# Patient Record
Sex: Female | Born: 1960
Health system: Southern US, Community
[De-identification: ages and names within clinical notes are randomized; demographics above are authoritative.]

## PROBLEM LIST (undated history)

## (undated) DIAGNOSIS — I639 Cerebral infarction, unspecified: Secondary | ICD-10-CM

## (undated) DIAGNOSIS — S065X9A Traumatic subdural hemorrhage with loss of consciousness of unspecified duration, initial encounter: Secondary | ICD-10-CM

## (undated) DIAGNOSIS — I69322 Dysarthria following cerebral infarction: Secondary | ICD-10-CM

## (undated) DIAGNOSIS — Z85118 Personal history of other malignant neoplasm of bronchus and lung: Secondary | ICD-10-CM

## (undated) DIAGNOSIS — E785 Hyperlipidemia, unspecified: Secondary | ICD-10-CM

## (undated) DIAGNOSIS — Z85841 Personal history of malignant neoplasm of brain: Secondary | ICD-10-CM

## (undated) DIAGNOSIS — C801 Malignant (primary) neoplasm, unspecified: Secondary | ICD-10-CM

## (undated) DIAGNOSIS — Z87898 Personal history of other specified conditions: Secondary | ICD-10-CM

## (undated) DIAGNOSIS — I69398 Other sequelae of cerebral infarction: Secondary | ICD-10-CM

## (undated) DIAGNOSIS — G4730 Sleep apnea, unspecified: Secondary | ICD-10-CM

## (undated) DIAGNOSIS — Z8719 Personal history of other diseases of the digestive system: Secondary | ICD-10-CM

## (undated) DIAGNOSIS — R569 Unspecified convulsions: Secondary | ICD-10-CM

## (undated) DIAGNOSIS — I10 Essential (primary) hypertension: Secondary | ICD-10-CM

## (undated) DIAGNOSIS — I5189 Other ill-defined heart diseases: Secondary | ICD-10-CM

## (undated) DIAGNOSIS — Z8553 Personal history of malignant neoplasm of renal pelvis: Secondary | ICD-10-CM

## (undated) DIAGNOSIS — R269 Unspecified abnormalities of gait and mobility: Secondary | ICD-10-CM

## (undated) HISTORY — DX: Hyperlipidemia, unspecified: E78.5

## (undated) HISTORY — DX: Sleep apnea, unspecified: G47.30

## (undated) HISTORY — DX: Unspecified abnormalities of gait and mobility: R26.9

## (undated) HISTORY — PX: KIDNEY SURGERY: SHX687

## (undated) HISTORY — PX: LEG SURGERY: SHX1003

## (undated) HISTORY — DX: Personal history of malignant neoplasm of renal pelvis: Z85.53

## (undated) HISTORY — DX: Dysarthria following cerebral infarction: I69.322

## (undated) HISTORY — PX: BRAIN SURGERY: SHX531

## (undated) HISTORY — DX: Personal history of malignant neoplasm of brain: Z85.841

## (undated) HISTORY — DX: Cerebral infarction, unspecified: I63.9

## (undated) HISTORY — PX: OTHER SURGICAL HISTORY: SHX169

## (undated) HISTORY — DX: Other sequelae of cerebral infarction: I69.398

## (undated) HISTORY — DX: Malignant (primary) neoplasm, unspecified: C80.1

## (undated) HISTORY — DX: Personal history of other malignant neoplasm of bronchus and lung: Z85.118

## (undated) HISTORY — DX: Other ill-defined heart diseases: I51.89

## (undated) HISTORY — DX: Essential (primary) hypertension: I10

## (undated) HISTORY — DX: Personal history of other specified conditions: Z87.898

## (undated) HISTORY — DX: Traumatic subdural hemorrhage with loss of consciousness of unspecified duration, initial encounter: S06.5X9A

---
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
## 1996-10-21 DIAGNOSIS — Z8553 Personal history of malignant neoplasm of renal pelvis: Secondary | ICD-10-CM

## 1996-10-21 HISTORY — DX: Personal history of malignant neoplasm of renal pelvis: Z85.53

## 2000-10-21 DIAGNOSIS — Z87898 Personal history of other specified conditions: Secondary | ICD-10-CM

## 2000-10-21 DIAGNOSIS — Z8589 Personal history of malignant neoplasm of other organs and systems: Secondary | ICD-10-CM

## 2000-10-21 HISTORY — PX: LUNG LOBECTOMY: SHX167

## 2000-10-21 HISTORY — DX: Personal history of other specified conditions: Z87.898

## 2000-10-21 HISTORY — DX: Personal history of malignant neoplasm of other organs and systems: Z85.89

## 2001-10-21 DIAGNOSIS — Z85841 Personal history of malignant neoplasm of brain: Secondary | ICD-10-CM

## 2001-10-21 HISTORY — DX: Personal history of malignant neoplasm of brain: Z85.841

## 2011-10-07 LAB — HM PAP SMEAR: HM Pap smear: NORMAL

## 2011-12-03 LAB — HM MAMMOGRAPHY

## 2012-08-04 LAB — HM DEXA SCAN

## 2012-11-06 DIAGNOSIS — C7931 Secondary malignant neoplasm of brain: Secondary | ICD-10-CM | POA: Insufficient documentation

## 2012-11-06 DIAGNOSIS — C7800 Secondary malignant neoplasm of unspecified lung: Secondary | ICD-10-CM | POA: Insufficient documentation

## 2012-11-06 DIAGNOSIS — C659 Malignant neoplasm of unspecified renal pelvis: Secondary | ICD-10-CM | POA: Insufficient documentation

## 2014-01-25 LAB — HM MAMMOGRAPHY

## 2014-04-20 DIAGNOSIS — S065X9A Traumatic subdural hemorrhage with loss of consciousness of unspecified duration, initial encounter: Secondary | ICD-10-CM

## 2014-04-20 DIAGNOSIS — S065XAA Traumatic subdural hemorrhage with loss of consciousness status unknown, initial encounter: Secondary | ICD-10-CM

## 2014-04-20 HISTORY — DX: Traumatic subdural hemorrhage with loss of consciousness of unspecified duration, initial encounter: S06.5X9A

## 2014-04-20 HISTORY — DX: Traumatic subdural hemorrhage with loss of consciousness status unknown, initial encounter: S06.5XAA

## 2014-05-16 DIAGNOSIS — S065XAA Traumatic subdural hemorrhage with loss of consciousness status unknown, initial encounter: Secondary | ICD-10-CM | POA: Insufficient documentation

## 2014-10-21 DIAGNOSIS — R569 Unspecified convulsions: Secondary | ICD-10-CM

## 2014-10-21 HISTORY — DX: Unspecified convulsions: R56.9

## 2015-02-16 LAB — BASIC METABOLIC PANEL
Creatinine: 0.9 mg/dL (ref 0.5–1.1)
Glucose: 95 mg/dL
Potassium: 4.8 mmol/L (ref 3.4–5.3)
Sodium: 146 mmol/L (ref 137–147)

## 2015-02-16 LAB — CBC AND DIFFERENTIAL
Hemoglobin: 12.7 g/dL (ref 12.0–16.0)
Platelets: 247 10*3/uL (ref 150–399)
WBC: 4.6 10^3/mL

## 2015-02-16 LAB — LIPID PANEL
Cholesterol: 287 mg/dL — AB (ref 0–200)
HDL: 80 mg/dL — AB (ref 35–70)
LDL Cholesterol: 185 mg/dL
Triglycerides: 108 mg/dL (ref 40–160)

## 2015-02-16 LAB — HEPATIC FUNCTION PANEL
ALT: 24 U/L (ref 7–35)
AST: 21 U/L (ref 13–35)

## 2015-03-22 DIAGNOSIS — I639 Cerebral infarction, unspecified: Secondary | ICD-10-CM

## 2015-03-22 HISTORY — DX: Cerebral infarction, unspecified: I63.9

## 2015-04-21 ENCOUNTER — Encounter: Payer: Self-pay | Admitting: Family Medicine

## 2015-04-21 ENCOUNTER — Ambulatory Visit (INDEPENDENT_AMBULATORY_CARE_PROVIDER_SITE_OTHER): Payer: BLUE CROSS/BLUE SHIELD | Admitting: Family Medicine

## 2015-04-21 VITALS — BP 105/69 | HR 103 | Ht 66.0 in | Wt 156.0 lb

## 2015-04-21 DIAGNOSIS — E854 Organ-limited amyloidosis: Secondary | ICD-10-CM | POA: Insufficient documentation

## 2015-04-21 DIAGNOSIS — G4733 Obstructive sleep apnea (adult) (pediatric): Secondary | ICD-10-CM

## 2015-04-21 DIAGNOSIS — Z8679 Personal history of other diseases of the circulatory system: Secondary | ICD-10-CM | POA: Insufficient documentation

## 2015-04-21 DIAGNOSIS — G40909 Epilepsy, unspecified, not intractable, without status epilepticus: Secondary | ICD-10-CM | POA: Insufficient documentation

## 2015-04-21 DIAGNOSIS — S065X9A Traumatic subdural hemorrhage with loss of consciousness of unspecified duration, initial encounter: Secondary | ICD-10-CM

## 2015-04-21 DIAGNOSIS — S065XAA Traumatic subdural hemorrhage with loss of consciousness status unknown, initial encounter: Secondary | ICD-10-CM

## 2015-04-21 DIAGNOSIS — G40509 Epileptic seizures related to external causes, not intractable, without status epilepticus: Secondary | ICD-10-CM

## 2015-04-21 DIAGNOSIS — I639 Cerebral infarction, unspecified: Secondary | ICD-10-CM

## 2015-04-21 DIAGNOSIS — I6200 Nontraumatic subdural hemorrhage, unspecified: Secondary | ICD-10-CM

## 2015-04-21 DIAGNOSIS — I680 Cerebral amyloid angiopathy: Secondary | ICD-10-CM | POA: Diagnosis not present

## 2015-04-21 DIAGNOSIS — C688 Malignant neoplasm of overlapping sites of urinary organs: Secondary | ICD-10-CM

## 2015-04-21 DIAGNOSIS — E785 Hyperlipidemia, unspecified: Secondary | ICD-10-CM

## 2015-04-21 DIAGNOSIS — C689 Malignant neoplasm of urinary organ, unspecified: Secondary | ICD-10-CM

## 2015-04-21 DIAGNOSIS — Z8669 Personal history of other diseases of the nervous system and sense organs: Secondary | ICD-10-CM | POA: Insufficient documentation

## 2015-04-21 NOTE — Progress Notes (Signed)
CC: Jessica Ritter is a 54 y.o. female is here for hospital f/u   Subjective: HPI:  Very pleasant 54 year old here to [facility] follow-up for CVA infarction that occurred last month. On MRI she was found to have amyloid angiopathy which puts her at risk for both bleeding and infarctions. She was started on a low-dose of aspirin and recommended by her neurologist to be on no stronger anticoagulation due to this angiopathy. LDL cholesterol was considerably elevated and she was started on 80 mg of atorvastatin, she was not on any cholesterol medication prior to this. She and her husband both believe that she is back to her baseline prior to the CVA and that new motor and sensory disturbances resolved by the day of discharge. Carotid ultrasound was unremarkable and echocardiogram showed no PFO.  She has unfortunate history of a subdural hematoma after a fall in 2015. She's been left with epilepsy following this hematoma that is currently well controlled on Keppra. She is currently getting speech therapy, occupational therapy and physical therapy. Family believes that she is progressing however very slowly. She is independent in ADLs but requires her husband supervision around the clock. Ever since this hematoma she's been left with slow speech, slow cognition, and balance difficulty.  When admitted for her recent stroke she was found to have a AHI of 13. Her hospitalist arrange an outpatient sleep study which is still pending to be scheduled.  Hyperlipidemia: She is currently taking atorvastatin 80 mg on a daily basis without any right upper quadrant pain or myalgias.  Around 2004 she was found to have urothelial cancer that had metastasized to her lungs and her brain. She currently sees her oncologist on a annual basis. She's had no sign of recurrence for the past 14 years. Denies any urinary complaints other than occasional urinary incontinence when sneezing forcefully.  Review of  Systems - General ROS: negative for - chills, fever, night sweats, weight gain or weight loss Ophthalmic ROS: negative for - decreased vision Psychological ROS: negative for - anxiety or depression ENT ROS: negative for - hearing change, nasal congestion, tinnitus or allergies Hematological and Lymphatic ROS: negative for - bleeding problems, bruising or swollen lymph nodes Breast ROS: negative Respiratory ROS: no cough, shortness of breath, or wheezing Cardiovascular ROS: no chest pain or dyspnea on exertion Gastrointestinal ROS: no abdominal pain, change in bowel habits, or black or bloody stools Genito-Urinary ROS: negative for - genital discharge, genital ulcers, incontinence or abnormal bleeding from genitals Musculoskeletal ROS: negative for - joint pain or muscle pain Neurological ROS: negative for - headaches or memory loss Dermatological ROS: negative for lumps, mole changes, rash and skin lesion changes  Past Medical History  Diagnosis Date  . Hyperlipidemia     Past Surgical History  Procedure Laterality Date  . Leg surgery    . Kidney surgery    . Brain tumor surgery     Family History  Problem Relation Age of Onset  . Cancer      mother     History   Social History  . Marital Status: Married    Spouse Name: N/A  . Number of Children: N/A  . Years of Education: N/A   Occupational History  . Not on file.   Social History Main Topics  . Smoking status: Never Smoker   . Smokeless tobacco: Not on file  . Alcohol Use: 1.2 oz/week    2 Standard drinks or equivalent per week  . Drug  Use: No  . Sexual Activity: No   Other Topics Concern  . Not on file   Social History Narrative  . No narrative on file     Objective: BP 105/69 mmHg  Pulse 103  Ht 5\' 6"  (1.676 m)  Wt 156 lb (70.761 kg)  BMI 25.19 kg/m2  General: Alert and Oriented, No Acute Distress HEENT: Pupils equal, round, reactive to light. Conjunctivae clear.  Moist mucous membranes pharynx  unremarkable Lungs: Clear to auscultation bilaterally, no wheezing/ronchi/rales.  Comfortable work of breathing. Good air movement. Cardiac: Regular rate and rhythm. Normal S1/S2.  No murmurs, rubs, nor gallops.  No carotid bruit Abdomen: Soft nontender Extremities: No peripheral edema.  Strong peripheral pulses.  Mental Status: No depression, anxiety, nor agitation. Skin: Warm and dry.  Assessment & Plan: Jessica Ritter was seen today for hospital f/u.  Diagnoses and all orders for this visit:  Cerebral amyloid angiopathy  Subdural hematoma  Epileptic seizures due to external causes, not intractable, without status epilepticus  OSA (obstructive sleep apnea)  Hyperlipidemia  Cerebral infarction due to unspecified mechanism  Urothelial cancer   Cerebral amyloid angiopathy: Agree with 81 mg dose of aspirin especially in the setting of a recent ischemic stroke. This will be further managed by Dr. Lynnette Caffey her neurologist Subdural hematoma: Resolved and currently getting appropriate rehabilitation, agree that it's far enough out to start aspirin given recent ischemic stroke. Seizures: Controlled on Keppra OSA: Pending sleep study, offered to order this if she does not get word of scheduling through [place] within the next couple weeks. Hyperlipidemia: Agree with high-dose atorvastatin, I be happy to recheck cholesterol in 2-1/2 months. Original LDL was 174 Urothelial cancer: Currently managed by her oncologist. Discussed Keagle exercises to help with stress incontinence   Return in about 3 months (around 07/22/2015) for Cholesterol.

## 2015-05-09 ENCOUNTER — Encounter: Payer: Self-pay | Admitting: Family Medicine

## 2015-05-09 DIAGNOSIS — M8580 Other specified disorders of bone density and structure, unspecified site: Secondary | ICD-10-CM | POA: Insufficient documentation

## 2015-05-10 ENCOUNTER — Telehealth: Payer: Self-pay | Admitting: Family Medicine

## 2015-05-10 NOTE — Telephone Encounter (Signed)
Patient is not being followed by the doctor that originally prescribed Paxil 10 mg so she needs a refill sent over.   [facility] nurse at [facility].  thanks

## 2015-05-11 MED ORDER — PAROXETINE HCL 10 MG PO TABS: 10.0000 mg | ORAL_TABLET | Freq: Every day | ORAL | Status: DC

## 2015-05-11 NOTE — Telephone Encounter (Signed)
Dr Ileene Rubens is out the rest of this week. Sent 30 day to pharmacy.

## 2015-05-12 ENCOUNTER — Other Ambulatory Visit: Payer: Self-pay

## 2015-05-12 MED ORDER — PANTOPRAZOLE SODIUM 40 MG PO TBEC: 40.0000 mg | DELAYED_RELEASE_TABLET | Freq: Every day | ORAL | Status: DC

## 2015-05-16 ENCOUNTER — Encounter: Payer: Self-pay | Admitting: Family Medicine

## 2015-05-17 ENCOUNTER — Encounter: Payer: Self-pay | Admitting: *Deleted

## 2015-05-17 ENCOUNTER — Encounter: Payer: Self-pay | Admitting: Family Medicine

## 2015-05-24 ENCOUNTER — Encounter: Payer: Self-pay | Admitting: *Deleted

## 2015-06-02 ENCOUNTER — Telehealth: Payer: Self-pay | Admitting: *Deleted

## 2015-06-02 NOTE — Telephone Encounter (Signed)
[name], a PT from [facility], left vm wanting to know if she could have a verbal order to extend PT for 3 more weeks.  Please advise.

## 2015-06-02 NOTE — Telephone Encounter (Signed)
Amber, Yes of course, will you please extend PT for another three weeks.

## 2015-06-02 NOTE — Telephone Encounter (Signed)
VO given to [place] from [facility] to extend PT for 3 more weeks.

## 2015-06-08 ENCOUNTER — Other Ambulatory Visit: Payer: Self-pay | Admitting: Family Medicine

## 2015-06-20 ENCOUNTER — Encounter: Payer: Self-pay | Admitting: Family Medicine

## 2015-06-20 ENCOUNTER — Ambulatory Visit (INDEPENDENT_AMBULATORY_CARE_PROVIDER_SITE_OTHER): Payer: BLUE CROSS/BLUE SHIELD | Admitting: Family Medicine

## 2015-06-20 ENCOUNTER — Telehealth: Payer: Self-pay | Admitting: *Deleted

## 2015-06-20 VITALS — BP 128/88 | HR 108 | Wt 154.0 lb

## 2015-06-20 DIAGNOSIS — R4182 Altered mental status, unspecified: Secondary | ICD-10-CM | POA: Diagnosis not present

## 2015-06-20 NOTE — Progress Notes (Signed)
CC: Jessica Ritter is a 54 y.o. female is here for confusion and mobility regression   Subjective: HPI:  Husband, patient, physical therapist has noticed that the patient has been having periods of progression with respect to physical therapy. Twice now she has been able to walk with a normal gait for matter weeks only to all of a sudden regress to a shuffling gait. She's also been acting a little bit confused when around her husband. For instance she is not recognizing familiar locations. Did not recognize her own house in the last week. Patient has been acting more forgetful. Patient denies any pain or any memory issues. There's been no new motor or sensory disturbances. Patient was started on Paxil after her stroke sometime around July. She was not experiencing any depression or anxiety. The family wonders if she actually needs to be on this medication. There's been no fevers, chills, shortness of breath, cough, abdominal pain, falls, diarrhea or genitourinary complaints. Symptoms are present on a daily basis at least a mild degree.   Review Of Systems Outlined In HPI  Past Medical History  Diagnosis Date  . Hyperlipidemia     Past Surgical History  Procedure Laterality Date  . Leg surgery    . Kidney surgery    . Brain tumor surgery     Family History  Problem Relation Age of Onset  . Cancer      mother     Social History   Social History  . Marital Status: Married    Spouse Name: N/A  . Number of Children: N/A  . Years of Education: N/A   Occupational History  . Not on file.   Social History Main Topics  . Smoking status: Never Smoker   . Smokeless tobacco: Not on file  . Alcohol Use: 1.2 oz/week    2 Standard drinks or equivalent per week  . Drug Use: No  . Sexual Activity: No   Other Topics Concern  . Not on file   Social History Narrative     Objective: BP 128/88 mmHg  Pulse 108  Wt 154 lb (69.854 kg)  General: Alert and Oriented, No Acute  Distress HEENT: Pupils equal, round, reactive to light. Conjunctivae clear.  Moist mucous membranes Neuro: CN II-XII grossly intact, full strength/rom of all four extremities, C5/L4/S1 DTRs 2/4 bilaterally, mild shuffling gait, rapid alternating movements normal, heel-shin test normal, Rhomberg normal. Lungs: Clear to auscultation bilaterally, no wheezing/ronchi/rales.  Comfortable work of breathing. Good air movement. Cardiac: Regular rate and rhythm. Normal S1/S2.  No murmurs, rubs, nor gallops.   Extremities: No peripheral edema.  Strong peripheral pulses.  Mental Status: No depression, anxiety, nor agitation. Skin: Warm and dry.  Assessment & Plan: Jessica Ritter was seen today for confusion and mobility regression.  Diagnoses and all orders for this visit:  Altered mental status, unspecified altered mental status type -     Urinalysis, Routine w reflex microscopic -     Urine culture -     COMPLETE METABOLIC PANEL WITH GFR -     TSH   Altered mental status: Rule out UTI, metabolic abnormality or thyroid dysfunction. Stopping Paxil. Will call tomorrow with further recommendations.  Return if symptoms worsen or fail to improve.

## 2015-06-20 NOTE — Telephone Encounter (Signed)
Pt Jessica Ritter from [facility]. She states she had been doing Pt with her and it was going well however over the past few days she has regressed. She states she is having intermittent confusion. Also regression in functional mobility. Neurologist preferred her to been seen by PCP first. PT does not  [name] would like her to be evaluated. Pt does have an appt on Oct 3 but she feels she needs to be seen sooner. Pt did not want to push her if there was an underlying issue. Husband just walked in the office and was informed by Luellen Pucker that pt needs an appt and to see if a sooner appt could be scheduled. Please advise if you have any other advice in the meant time of if you feel something else she be done more immediate

## 2015-06-21 LAB — URINALYSIS, MICROSCOPIC ONLY
Bacteria, UA: NONE SEEN [HPF]
Casts: NONE SEEN [LPF]
Crystals: NONE SEEN [HPF]
Squamous Epithelial / LPF: NONE SEEN [HPF] (ref <=5)
WBC, UA: NONE SEEN WBC/HPF (ref <=5)
Yeast: NONE SEEN [HPF]

## 2015-06-21 LAB — URINALYSIS, ROUTINE W REFLEX MICROSCOPIC
Bilirubin Urine: NEGATIVE
Glucose, UA: NEGATIVE
Ketones, ur: NEGATIVE
Leukocytes, UA: NEGATIVE
Nitrite: NEGATIVE
Protein, ur: NEGATIVE
Specific Gravity, Urine: 1.021 (ref 1.001–1.035)
pH: 5 (ref 5.0–8.0)

## 2015-06-21 LAB — TSH

## 2015-06-22 LAB — URINE CULTURE
Colony Count: NO GROWTH
Organism ID, Bacteria: NO GROWTH

## 2015-07-05 ENCOUNTER — Telehealth: Payer: Self-pay | Admitting: *Deleted

## 2015-07-05 MED ORDER — LEVETIRACETAM 750 MG PO TABS: 750.0000 mg | ORAL_TABLET | Freq: Two times a day (BID) | ORAL | Status: DC

## 2015-07-05 NOTE — Telephone Encounter (Signed)
[facility] sent to the [facility] here in [place].

## 2015-07-05 NOTE — Telephone Encounter (Signed)
Received a refill request from [facility] for Keppra. Have to route since this has not been rx'ed by this office yet

## 2015-07-07 ENCOUNTER — Telehealth: Payer: Self-pay | Admitting: *Deleted

## 2015-07-07 NOTE — Telephone Encounter (Signed)
Jessica Ritter from [facility] left a message stating she feels pt needs more specific neurological rehab than what she can provide in the home. Pt has a very high copay with home health and the physical therapist   feels she is not benefiting them as much any more. Jessica Ritter feels the pt would benefit more from an outpatient neurologial

## 2015-07-11 NOTE — Telephone Encounter (Signed)
[name], I'm not aware of any local neuro-rehab locations, can you please call our PT office next door and see if they know of any locations [place]?

## 2015-07-12 NOTE — Telephone Encounter (Signed)
Jessica Ritter in PT is not aware... She says Comp Rehab in [place] may be option.  Called [name] with [facility] and left a message on her vm. [phone]

## 2015-07-17 NOTE — Telephone Encounter (Signed)
Called and left a message for Mr. Jessica Ritter to let us know if any neurological outpatient rehab has been set up for pt. I have not heard back from [facility] and she had mentioned in previous conversation that she did know of some rehab places in the area

## 2015-07-24 ENCOUNTER — Encounter: Payer: Self-pay | Admitting: Family Medicine

## 2015-07-24 ENCOUNTER — Other Ambulatory Visit: Payer: Self-pay | Admitting: Family Medicine

## 2015-07-24 ENCOUNTER — Ambulatory Visit (INDEPENDENT_AMBULATORY_CARE_PROVIDER_SITE_OTHER): Payer: BLUE CROSS/BLUE SHIELD

## 2015-07-24 ENCOUNTER — Ambulatory Visit (INDEPENDENT_AMBULATORY_CARE_PROVIDER_SITE_OTHER): Payer: BLUE CROSS/BLUE SHIELD | Admitting: Family Medicine

## 2015-07-24 VITALS — BP 119/62 | HR 76 | Wt 159.0 lb

## 2015-07-24 DIAGNOSIS — Z8673 Personal history of transient ischemic attack (TIA), and cerebral infarction without residual deficits: Secondary | ICD-10-CM

## 2015-07-24 DIAGNOSIS — I639 Cerebral infarction, unspecified: Secondary | ICD-10-CM | POA: Diagnosis not present

## 2015-07-24 DIAGNOSIS — M25512 Pain in left shoulder: Secondary | ICD-10-CM

## 2015-07-24 DIAGNOSIS — E785 Hyperlipidemia, unspecified: Secondary | ICD-10-CM

## 2015-07-24 NOTE — Progress Notes (Signed)
CC: Jessica Ritter is a 54 y.o. female is here for Follow-up   Subjective: HPI:  Left shoulder pain for the last 2 or 3 weeks on a daily basis located in the anterior aspect of the shoulder and nonradiating. Symptoms are worse with any elevation of the arm beyond 90 of abduction or flexion. Symptoms started soon after doing strenuous exercises with her physical therapist. She would like to continue pursuing physical therapist but no longer with advanced home care. Nothing else seems to make symptoms better or worse other than that described above.  She also believes that her gait is getting a little bit better but they've reached a plateau. Developing a second opinion on treatment with a different physical therapist.  Since stopping Paxil she's noticed that she is much more clear headed, does not feel sluggish and overall just feels better without the medication. She denies any anxiety or depression.  It's been about 3 months now since she's been on atorvastatin. She denies any known side effects. She's not had her cholesterol checked since starting this medication. Denies right upper quadrant pain or myalgias   Review Of Systems Outlined In HPI  Past Medical History  Diagnosis Date  . Hyperlipidemia     Past Surgical History  Procedure Laterality Date  . Leg surgery    . Kidney surgery    . Brain tumor surgery     Family History  Problem Relation Age of Onset  . Cancer      mother     Social History   Social History  . Marital Status: Married    Spouse Name: N/A  . Number of Children: N/A  . Years of Education: N/A   Occupational History  . Not on file.   Social History Main Topics  . Smoking status: Never Smoker   . Smokeless tobacco: Not on file  . Alcohol Use: 1.2 oz/week    2 Standard drinks or equivalent per week  . Drug Use: No  . Sexual Activity: No   Other Topics Concern  . Not on file   Social History Narrative     Objective: BP 119/62 mmHg  Pulse  76  Wt 159 lb (72.122 kg)  General: Alert and Oriented, No Acute Distress HEENT: Pupils equal, round, reactive to light. Conjunctivae clear.  Moist mucous membranes Lungs: Clear to auscultation bilaterally, no wheezing/ronchi/rales.  Comfortable work of breathing. Good air movement. Cardiac: Regular rate and rhythm. Normal S1/S2.  No murmurs, rubs, nor gallops.   Left shoulder exam reveals full range of motion and strength in all planes of motion and with individual rotator cuff testing. No overlying redness warmth or swelling.  Neer's test negative.  Hawkins test positive. Empty can positive. Crossarm test negative. O'Brien's test negative. Apprehension test negative. Speed's test negative. Extremities: No peripheral edema.  Strong peripheral pulses.  Mental Status: No depression, anxiety, nor agitation. Skin: Warm and dry.  Assessment & Plan: Jessica Ritter was seen today for follow-up.  Diagnoses and all orders for this visit:  Cerebral infarction due to unspecified mechanism -     Lipid panel -     COMPLETE METABOLIC PANEL WITH GFR -     Ambulatory referral to Home Health  Hyperlipidemia -     Lipid panel -     COMPLETE METABOLIC PANEL WITH GFR  Left shoulder pain -     DG Shoulder Left; Future -     Ambulatory referral to Home Health   Hyperlipidemia: Due for repeat  lipid panel and liver function. Continue atorvastatin pending results. Left shoulder pain: Obtain x-ray to ensure that it safe for her to continue doing physical therapy. I suspect that this is coming from a rotator cuff tear or tendinitis, hold off on exercise until x-rays are back, will arrange physical therapy with another home health agency if x-ray is reassuring.  25 minutes spent face-to-face during visit today of which at least 50% was counseling or coordinating care regarding: 1. Cerebral infarction due to unspecified mechanism   2. Hyperlipidemia   3. Left shoulder pain      Return in about 3 months (around  10/24/2015).

## 2015-08-03 ENCOUNTER — Telehealth: Payer: Self-pay

## 2015-08-03 ENCOUNTER — Other Ambulatory Visit: Payer: Self-pay

## 2015-08-03 DIAGNOSIS — M25512 Pain in left shoulder: Secondary | ICD-10-CM

## 2015-08-03 DIAGNOSIS — I639 Cerebral infarction, unspecified: Secondary | ICD-10-CM

## 2015-08-03 NOTE — Telephone Encounter (Signed)
Jessica Ritter's husband called office stating that Jessica Ritter is still having pain in the arm.  He also called wanting to know her lab results.

## 2015-08-03 NOTE — Telephone Encounter (Signed)
error 

## 2015-08-06 NOTE — Telephone Encounter (Signed)
[name] or [name], Will you please contact [facility] to see why lab results are not available as of 08/06/2015 and update the me and the patient's husband.  Will you also make sure that the patient/family is aware that [facility] home health will be helping with physical therapy on Monday for her continued arm pain.  For future reference please check the Epic "Labs" tab under "Chart Review" and the "Referrals" tab under "Chart Review" to help with similar patient questions.

## 2015-08-07 NOTE — Telephone Encounter (Signed)
[name], Will you please let Ms. Milles know that her labs are not showing up in our EMR however I got a paper copy of her results which are very reassuring.  Her thyroid function, liver function, kidney function, and blood sugar were normal.  Most importantly her cholesterol looks amazingly good.   I'd recommend continuing on the atorvastatin cholesterol lowering medication.

## 2015-08-07 NOTE — Telephone Encounter (Signed)
Left voicemail for patient to call back for lab results.

## 2015-08-10 NOTE — Telephone Encounter (Signed)
Jessica Ritter, Referral has been placed.

## 2015-08-10 NOTE — Addendum Note (Signed)
Addended by: Marcial Pacas on: 08/10/2015 02:09 PM   Modules accepted: Orders

## 2015-08-10 NOTE — Telephone Encounter (Signed)
Pt's husband, Jessica Ritter, called and was advised of results. Husband does state the home PT advised him they would not be able to do the same type of exercises as she would be able to do in office. Would like a referral placed for PT in our [place] [facility] location. Pt husband states he would be able to take her to/from therapy.

## 2015-08-21 ENCOUNTER — Ambulatory Visit: Payer: BLUE CROSS/BLUE SHIELD | Admitting: Rehabilitative and Restorative Service Providers"

## 2015-08-23 ENCOUNTER — Encounter: Payer: Self-pay | Admitting: Rehabilitative and Restorative Service Providers"

## 2015-08-23 ENCOUNTER — Ambulatory Visit (INDEPENDENT_AMBULATORY_CARE_PROVIDER_SITE_OTHER): Payer: BLUE CROSS/BLUE SHIELD | Admitting: Rehabilitative and Restorative Service Providers"

## 2015-08-23 DIAGNOSIS — R531 Weakness: Secondary | ICD-10-CM

## 2015-08-23 DIAGNOSIS — R6889 Other general symptoms and signs: Secondary | ICD-10-CM

## 2015-08-23 DIAGNOSIS — M2560 Stiffness of unspecified joint, not elsewhere classified: Secondary | ICD-10-CM

## 2015-08-23 DIAGNOSIS — M6289 Other specified disorders of muscle: Secondary | ICD-10-CM

## 2015-08-23 DIAGNOSIS — R293 Abnormal posture: Secondary | ICD-10-CM

## 2015-08-23 DIAGNOSIS — M25512 Pain in left shoulder: Secondary | ICD-10-CM | POA: Diagnosis not present

## 2015-08-23 DIAGNOSIS — Z7409 Other reduced mobility: Secondary | ICD-10-CM

## 2015-08-23 DIAGNOSIS — M623 Immobility syndrome (paraplegic): Secondary | ICD-10-CM | POA: Diagnosis not present

## 2015-08-23 NOTE — Therapy (Signed)
[place], [place], [address] Phone: [phone]   Fax:  [phone]  Physical Therapy Evaluation  Patient Details  Name: Jessica Ritter MRN: [id number] Date of Birth: 01-Sep-1961 Referring Provider: Dr. Marcial Pacas  Encounter Date: 08/23/2015      PT End of Session - 08/23/15 1238    Visit Number 1   Number of Visits 6   Date for PT Re-Evaluation 10/04/15   PT Start Time 0849   PT Stop Time 1004   PT Time Calculation (min) 75 min   Activity Tolerance Patient limited by pain;Patient tolerated treatment well      Past Medical History  Diagnosis Date  . Hyperlipidemia     Past Surgical History  Procedure Laterality Date  . Leg surgery    . Kidney surgery    . Brain tumor surgery      There were no vitals filed for this visit.  Visit Diagnosis:  Shoulder pain, left - Plan: PT plan of care cert/re-cert  Weakness of left side of body - Plan: PT plan of care cert/re-cert  Stiffness due to immobility - Plan: PT plan of care cert/re-cert  Abnormal posture - Plan: PT plan of care cert/re-cert  Decreased strength, endurance, and mobility - Plan: PT plan of care cert/re-cert      Subjective Assessment - 08/23/15 0848    Subjective Jazelle reports that she has pain and limitations in moving Lt arm. Pt had CVA 06/16 and fell ~ 3-4 weeks after the CVA ~ 05/01/15, landing on Lt shoulder striking the coffee table. She continued with PT with increased pain in the shoulder as she continued to work with shoulder.    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 no limit   How long can you stand comfortably? no problem with shoulder   How long can you walk comfortably?  no problem with shoulder   Diagnostic tests xray - suggest possible rotator cuff injury   Patient Stated Goals get rid of shoulder pain    Currently in Pain? Yes   Pain Score 5    Pain Location Shoulder   Pain Orientation Left   Pain Descriptors / Indicators Nagging  sharp at times   Pain Type Acute pain   Pain Onset More than a month ago   Pain Frequency Intermittent   Aggravating Factors  lifting arm; using Lt arm; lying down to sleep   Pain Relieving Factors ice   Effect of Pain on Daily Activities limits functional use of Lt UE            [facility] PT Assessment - 08/23/15 0001    Assessment   Medical Diagnosis Cerebral infarction   Referring Provider Dr. Marcial Pacas   Onset Date/Surgical Date 05/01/15   Hand Dominance Right   Next MD Visit not scheduled   Prior Therapy hospital and home health   Precautions   Precautions None   Balance Screen   Has the patient fallen in the past 6 months Yes   How many times? 1   Has the patient had a decrease in activity level because of a fear of falling?  Yes   Is the patient reluctant to leave their home because  of a fear of falling?  No   Home Environment   Additional Comments single level home no steps into the home    Prior Function   Level of Independence Needs assistance with ADLs   Vocation Other (comment)   Leisure some light household; exercises some days; shopping; walking with husband at home alone for a few hours at a time while husband works   Observation/Other Assessments   Focus on Therapeutic Outcomes (FOTO)  69% limitation   Sensation   Additional Comments denies numbness or tingling Lt side   AROM   Right Shoulder Extension 62 Degrees   Right Shoulder Flexion 138 Degrees   Right Shoulder ABduction 148 Degrees   Right Shoulder Internal Rotation 38 Degrees   Right Shoulder External Rotation 92 Degrees   Left Shoulder Extension 41 Degrees  mild pain   Left Shoulder Flexion 37 Degrees  mild pain   Left  Shoulder ABduction 49 Degrees  pain   Left Shoulder Internal Rotation 17 Degrees  shd supported at ~ 70 deg - pain    Left Shoulder External Rotation 16 Degrees  with shd supported at ~70 deg abd some pain   Cervical Flexion 44   Cervical Extension 32   Cervical - Right Side Bend 31   Cervical - Left Side Bend 28   Cervical - Right Rotation 71   Cervical - Left Rotation 63                   [facility] Adult PT Treatment/Exercise - 08/23/15 0001    Therapeutic Activites    Therapeutic Activities --  education re positioning for Lt UE during sitting   Neuro Re-ed    Neuro Re-ed Details  working on posture and alignment; pulling shoulders down and back recruiting posterior shoulder girdle musculature   Shoulder Exercises: Standing   Retraction 10 reps;Both;Strengthening  10 sec hold with swim noodle   Other Standing Exercises chin tuck/chest lift postural work   Other Standing Exercises axial extension 10 sec hold 10 reps    Shoulder Exercises: Stretch   Table Stretch - Flexion 10 seconds  10 reps Lt   Cryotherapy   Number Minutes Cryotherapy 15 Minutes   Cryotherapy Location Shoulder   Type of Cryotherapy Ice pack   Electrical Stimulation   Electrical Stimulation Location Lt shoulder   Electrical Stimulation Action IFC   Electrical Stimulation Parameters to tolerance   Electrical Stimulation Goals Pain   Manual Therapy   Joint Mobilization gentle GH motion in sitting                 PT Education - 08/23/15 0835    Education provided Yes   Education Details HEP; posture and alignment   Person(s) Educated Patient;Spouse  husband, Jessica Ritter, took pictures of exercises   Methods Explanation;Demonstration;Tactile cues;Verbal cues;Handout   Comprehension Verbalized understanding;Returned demonstration;Verbal cues required;Tactile cues required             PT Long Term Goals - 08/23/15 1246    PT LONG TERM GOAL #1   Title Decrease pain to 0/10 to /10 for 80%  of day 10/04/15   Time 6   Period Weeks   Status New   PT LONG TERM GOAL #2   Title Increase AROM Lt shoudler to 90-100 deg elevation in flexion and scaption 10/04/15   Time 6   Period Weeks   Status New   PT LONG TERM GOAL #3   Title Patient will be able  to lift arm to top of her head with minimal pain/difficulty 10/04/15   Time 6   Period Weeks   Status New   PT LONG TERM GOAL #4   Title Improve FOTO to </= 38% limitation 10/04/15   Time 6   Period Weeks   Status New               Plan - 08/23/15 1239    Clinical Impression Statement Jessica Ritter present with Lt hemiparesis folowing CVA 06/16. She has limited ROM and strength Lt UE/LE with decresead functional activity level and pain with movement and at rest in Lt UE. She has poor posture and alignment; decresaed AROM; decresed strength; limited functional activity level; pain in Lt shoudler. Patient will benefit form PT to address problems identified and improve functional activity and indepedence. Focus will be on improving Lt UE function. Patient's symptoms are consistent with adhesive capsulitis Lt shoudler; Lt hemiparesis; shoulder sprain/strain. Patient would benefit form PT 2x/wk but her insurance coverage is limited and we will work with pt and her needs/goals ti maximize benefits with visit frequency as possible.    Pt will benefit from skilled therapeutic intervention in order to improve on the following deficits Pain;Postural dysfunction;Improper body mechanics;Decreased range of motion;Decreased mobility;Decreased strength;Decreased activity tolerance;Difficulty walking   Rehab Potential Good   PT Frequency 1x / week   PT Duration 6 weeks   PT Treatment/Interventions Patient/family education;ADLs/Self Care Home Management;Therapeutic exercise;Therapeutic activities;Passive range of motion;Manual techniques;Dry needling;Cryotherapy;Electrical Stimulation;Moist Heat;Ultrasound   PT Next Visit Plan myofacial ball release;  manual work; Lexicographer; exercise for home; pulley in clinic; progress with ROM/strengthening as tolerated    PT Home Exercise Plan postural correction; HEP for patient and husband Jessica Ritter and Agree with Plan of Care Patient         Problem List Patient Active Problem List   Diagnosis Date Noted  . Left shoulder pain 07/24/2015  . Osteopenia 05/09/2015  . Cerebral amyloid angiopathy 04/21/2015  . Subdural hematoma ([place]) 04/21/2015  . Epilepsy ([place]) 04/21/2015  . OSA (obstructive sleep apnea) 04/21/2015  . Hyperlipidemia 04/21/2015  . CVA (cerebral infarction) 2016 04/21/2015  . Urothelial cancer ([place]) 04/21/2015    Jessica Ritter Nilda Simmer PT, MPH 08/23/2015, 12:53 PM  [facility] [place], [place], [address] Phone: [phone]   Fax:  [phone]  Name: Jessica Ritter MRN: [id number] Date of Birth: 24-Nov-1960

## 2015-08-23 NOTE — Patient Instructions (Signed)
Axial Extension (Chin Tuck)    Pull chin in and lengthen back of neck. Hold __10__ seconds while counting out loud. Repeat __10__ times. Do __several__ sessions per day.  Scapular Retraction (Standing)    With arms at sides, pinch shoulder blades down and back. Hold 10 sec  Repeat __10__ times per set. Do __severa;__ sessions per day. Can use swim noodle along the spine  ROM: Flexion    Keeping left arm on table, slide body away until stretch is felt. Hold _10___ seconds. 10 reps  3-4 times/day

## 2015-08-31 ENCOUNTER — Ambulatory Visit (INDEPENDENT_AMBULATORY_CARE_PROVIDER_SITE_OTHER): Payer: BLUE CROSS/BLUE SHIELD | Admitting: Physical Therapy

## 2015-08-31 DIAGNOSIS — R6889 Other general symptoms and signs: Secondary | ICD-10-CM

## 2015-08-31 DIAGNOSIS — M623 Immobility syndrome (paraplegic): Secondary | ICD-10-CM

## 2015-08-31 DIAGNOSIS — Z7409 Other reduced mobility: Secondary | ICD-10-CM

## 2015-08-31 DIAGNOSIS — R531 Weakness: Secondary | ICD-10-CM

## 2015-08-31 DIAGNOSIS — R293 Abnormal posture: Secondary | ICD-10-CM | POA: Diagnosis not present

## 2015-08-31 DIAGNOSIS — M2560 Stiffness of unspecified joint, not elsewhere classified: Secondary | ICD-10-CM

## 2015-08-31 DIAGNOSIS — M25512 Pain in left shoulder: Secondary | ICD-10-CM

## 2015-08-31 DIAGNOSIS — M6289 Other specified disorders of muscle: Secondary | ICD-10-CM | POA: Diagnosis not present

## 2015-08-31 NOTE — Therapy (Signed)
[place], [place], [address] Phone: [phone]   Fax:  [phone]  Physical Therapy Treatment  Patient Details  Name: Jessica Ritter MRN: [id number] Date of Birth: 1960-10-23 Referring Provider: Dr. Marcial Pacas  Encounter Date: 08/31/2015      PT End of Session - 08/31/15 1706    Visit Number 2   Number of Visits 6   Date for PT Re-Evaluation 10/04/15   PT Start Time [id number]   PT Stop Time 1802   PT Time Calculation (min) 55 min   Activity Tolerance Patient limited by pain      Past Medical History  Diagnosis Date  . Hyperlipidemia     Past Surgical History  Procedure Laterality Date  . Leg surgery    . Kidney surgery    . Brain tumor surgery      There were no vitals filed for this visit.  Visit Diagnosis:  Shoulder pain, left  Weakness of left side of body  Stiffness due to immobility  Abnormal posture  Decreased strength, endurance, and mobility      Subjective Assessment - 08/31/15 1804    Subjective Lindee and her husband report they haven't had much chance to perform HEP as he has been out of town.  They requested to review.    Currently in Pain? No/denies  did have pain with weight bearing through her UE.                         [place] Adult PT Treatment/Exercise - 08/31/15 0001    Self-Care   Self-Care --  issued instruction on home TENS use and reviewed   Therapeutic Activites    Therapeutic Activities --  attempted to get pt prone - unable d/t shoulder pain    Shoulder Exercises: Supine   Other Supine Exercises 10 reps bridges, and TA contractions with pelvic tilt   Shoulder Exercises: Seated   Flexion 10 reps  sliding arm FWD on table   Shoulder Exercises: Standing   Retraction Strengthening;5 reps  10 sec holds with noodle behind back   Other Standing Exercises chin tuck/chest lift postural work   Shoulder Exercises: Pulleys   Flexion 1  minute  in pain free ROM   Shoulder Exercises: ROM/Strengthening   Other ROM/Strengthening Exercises prone leaning on ball, scap retraction and lawn mover pull   Shoulder Exercises: Isometric Strengthening   External Rotation --  10x5sec   Internal Rotation --  10x5 sec                PT Education - 08/31/15 1727    Education provided Yes   Education Details HEP, TENS    Person(s) Educated Patient;Spouse   Methods Demonstration;Handout   Comprehension Returned demonstration;Verbalized understanding             PT Long Term Goals - 08/31/15 1806    PT LONG TERM GOAL #1   Title Decrease pain to 0/10 to /10 for 80% of day 10/04/15   Status On-going   PT LONG TERM GOAL #2   Title Increase AROM Lt shoudler to 90-100 deg elevation in flexion and scaption 10/04/15   Status On-going   PT LONG TERM GOAL #3   Title Patient will be able to lift arm to top of her head with minimal pain/difficulty 10/04/15   Status On-going   PT LONG TERM GOAL #4   Title Improve FOTO to </= 38%  limitation 10/04/15   Status On-going               Plan - 08/31/15 1806    Clinical Impression Statement This is patients second visit, limited opportunity to perform HEP.  She tolerated isometric ER/IR shoulder with some relief of shoulder pain.  She continues to have forward flexed posture, assessed her hips and she has good extension to allow upright posture. Her core is very weak so she doesn't have proximal stability to allow good shoulder control with movement.    Pt will benefit from skilled therapeutic intervention in order to improve on the following deficits Pain;Postural dysfunction;Improper body mechanics;Decreased range of motion;Decreased mobility;Decreased strength;Decreased activity tolerance;Difficulty walking   Rehab Potential Good   PT Frequency 1x / week   PT Duration 6 weeks   PT Treatment/Interventions Patient/family education;ADLs/Self Care Home Management;Therapeutic  exercise;Therapeutic activities;Passive range of motion;Manual techniques;Dry needling;Cryotherapy;Electrical Stimulation;Moist Heat;Ultrasound   PT Next Visit Plan myofacial ball release; manual work; Lexicographer; exercise for home; pulley in clinic; progress with ROM/strengthening as tolerated    Consulted and Agree with Plan of Care Patient        Problem List Patient Active Problem List   Diagnosis Date Noted  . Left shoulder pain 07/24/2015  . Osteopenia 05/09/2015  . Cerebral amyloid angiopathy 04/21/2015  . Subdural hematoma ([place]) 04/21/2015  . Epilepsy ([place]) 04/21/2015  . OSA (obstructive sleep apnea) 04/21/2015  . Hyperlipidemia 04/21/2015  . CVA (cerebral infarction) 2016 04/21/2015  . Urothelial cancer ([place]) 04/21/2015    Jeral Pinch PT 08/31/2015, [place], [place], [address] Phone: [phone]   Fax:  [phone]  Name: Jessica Ritter MRN: [id number] Date of Birth: 04-Nov-1960

## 2015-08-31 NOTE — Patient Instructions (Addendum)
Internal Rotation (Isometric)    [place] [phone]    Place palm of right fist against door frame, with elbow bent. Press fist against door frame. Hold __5__ seconds. Repeat _10___ times. Do _1-2___ sessions per day.   Strengthening: Isometric External Rotation    Stand nice and tall. Using wall to provide resistance, and keeping right arm at side, press back of hand into ball using light pressure. Hold __5__ seconds. Repeat __10__ times per set. Do __1__ sets per session. Do _1-2___ sessions per day.  Copyright  VHI. All rights reserved.   Pelvic Tilt: Posterior - Legs Bent (Supine)    Tighten stomach and flatten back by rolling pelvis down. Hold __5__ seconds. Relax. Repeat _10___ times per set. Do __1-2__ sets per session. Do _1-2___ sessions per day.   Bridging    Slowly raise buttocks from floor, keeping stomach tight. Repeat __10__ times per set. Do __1-2__ sets per session. Do _1-2___ sessions per day.  http://orth.exer.us/1096   Copyright  VHI. All rights reserved.

## 2015-09-07 ENCOUNTER — Encounter: Payer: Self-pay | Admitting: Emergency Medicine

## 2015-09-07 ENCOUNTER — Ambulatory Visit (INDEPENDENT_AMBULATORY_CARE_PROVIDER_SITE_OTHER): Payer: BLUE CROSS/BLUE SHIELD | Admitting: Rehabilitative and Restorative Service Providers"

## 2015-09-07 ENCOUNTER — Encounter: Payer: Self-pay | Admitting: Rehabilitative and Restorative Service Providers"

## 2015-09-07 DIAGNOSIS — R293 Abnormal posture: Secondary | ICD-10-CM | POA: Diagnosis not present

## 2015-09-07 DIAGNOSIS — M623 Immobility syndrome (paraplegic): Secondary | ICD-10-CM

## 2015-09-07 DIAGNOSIS — M6289 Other specified disorders of muscle: Secondary | ICD-10-CM

## 2015-09-07 DIAGNOSIS — M25512 Pain in left shoulder: Secondary | ICD-10-CM

## 2015-09-07 DIAGNOSIS — Z7409 Other reduced mobility: Secondary | ICD-10-CM

## 2015-09-07 DIAGNOSIS — M2560 Stiffness of unspecified joint, not elsewhere classified: Secondary | ICD-10-CM

## 2015-09-07 DIAGNOSIS — R6889 Other general symptoms and signs: Secondary | ICD-10-CM

## 2015-09-07 DIAGNOSIS — R531 Weakness: Secondary | ICD-10-CM

## 2015-09-07 LAB — CALCIUM: Calcium: 9.8 mg/dL

## 2015-09-07 LAB — TSH: TSH: 2.39 u[IU]/mL (ref 0.41–5.90)

## 2015-09-07 LAB — HEPATIC FUNCTION PANEL
ALT: 19 U/L (ref 7–35)
AST: 17 U/L (ref 13–35)

## 2015-09-07 LAB — LIPID PANEL
Cholesterol: 123 mg/dL (ref 0–200)
HDL: 46 mg/dL (ref 35–70)
LDL Cholesterol: 59 mg/dL
Triglycerides: 88 mg/dL (ref 40–160)

## 2015-09-07 LAB — BASIC METABOLIC PANEL
Creatinine: 0.8 mg/dL (ref 0.5–1.1)
Glucose: 92 mg/dL
Potassium: 4.2 mmol/L (ref 3.4–5.3)
Sodium: 14 mmol/L — AB (ref 137–147)

## 2015-09-07 NOTE — Therapy (Signed)
[place], [place], [address] Phone: [phone]   Fax:  [phone]  Physical Therapy Treatment  Patient Details  Name: Jessica Ritter MRN: [id number] Date of Birth: 1960-11-24 Referring Provider: Dr. Marcial Pacas  Encounter Date: 09/07/2015      PT End of Session - 09/07/15 1708    Visit Number 3   Number of Visits 6   Date for PT Re-Evaluation 10/04/15   PT Start Time 1708   PT Stop Time [id number]   PT Time Calculation (min) 47 min   Activity Tolerance Patient tolerated treatment well      Past Medical History  Diagnosis Date  . Hyperlipidemia     Past Surgical History  Procedure Laterality Date  . Leg surgery    . Kidney surgery    . Brain tumor surgery      There were no vitals filed for this visit.  Visit Diagnosis:  Shoulder pain, left  Weakness of left side of body  Stiffness due to immobility  Abnormal posture  Decreased strength, endurance, and mobility      Subjective Assessment - 09/07/15 1708    Subjective Difficult to find time to exercise.             [facility] PT Assessment - 09/07/15 0001    Assessment   Medical Diagnosis Cerebral infarction   Onset Date/Surgical Date 05/01/15   Hand Dominance Right   Next MD Visit not scheduled   Prior Therapy hospital and home health   AROM   Overall AROM  --  AROM in standing    Left Shoulder Flexion 110 Degrees  minimal to no pain    PROM   Left Shoulder Flexion 138 Degrees  minimal pain supine                     [facility] Adult PT Treatment/Exercise - 09/07/15 0001    Therapeutic Activites    Therapeutic Activities --  instructed in application, use and care of TENS unit   Neuro Re-ed    Neuro Re-ed Details  working on posture and alignment; pulling shoulders down and back recruiting posterior shoulder girdle musculature   Shoulder Exercises: Seated   Flexion 10 reps  sliding arm FWD on table   Shoulder Exercises: Standing   Retraction Strengthening;5 reps  10 sec holds with noodle behind back   Other Standing Exercises chin tuck/chest lift postural work   Shoulder Exercises: Stretch   Table Stretch - Flexion 10 seconds   Manual Therapy   Manual therapy comments pt supine working on [employer] Mobilization gentle [place] motion    Soft tissue mobilization pecs/upper trap/leveator/deltoid    Myofascial Release pecs/ant chest   Scapular Mobilization Lt    Passive ROM Lt shd flex/abd/ER in scaption   Manual Traction manual traction/distraction through long arm with oscilations                 PT Education - 09/07/15 1802    Education provided Yes   Education Details TENS unit; HEP; instructed patient's husband in assisting with HEP including gentle PROM/stretching Lt shoulder    Person(s) Educated Patient;Spouse   Methods Explanation;Demonstration;Tactile cues;Verbal cues  pt recording ex on phone    Comprehension Verbalized understanding;Returned demonstration;Verbal cues required;Tactile cues required             PT Long Term Goals - 08/31/15 1806    PT LONG TERM GOAL #1  Title Decrease pain to 0/10 to /10 for 80% of day 10/04/15   Status On-going   PT LONG TERM GOAL #2   Title Increase AROM Lt shoudler to 90-100 deg elevation in flexion and scaption 10/04/15   Status On-going   PT LONG TERM GOAL #3   Title Patient will be able to lift arm to top of her head with minimal pain/difficulty 10/04/15   Status On-going   PT LONG TERM GOAL #4   Title Improve FOTO to </= 38% limitation 10/04/15   Status On-going               Plan - 09/07/15 1803    Clinical Impression Statement Patient and husband reviewed HEP and were instructed in application, use and care of TENS unit. Pt responded well to manual therapy and PROM/stretching with good gains in A/PROM. Progressing gradually toward stated goals of therapy.    Pt will benefit from skilled therapeutic  intervention in order to improve on the following deficits Pain;Postural dysfunction;Improper body mechanics;Decreased range of motion;Decreased mobility;Decreased strength;Decreased activity tolerance;Difficulty walking   Rehab Potential Good   PT Frequency 1x / week   PT Duration 6 weeks   PT Treatment/Interventions Patient/family education;ADLs/Self Care Home Management;Therapeutic exercise;Therapeutic activities;Passive range of motion;Manual techniques;Dry needling;Cryotherapy;Electrical Stimulation;Moist Heat;Ultrasound   PT Next Visit Plan myofacial ball release; manual work; Lexicographer; exercise for home; pulley in clinic; progress with ROM/strengthening as tolerated    PT Home Exercise Plan postural correction; HEP for patient and husband [name] and Agree with Plan of Care Patient        Problem List Patient Active Problem List   Diagnosis Date Noted  . Left shoulder pain 07/24/2015  . Osteopenia 05/09/2015  . Cerebral amyloid angiopathy 04/21/2015  . Subdural hematoma ([place]) 04/21/2015  . Epilepsy ([place]) 04/21/2015  . OSA (obstructive sleep apnea) 04/21/2015  . Hyperlipidemia 04/21/2015  . CVA (cerebral infarction) 2016 04/21/2015  . Urothelial cancer ([place]) 04/21/2015    Celyn Nilda Simmer PT, MPH 09/07/2015, 6:09 PM  [facility] [place], [place], [address] Phone: [phone]   Fax:  [phone]  Name: Juliamarie Ritter MRN: [id number] Date of Birth: 1961-02-16

## 2015-09-17 ENCOUNTER — Other Ambulatory Visit: Payer: Self-pay | Admitting: Family Medicine

## 2015-09-28 ENCOUNTER — Encounter: Payer: BLUE CROSS/BLUE SHIELD | Admitting: Rehabilitative and Restorative Service Providers"

## 2015-10-05 ENCOUNTER — Ambulatory Visit (INDEPENDENT_AMBULATORY_CARE_PROVIDER_SITE_OTHER): Payer: BLUE CROSS/BLUE SHIELD | Admitting: Rehabilitative and Restorative Service Providers"

## 2015-10-05 ENCOUNTER — Encounter: Payer: Self-pay | Admitting: Rehabilitative and Restorative Service Providers"

## 2015-10-05 DIAGNOSIS — M6289 Other specified disorders of muscle: Secondary | ICD-10-CM | POA: Diagnosis not present

## 2015-10-05 DIAGNOSIS — R531 Weakness: Secondary | ICD-10-CM

## 2015-10-05 DIAGNOSIS — M25512 Pain in left shoulder: Secondary | ICD-10-CM | POA: Diagnosis not present

## 2015-10-05 DIAGNOSIS — R293 Abnormal posture: Secondary | ICD-10-CM

## 2015-10-05 DIAGNOSIS — Z7409 Other reduced mobility: Secondary | ICD-10-CM

## 2015-10-05 DIAGNOSIS — M623 Immobility syndrome (paraplegic): Secondary | ICD-10-CM | POA: Diagnosis not present

## 2015-10-05 DIAGNOSIS — R6889 Other general symptoms and signs: Secondary | ICD-10-CM

## 2015-10-05 DIAGNOSIS — M2560 Stiffness of unspecified joint, not elsewhere classified: Secondary | ICD-10-CM

## 2015-10-05 NOTE — Therapy (Signed)
[place], [place], [address] Phone: [phone]   Fax:  [phone]  Physical Therapy Treatment  Patient Details  Name: Jessica Ritter MRN: [id number] Date of Birth: 02-05-1961 Referring Provider: Dr. Marcial Pacas  Encounter Date: 10/05/2015      PT End of Session - 10/05/15 1753    Visit Number 4   Number of Visits 6   Date for PT Re-Evaluation 10/04/15   PT Start Time 1709   PT Stop Time 1803   PT Time Calculation (min) 54 min   Activity Tolerance Patient tolerated treatment well;Patient limited by pain      Past Medical History  Diagnosis Date  . Hyperlipidemia     Past Surgical History  Procedure Laterality Date  . Leg surgery    . Kidney surgery    . Brain tumor surgery      There were no vitals filed for this visit.  Visit Diagnosis:  Shoulder pain, left  Weakness of left side of body  Stiffness due to immobility  Abnormal posture  Decreased strength, endurance, and mobility      Subjective Assessment - 10/05/15 1708    Subjective Out of town for the past few days. Has done wome exercises    Currently in Pain? Yes   Pain Score 7    Pain Location Shoulder   Pain Orientation Left   Pain Descriptors / Indicators Sore;Aching   Pain Type Acute pain   Pain Onset More than a month ago   Pain Frequency Intermittent            [facility] PT Assessment - 10/05/15 0001    Assessment   Medical Diagnosis Cerebral infarction   Onset Date/Surgical Date 05/01/15   Hand Dominance Right   Next MD Visit not scheduled   Prior Therapy hospital and home health   AROM   Left Shoulder Flexion 120 Degrees   Left Shoulder ABduction 106 Degrees   Left Shoulder Internal Rotation 38 Degrees   Left Shoulder External Rotation 65 Degrees   PROM   Left Shoulder Flexion 147 Degrees                     [facility] Adult PT Treatment/Exercise - 10/05/15 0001    Neuro Re-ed    Neuro Re-ed Details  working on posture and alignment; pulling shoulders down and back recruiting posterior shoulder girdle musculature   Shoulder Exercises: Seated   Flexion 10 reps  sliding arm FWD on table   Shoulder Exercises: Standing   Retraction Strengthening;5 reps  10 sec holds with noodle behind back   Other Standing Exercises chin tuck/chest lift postural work   Shoulder Exercises: Pulleys   Flexion --  10 sec x 10 reps    Moist Heat Therapy   Number Minutes Moist Heat 15 Minutes   Moist Heat Location Shoulder   Electrical Stimulation   Electrical Stimulation Location Lt shoulder   Electrical Stimulation Action IFC   Electrical Stimulation Parameters to tolerance   Electrical Stimulation Goals Pain   Manual Therapy   Manual therapy comments pt supine working on [employer]   Joint Mobilization gentle GH motion    Soft tissue mobilization pecs/upper trap/leveator/deltoid    Myofascial Release pecs/ant chest   Scapular Mobilization Lt    Passive ROM Lt shd flex/abd/ER in scaption   Manual Traction manual traction/distraction through long arm with oscilations  PT Education - 10/05/15 1730    Education provided Yes   Education Details HEP    Person(s) Educated Patient   Methods Explanation;Demonstration;Tactile cues;Verbal cues;Handout   Comprehension Verbalized understanding;Returned demonstration;Verbal cues required;Tactile cues required             PT Long Term Goals - 10/05/15 1755    PT LONG TERM GOAL #1   Title Decrease pain to 0/10 to 4/10 for 80% of day 10/04/15   Time 6   Period Weeks   Status On-going   PT LONG TERM GOAL #2   Title Increase AROM Lt shoudler to 90-100 deg elevation in flexion and scaption 10/04/15   Time 6   Period Weeks   Status Achieved   PT LONG TERM GOAL #3   Title Patient will be able to lift arm to top of her head with minimal pain/difficulty 10/04/15   Time 6   Period Weeks   Status On-going   PT  LONG TERM GOAL #4   Title Improve FOTO to </= 38% limitation 10/04/15   Time 6   Period Weeks   Status On-going               Plan - 10/05/15 1754    Clinical Impression Statement Patient and husband reports some continued improvement. She has less pain and imoroved ROM and active movement. Progressing well toward stated goals of therapy.    Pt will benefit from skilled therapeutic intervention in order to improve on the following deficits Pain;Postural dysfunction;Improper body mechanics;Decreased range of motion;Decreased mobility;Decreased strength;Decreased activity tolerance;Difficulty walking   PT Frequency 1x / week   PT Duration 6 weeks   PT Treatment/Interventions Patient/family education;ADLs/Self Care Home Management;Therapeutic exercise;Therapeutic activities;Passive range of motion;Manual techniques;Dry needling;Cryotherapy;Electrical Stimulation;Moist Heat;Ultrasound   PT Next Visit Plan myofacial ball release; manual work; Lexicographer; exercise for home; pulley in clinic; progress with ROM/strengthening as tolerated    PT Home Exercise Plan postural correction; HEP for patient and husband Waunita Schooner; suggested pulley for home    Consulted and Agree with Plan of Care Patient        Problem List Patient Active Problem List   Diagnosis Date Noted  . Left shoulder pain 07/24/2015  . Osteopenia 05/09/2015  . Cerebral amyloid angiopathy 04/21/2015  . Subdural hematoma ([place]) 04/21/2015  . Epilepsy ([place]) 04/21/2015  . OSA (obstructive sleep apnea) 04/21/2015  . Hyperlipidemia 04/21/2015  . CVA (cerebral infarction) 2016 04/21/2015  . Urothelial cancer ([place]) 04/21/2015    Celyn Nilda Simmer PT, MPH  10/05/2015, 5:57 PM  [facility] [place], [place], [address] Phone: [phone]   Fax:  [phone]  Name: Jessica Ritter MRN: [id number] Date of Birth: [birth year]

## 2015-10-05 NOTE — Patient Instructions (Signed)
Abduction (Eccentric) - Active-Assist (Pulley)    With hands lightly holding pulley, raise affected arm out to side with other arm. Avoid hiking shoulder. Then slowly lower affected arm for 3-5 seconds. Hold 10 sec 10__ reps per set 2-3  sets per day   Pulley - raise arm up by your ear hold 10 sec 10 reps

## 2015-10-18 ENCOUNTER — Other Ambulatory Visit: Payer: Self-pay | Admitting: Family Medicine

## 2015-10-19 ENCOUNTER — Encounter: Payer: BLUE CROSS/BLUE SHIELD | Admitting: Physical Therapy

## 2015-10-20 ENCOUNTER — Ambulatory Visit (INDEPENDENT_AMBULATORY_CARE_PROVIDER_SITE_OTHER): Payer: BLUE CROSS/BLUE SHIELD | Admitting: Physical Therapy

## 2015-10-20 DIAGNOSIS — M25512 Pain in left shoulder: Secondary | ICD-10-CM

## 2015-10-20 DIAGNOSIS — M623 Immobility syndrome (paraplegic): Secondary | ICD-10-CM

## 2015-10-20 DIAGNOSIS — R6889 Other general symptoms and signs: Secondary | ICD-10-CM

## 2015-10-20 DIAGNOSIS — R531 Weakness: Secondary | ICD-10-CM

## 2015-10-20 DIAGNOSIS — M6289 Other specified disorders of muscle: Secondary | ICD-10-CM | POA: Diagnosis not present

## 2015-10-20 DIAGNOSIS — R293 Abnormal posture: Secondary | ICD-10-CM

## 2015-10-20 DIAGNOSIS — M2560 Stiffness of unspecified joint, not elsewhere classified: Secondary | ICD-10-CM

## 2015-10-20 DIAGNOSIS — Z7409 Other reduced mobility: Secondary | ICD-10-CM

## 2015-10-20 NOTE — Patient Instructions (Signed)
SHOULDER: External Rotation - Supine (Cane)    Hold cane with both hands. Rotate arm away from body. Keep elbow on floor and next to body. _10__ reps per set, _1-2__ sets per day, _5__ days per week Add towel to keep elbow at side.  Copyright  VHI. All rights reserved.   Cane Exercise: Extension / Internal Rotation    Stand holding cane behind back with both hands palm-up. Slide cane up spine toward head. Hold ___5_ seconds. Repeat __10__ times. Do _1___ sessions per day.  http://[email or web address].exer.us/86   Copyright  VHI. All rights reserved.  Cane Exercise: Abduction / Adduction    Hold cane palms down. Keeping back flat, move cane side to side over chest. Hold __5__ seconds each side. Repeat __10__ times. Do _1-2___ sessions per day.  http://[email or web address].exer.us/90   Copyright  VHI. All rights reserved.  Cane Exercise: Flexion    Lie on back, holding cane above chest. Keeping arms as straight as possible, lower cane toward floor beyond head. Hold __5__ seconds. Repeat __10__ times. Do __1-2__ sessions per day.  http://[email or web address].exer.[email or web address]    [facility] [place], [place] [address]  [phone] (office) [phone] (fax)

## 2015-10-20 NOTE — Therapy (Addendum)
[place], [place], [address] Phone: [phone]   Fax:  [phone]  Physical Therapy Treatment  Patient Details  Name: Jessica Ritter MRN: [id number] Date of Birth: 15-Aug-1961 Referring Provider: Dr. Ileene Rubens  Encounter Date: 10/20/2015      PT End of Session - 10/20/15 1337    Visit Number 5   Number of Visits 6   PT Start Time 1332   PT Stop Time 1440   PT Time Calculation (min) 68 min   Activity Tolerance Patient tolerated treatment well      Past Medical History  Diagnosis Date  . Hyperlipidemia     Past Surgical History  Procedure Laterality Date  . Leg surgery    . Kidney surgery    . Brain tumor surgery      There were no vitals filed for this visit.  Visit Diagnosis:  No diagnosis found.      Subjective Assessment - 10/20/15 1337    Subjective Pt reports her Lt shoulder is flared up today due to sleeping on it wrong.  Slight improvement since last visit.    Currently in Pain? Yes   Pain Score 5    Pain Location Shoulder  globally, and in tricep   Pain Orientation Left   Pain Descriptors / Indicators Aching;Sore   Aggravating Factors  lifting arm, using Lt arm    Pain Relieving Factors ice             [facility] PT Assessment - 10/20/15 0001    Assessment   Medical Diagnosis Cerebral infarction   Referring Provider Dr. Ileene Rubens   Onset Date/Surgical Date 05/01/15   Hand Dominance Right   Next MD Visit not scheduled   Prior Therapy hospital and home health   Observation/Other Assessments   Focus on Therapeutic Outcomes (FOTO)  45% limited   AROM   Left Shoulder Flexion 124 Degrees   Left Shoulder ABduction 109 Degrees   Left Shoulder Internal Rotation 65 Degrees  supine, abd to 80 deg   Left Shoulder External Rotation 45 Degrees  supine, abd to 90 deg   PROM   Left Shoulder Flexion 142 Degrees           [facility] Adult PT Treatment/Exercise - 10/20/15 0001     Shoulder Exercises: Supine   External Rotation AAROM;Left;15 reps  cane, 5 sec hold   Flexion AAROM;Left;10 reps  cane, 5 sec hold   Other Supine Exercises cane AAROM horiz abd/add x 8 reps    Shoulder Exercises: Seated   Flexion AROM;Left;10 reps  hand reaching to head x 5, to high five x 5 reps   Other Seated Exercises scap retraction around noodle, 10 sec hold x 10 reps   Shoulder Exercises: Standing   Internal Rotation AAROM;10 reps  cane behind back, 5 sec hold. Then hand in back pocket with assistance of RUE.    Shoulder Exercises: Pulleys   Flexion 3 minutes - cues to stay on task for active stretch   ABduction 3 minutes   Modalities   Modalities Cryotherapy;Electrical Stimulation   Cryotherapy   Number Minutes Cryotherapy 15 Minutes   Cryotherapy Location Shoulder  Lt   Type of Cryotherapy Ice pack   Electrical Stimulation   Electrical Stimulation Location Lt shoulder   Electrical Stimulation Action IFC   Electrical Stimulation Parameters to tolerance x 15 min    Electrical Stimulation Goals Pain      Pt required increased time in  between each exercise and cues for improved form.          PT Education - 10/20/15 1443    Education provided Yes   Education Details HEP   Person(s) Educated Patient   Methods Explanation;Handout   Comprehension Returned demonstration;Verbalized understanding             PT Long Term Goals - 10/05/15 1755    PT LONG TERM GOAL #1   Title Decrease pain to 0/10 to 4/10 for 80% of day 10/04/15   Time 6   Period Weeks   Status On-going   PT LONG TERM GOAL #2   Title Increase AROM Lt shoudler to 90-100 deg elevation in flexion and scaption 10/04/15   Time 6   Period Weeks   Status Achieved   PT LONG TERM GOAL #3   Title Patient will be able to lift arm to top of her head with minimal pain/difficulty 10/04/15   Time 6   Period Weeks   Status Achieved    PT LONG TERM GOAL #4   Title Improve FOTO to </= 38% limitation  10/04/15   Time 6   Period Weeks   Status On-going - FOTO score of 45% limitation                Plan - 10/20/15 1431    Clinical Impression Statement Pt demonstrated improved Lt shoulder ROM this visit.  Pt has met LTG #3.  Pt and husband interested in continuation of therapy to meet established goals.  Pt making gradual progress towards remaining goals.    Pt will benefit from skilled therapeutic intervention in order to improve on the following deficits Pain;Postural dysfunction;Improper body mechanics;Decreased range of motion;Decreased mobility;Decreased strength;Decreased activity tolerance;Difficulty walking   Rehab Potential Good   PT Treatment/Interventions Patient/family education;ADLs/Self Care Home Management;Therapeutic exercise;Therapeutic activities;Passive range of motion;Manual techniques;Dry needling;Cryotherapy;Electrical Stimulation;Moist Heat;Ultrasound   PT Home Exercise Plan Continued progressive stretching/ ROM for Lt shoulder.     Consulted and Agree with Plan of Care Patient        Problem List Patient Active Problem List   Diagnosis Date Noted  . Left shoulder pain 07/24/2015  . Osteopenia 05/09/2015  . Cerebral amyloid angiopathy 04/21/2015  . Subdural hematoma ([place]) 04/21/2015  . Epilepsy ([place]) 04/21/2015  . OSA (obstructive sleep apnea) 04/21/2015  . Hyperlipidemia 04/21/2015  . CVA (cerebral infarction) 2016 04/21/2015  . Urothelial cancer ([place]) 04/21/2015    Kerin Perna, PTA 10/20/2015 4:53 PM  [facility] [place], [place], [address] Phone: [phone]   Fax:  [phone]  Name: Jessica Ritter MRN: [id number] Date of Birth: 1960/11/07   PHYSICAL THERAPY DISCHARGE SUMMARY  Visits from Start of Care: 5  Current functional level related to goals / functional outcomes: Improved shoulder pain, increased AROM/PROM; improved functional activity using UE    Remaining deficits: Continued limitations in ROM and strength related to neurological problems    Education / Equipment: HEP Plan: Patient agrees to discharge.  Patient goals were partially met. Patient is being discharged due to financial reasons.  ?????    Celyn P. Helene Kelp PT, MPH 11/03/2015 8:40 AM

## 2015-10-25 NOTE — Addendum Note (Signed)
Addended by: Everardo All on: 10/25/2015 01:14 PM   Modules accepted: Orders

## 2015-11-11 ENCOUNTER — Other Ambulatory Visit: Payer: Self-pay | Admitting: Family Medicine

## 2015-12-20 ENCOUNTER — Other Ambulatory Visit: Payer: Self-pay | Admitting: Family Medicine

## 2016-01-27 ENCOUNTER — Other Ambulatory Visit: Payer: Self-pay | Admitting: Family Medicine

## 2016-03-25 ENCOUNTER — Other Ambulatory Visit: Payer: Self-pay | Admitting: Family Medicine

## 2016-04-29 ENCOUNTER — Other Ambulatory Visit: Payer: Self-pay | Admitting: Family Medicine

## 2016-04-30 ENCOUNTER — Other Ambulatory Visit: Payer: Self-pay

## 2016-04-30 MED ORDER — ATORVASTATIN CALCIUM 80 MG PO TABS: 80.0000 mg | ORAL_TABLET | Freq: Every day | ORAL | Status: DC

## 2016-05-07 ENCOUNTER — Ambulatory Visit: Payer: BLUE CROSS/BLUE SHIELD | Admitting: Family Medicine

## 2016-05-14 ENCOUNTER — Encounter: Payer: Self-pay | Admitting: Family Medicine

## 2016-05-14 ENCOUNTER — Ambulatory Visit (INDEPENDENT_AMBULATORY_CARE_PROVIDER_SITE_OTHER): Payer: BLUE CROSS/BLUE SHIELD | Admitting: Family Medicine

## 2016-05-14 VITALS — BP 126/84 | HR 81 | Wt 170.0 lb

## 2016-05-14 DIAGNOSIS — K219 Gastro-esophageal reflux disease without esophagitis: Secondary | ICD-10-CM | POA: Insufficient documentation

## 2016-05-14 DIAGNOSIS — G40509 Epileptic seizures related to external causes, not intractable, without status epilepticus: Secondary | ICD-10-CM

## 2016-05-14 DIAGNOSIS — I6200 Nontraumatic subdural hemorrhage, unspecified: Secondary | ICD-10-CM | POA: Diagnosis not present

## 2016-05-14 DIAGNOSIS — E785 Hyperlipidemia, unspecified: Secondary | ICD-10-CM

## 2016-05-14 DIAGNOSIS — R29898 Other symptoms and signs involving the musculoskeletal system: Secondary | ICD-10-CM

## 2016-05-14 DIAGNOSIS — S065X9A Traumatic subdural hemorrhage with loss of consciousness of unspecified duration, initial encounter: Secondary | ICD-10-CM

## 2016-05-14 DIAGNOSIS — Z1239 Encounter for other screening for malignant neoplasm of breast: Secondary | ICD-10-CM

## 2016-05-14 DIAGNOSIS — S065XAA Traumatic subdural hemorrhage with loss of consciousness status unknown, initial encounter: Secondary | ICD-10-CM

## 2016-05-14 MED ORDER — ATORVASTATIN CALCIUM 80 MG PO TABS: 80.0000 mg | ORAL_TABLET | Freq: Every day | ORAL | 1 refills | Status: DC

## 2016-05-14 MED ORDER — LEVETIRACETAM 750 MG PO TABS: 750.0000 mg | ORAL_TABLET | Freq: Two times a day (BID) | ORAL | 1 refills | Status: DC

## 2016-05-14 MED ORDER — PANTOPRAZOLE SODIUM 40 MG PO TBEC: DELAYED_RELEASE_TABLET | ORAL | 1 refills | Status: DC

## 2016-05-14 NOTE — Progress Notes (Signed)
CC: Jessica Ritter is a 55 y.o. female is here for Medication Refill   Subjective: HPI:  Follow-up subdural hematoma: She denies any new motor or sensory disturbances. She denies any new headaches. She continues to have left leg weakness and reached a plateau with physical therapy and insurance stopped paying for formal physical therapy. She wants no further something she can do at home to try to help strengthen up her left leg. It's mostly a foot drop denies weakness elsewhere.  Follow epilepsy: Since being on Keppra she denies any new involuntary movements or seizure activity.  Follow hyperlipidemia: Her last cholesterol check was back in 2011. She is taking Lipitor on a daily basis without right upper quadrant pain or myalgia. She needs a refill on this medication today.  Follow-up GERD: She is requesting a refill on Protonix. Ever since starting this medication she's not had any epigastric discomfort or reflux symptoms. She has no gastrointestinal concerns today   Review Of Systems Outlined In HPI  Past Medical History:  Diagnosis Date  . Hyperlipidemia     Past Surgical History:  Procedure Laterality Date  . brain tumor surgery    . KIDNEY SURGERY    . LEG SURGERY     Family History  Problem Relation Age of Onset  . Cancer      mother     Social History   Social History  . Marital status: Married    Spouse name: N/A  . Number of children: N/A  . Years of education: N/A   Occupational History  . Not on file.   Social History Main Topics  . Smoking status: Never Smoker  . Smokeless tobacco: Not on file  . Alcohol use 1.2 oz/week    2 Standard drinks or equivalent per week  . Drug use: No  . Sexual activity: No   Other Topics Concern  . Not on file   Social History Narrative  . No narrative on file     Objective: BP 126/84   Pulse 81   Wt 170 lb (77.1 kg)   BMI 27.44 kg/m   General: Alert and Oriented, No Acute Distress HEENT: Pupils equal, round,  reactive to light. Conjunctivae clear.  Moist mucous membranes Lungs: Clear to auscultation bilaterally, no wheezing/ronchi/rales.  Comfortable work of breathing. Good air movement. Cardiac: Regular rate and rhythm. Normal S1/S2.  No murmurs, rubs, nor gallops.   Abdomen: Normal bowel sounds, soft and non tender without palpable masses. Extremities: No peripheral edema.  Strong peripheral pulses. Mild left-sided foot drop. No signs of muscle atrophy from the quadriceps distally.  Mental Status: No depression, anxiety, nor agitation. Skin: Warm and dry.  Assessment & Plan: Kaylon was seen today for medication refill.  Diagnoses and all orders for this visit:  Subdural hematoma ([place]) -     COMPLETE METABOLIC PANEL WITH GFR  Nonintractable epilepsy due to external causes, without status epilepticus (HCC) -     COMPLETE METABOLIC PANEL WITH GFR  Hyperlipidemia -     Lipid panel -     COMPLETE METABOLIC PANEL WITH GFR  Gastroesophageal reflux disease, esophagitis presence not specified -     COMPLETE METABOLIC PANEL WITH GFR  Left leg weakness  Screening for malignant neoplasm of breast -     MM DIGITAL SCREENING BILATERAL; Future  Other orders -     atorvastatin (LIPITOR) 80 MG tablet; Take 1 tablet (80 mg total) by mouth daily. -     levETIRAcetam (KEPPRA) 750  MG tablet; Take 1 tablet (750 mg total) by mouth 2 (two) times daily. -     pantoprazole (PROTONIX) 40 MG tablet; TAKE ONE TABLET BY MOUTH ONCE DAILY   Subdural hematoma: She will continue to avoid anticoagulants. I gave her home rehabilitation exercise plan to engage in for the next 3 weeks to try and see if she can help reduce her foot drop. Epilepsy: Controlled with Keppra Hyperlipidemia: Due for lipid panel, continue atorvastatin pending results, checking liver function GERD: Controlled with Protonix She's due for mammogram or has been placed.  Discussed with this patient that I will be resigning from my position here  with [facility] in September in order to stay with my family who will be moving to [facility]. I let him know about the providers that are still accepting patients and I feel that this individual will be under great care if he/she stays here with [facility].  She's been most of her time in [place] and will consider following up with me down there in 6 months however she noticed that she is walking to follow-up here in [place] in 6 months with one 3 providers I showed her who are still accepting patients.   No Follow-up on file.

## 2016-05-15 LAB — COMPLETE METABOLIC PANEL WITH GFR
ALT: 16 U/L (ref 6–29)
AST: 17 U/L (ref 10–35)
Albumin: 4.3 g/dL (ref 3.6–5.1)
Alkaline Phosphatase: 68 U/L (ref 33–130)
BUN: 18 mg/dL (ref 7–25)
CO2: 26 mmol/L (ref 20–31)
Calcium: 9.5 mg/dL (ref 8.6–10.4)
Chloride: 106 mmol/L (ref 98–110)
Creat: 0.94 mg/dL (ref 0.50–1.05)
GFR, Est African American: 80 mL/min (ref >=60)
GFR, Est Non African American: 69 mL/min (ref >=60)
Glucose, Bld: 91 mg/dL (ref 65–99)
Potassium: 4.4 mmol/L (ref 3.5–5.3)
Sodium: 143 mmol/L (ref 135–146)
Total Bilirubin: 1.3 mg/dL — ABNORMAL HIGH (ref 0.2–1.2)
Total Protein: 6.7 g/dL (ref 6.1–8.1)

## 2016-05-15 LAB — LIPID PANEL
Cholesterol: 155 mg/dL (ref 125–200)
HDL: 60 mg/dL (ref >=46)
LDL Cholesterol: 76 mg/dL (ref <130)
Total CHOL/HDL Ratio: 2.6 Ratio (ref <=5.0)
Triglycerides: 95 mg/dL (ref <150)
VLDL: 19 mg/dL (ref <30)

## 2016-05-27 DIAGNOSIS — C7931 Secondary malignant neoplasm of brain: Secondary | ICD-10-CM | POA: Diagnosis not present

## 2016-05-27 DIAGNOSIS — C7949 Secondary malignant neoplasm of other parts of nervous system: Secondary | ICD-10-CM | POA: Diagnosis not present

## 2016-05-27 DIAGNOSIS — C659 Malignant neoplasm of unspecified renal pelvis: Secondary | ICD-10-CM | POA: Diagnosis not present

## 2016-11-25 ENCOUNTER — Ambulatory Visit (INDEPENDENT_AMBULATORY_CARE_PROVIDER_SITE_OTHER): Payer: BLUE CROSS/BLUE SHIELD | Admitting: Physician Assistant

## 2016-11-25 VITALS — BP 124/86 | HR 111 | Wt 170.0 lb

## 2016-11-25 DIAGNOSIS — B009 Herpesviral infection, unspecified: Secondary | ICD-10-CM

## 2016-11-25 DIAGNOSIS — Z0000 Encounter for general adult medical examination without abnormal findings: Secondary | ICD-10-CM

## 2016-11-25 DIAGNOSIS — Z8551 Personal history of malignant neoplasm of bladder: Secondary | ICD-10-CM | POA: Insufficient documentation

## 2016-11-25 DIAGNOSIS — E854 Organ-limited amyloidosis: Secondary | ICD-10-CM

## 2016-11-25 DIAGNOSIS — I680 Cerebral amyloid angiopathy: Secondary | ICD-10-CM

## 2016-11-25 DIAGNOSIS — B0089 Other herpesviral infection: Secondary | ICD-10-CM

## 2016-11-25 DIAGNOSIS — G40509 Epileptic seizures related to external causes, not intractable, without status epilepticus: Secondary | ICD-10-CM

## 2016-11-25 DIAGNOSIS — Z8679 Personal history of other diseases of the circulatory system: Secondary | ICD-10-CM

## 2016-11-25 MED ORDER — LEVETIRACETAM 750 MG PO TABS: 750.0000 mg | ORAL_TABLET | Freq: Two times a day (BID) | ORAL | 1 refills | Status: DC

## 2016-11-25 MED ORDER — VALACYCLOVIR HCL 500 MG PO TABS: 500.0000 mg | ORAL_TABLET | Freq: Two times a day (BID) | ORAL | 5 refills | Status: DC

## 2016-11-25 NOTE — Patient Instructions (Addendum)
Cologuard is ordered. They will be contacting you shortly.  Mammogram is ordered. You can make an appt. downstairs to imaging today or they will call you to schedule it here at the [facility]  Your hemoglobin today was 12.8 (no anemia)  Online Exercise Programs - FlintFit DVD [email or web address] [email or web address]) - Mat Pilates - Yoga with Adriene (YouTube - free) - Daily Burn (online subscription - more intense workouts)  Make an appointment for your cervical cancer screening (pap smear) within the next 3-6 months   Preventive Care 40-64 Years, Female Preventive care refers to lifestyle choices and visits with your health care provider that can promote health and wellness. What does preventive care include?  A yearly physical exam. This is also called an annual well check.  Dental exams once or twice a year.  Routine eye exams. Ask your health care provider how often you should have your eyes checked.  Personal lifestyle choices, including:  Daily care of your teeth and gums.  Regular physical activity.  Eating a healthy diet.  Avoiding tobacco and drug use.  Limiting alcohol use.  Practicing safe sex.  Taking low-dose aspirin daily starting at age 39.  Taking vitamin and mineral supplements as recommended by your health care provider. What happens during an annual well check? The services and screenings done by your health care provider during your annual well check will depend on your age, overall health, lifestyle risk factors, and family history of disease. Counseling  Your health care provider may ask you questions about your:  Alcohol use.  Tobacco use.  Drug use.  Emotional well-being.  Home and relationship well-being.  Sexual activity.  Eating habits.  Work and work Statistician.  Method of birth control.  Menstrual cycle.  Pregnancy history. Screening  You may have the following tests or measurements:  Height, weight, and BMI.  Blood pressure.  Lipid and  cholesterol levels. These may be checked every 5 years, or more frequently if you are over 76 years old.  Skin check.  Lung cancer screening. You may have this screening every year starting at age 45 if you have a 30-pack-year history of smoking and currently smoke or have quit within the past 15 years.  Fecal occult blood test (FOBT) of the stool. You may have this test every year starting at age 39.  Flexible sigmoidoscopy or colonoscopy. You may have a sigmoidoscopy every 5 years or a colonoscopy every 10 years starting at age 42.  Hepatitis C blood test.  Hepatitis B blood test.  Sexually transmitted disease (STD) testing.  Diabetes screening. This is done by checking your blood sugar (glucose) after you have not eaten for a while (fasting). You may have this done every 1-3 years.  Mammogram. This may be done every 1-2 years. Talk to your health care provider about when you should start having regular mammograms. This may depend on whether you have a family history of breast cancer.  BRCA-related cancer screening. This may be done if you have a family history of breast, ovarian, tubal, or peritoneal cancers.  Pelvic exam and Pap test. This may be done every 3 years starting at age 1. Starting at age 14, this may be done every 5 years if you have a Pap test in combination with an HPV test.  Bone density scan. This is done to screen for osteoporosis. You may have this scan if you are at high risk for osteoporosis. Discuss your test results, treatment options, and if necessary, the need for more  tests with your health care provider. Vaccines  Your health care provider may recommend certain vaccines, such as:  Influenza vaccine. This is recommended every year.  Tetanus, diphtheria, and acellular pertussis (Tdap, Td) vaccine. You may need a Td booster every 10 years.  Varicella vaccine. You may need this if you have not been vaccinated.  Zoster vaccine. You may need this after age  66.  Measles, mumps, and rubella (MMR) vaccine. You may need at least one dose of MMR if you were born in 1957 or later. You may also need a second dose.  Pneumococcal 13-valent conjugate (PCV13) vaccine. You may need this if you have certain conditions and were not previously vaccinated.  Pneumococcal polysaccharide (PPSV23) vaccine. You may need one or two doses if you smoke cigarettes or if you have certain conditions.  Meningococcal vaccine. You may need this if you have certain conditions.  Hepatitis A vaccine. You may need this if you have certain conditions or if you travel or work in places where you may be exposed to hepatitis A.  Hepatitis B vaccine. You may need this if you have certain conditions or if you travel or work in places where you may be exposed to hepatitis B.  Haemophilus influenzae type b (Hib) vaccine. You may need this if you have certain conditions. Talk to your health care provider about which screenings and vaccines you need and how often you need them. This information is not intended to replace advice given to you by your health care provider. Make sure you discuss any questions you have with your health care provider. Document Released: 11/03/2015 Document Revised: 06/26/2016 Document Reviewed: 08/08/2015 Elsevier Interactive Patient Education  2017 [employer].

## 2016-11-25 NOTE — Progress Notes (Signed)
HPI:                                                                Jessica Ritter is a 56 y.o. female who presents to [place]: [place] today to establish care  Patient is accompanied by her husband who assists in providing history  Patient has a history of metastatic renal pelvis cancer with multiple recurrences (1998,2002,2003), subdural hematoma from fall (06/2014), and CVA 2/2 amyloid angiopathy (03/2015).  Hx of Cancer: she is followed by [facility] in [place] yearly. She has not had active disease for many years. Most recent outside labs:  Component Value Date  WBC 6.18 05/27/2016  HGB 13.3 05/27/2016  HCT 38.8 05/27/2016  MCV 92.0 05/27/2016  PLT 199.00 05/27/2016   NA 143 05/27/2016  K 3.9 05/27/2016  CL 108 05/27/2016  CO2 29 05/27/2016  BUN 21 05/27/2016  CREATININE 0.9 05/27/2016  GFR >60 06/09/2014  GLUC 97 05/27/2016  CALCIUM 9.9 05/27/2016  TP 7.4 05/27/2016  TBIL 1.1 05/27/2016  AST 21.0 05/27/2016  ALT 23 05/27/2016  ALK 79 05/27/2016   Hx of CVA/SDH: she was followed by Dr. Lynnette Caffey, [facility] and has been stable since 09/2015. Requesting refill of Keppra. She has some left-sided hemiparesis. She denies paresthesias, new focal weakness or recent falls. She denies new headache. Insurance will no longer cover PT and patient is interested in at-home exercise routines.  Health Maintenance Health Maintenance  Topic Date Due  . TETANUS/TDAP  09/30/1981  . Fecal DNA (Cologuard)  09/30/2012  . MAMMOGRAM  01/26/2016  . PAP SMEAR  10/06/2016  . Hepatitis C Screening  04/25/2017 (Originally 07/19/1961)  . HIV Screening  04/25/2017 (Originally 09/30/1977)  . INFLUENZA VACCINE  07/26/2017 (Originally 05/21/2016)    Past Medical History:  Diagnosis Date  . Cancer [facility])    metastatic bladder  . CVA (cerebral vascular accident) ([place]) 03/2015   left hemiparesis  . History of malignant neoplasm  metastatic to lung 2002  . History of malignant neoplasm of brain 2003   secondary, renal  . History of malignant neoplasm of renal pelvis 1998  . History of multiple pulmonary nodules 2002  . Hyperlipidemia   . Subdural hematoma caused by concussion ([place]) 04/2014   Past Surgical History:  Procedure Laterality Date  . brain tumor surgery    . KIDNEY SURGERY    . LEG SURGERY    . LUNG LOBECTOMY Right 2002   RLL   Social History  Substance Use Topics  . Smoking status: Former Smoker    Types: Cigarettes    Quit date: 07/21/1997  . Smokeless tobacco: Never Used  . Alcohol use 2.4 oz/week    2 Standard drinks or equivalent, 2 Glasses of wine per week   family history is not on file.  ROS: negative except as noted in the HPI  Medications: Current Outpatient Prescriptions  Medication Sig Dispense Refill  . atorvastatin (LIPITOR) 80 MG tablet Take 1 tablet (80 mg total) by mouth daily. 90 tablet 1  . levETIRAcetam (KEPPRA) 750 MG tablet Take 1 tablet (750 mg total) by mouth 2 (two) times daily. 180 tablet 1  . pantoprazole (PROTONIX) 40 MG tablet TAKE ONE TABLET BY MOUTH ONCE DAILY 90  tablet 1  . valACYclovir (VALTREX) 500 MG tablet Take 1 tablet (500 mg total) by mouth 2 (two) times daily. 6 tablet 5  . aspirin EC 81 MG tablet Take 81 mg by mouth daily.     No current facility-administered medications for this visit.    Allergies  Allergen Reactions  . Bee Venom Other (See Comments)  . Anticoagulant Compound     Subdural Hematoma  . Ciprofloxacin     Vomiting        Objective:  BP 124/86   Pulse (!) 111   Wt 170 lb (77.1 kg)   BMI 27.44 kg/m  Gen: well-groomed, cooperative, not ill-appearing, no distress HEENT: normal conjunctiva, TM's clear, oropharynx clear, moist mucus membranes, no thyromegaly or tenderness Pulm: Normal work of breathing, clear to auscultation bilaterally CV: Rate 92, regular rhythm, s1 and s2 distinct, no murmurs, clicks or rubs appreciated  on this exam, no carotid bruit, distal pulses intact, no peripheral edema GI: soft, nondistended, nontender, no masses Neuro: alert and oriented x 3, EOM's intact, PERRLA, right patellar DTR 2+, left patellar DTR 1+, gait is slow and shuffling MSK: normal tone, strength 5/5 in RUE, and bilateral lower extremities, strength 4+/5 in LUE Skin: warm and dry, no rashes or lesions on exposed skin Psych: normal affect, pleasant mood, speech is slow and slurred at times, vocabulary appropriate, normal thought content   Results for orders placed or performed in visit on 11/25/16 (from the past 72 hour(s))  POCT hemoglobin     Status: Normal   Collection Time: 11/26/16  8:10 AM  Result Value Ref Range   Hemoglobin 12.8 12.2 - 16.2 g/dL   No results found.    Assessment and Plan: 56 y.o. female with   1. Recurrent herpes simplex virus (HSV) infection of buttock - not on daily suppressive. patient requests refill for as needed flares  - valACYclovir (VALTREX) 500 MG tablet; Take 1 tablet (500 mg total) by mouth 2 (two) times daily.  Dispense: 6 tablet; Refill: 5  2. Encounter for preventative adult health care examination - MM Digital Screening ordered - Cologuard ordered - husband requests to defer labs until August. Agreed to POCT Hgb today, which was wnl. After the visit I was able to review outside labs from Hem/Onc which showed normal renal function, electrolytes and blood counts - Pap smear due. Husband requests to defer until Aug.  3. 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 cont daily Keppra - levETIRAcetam (KEPPRA) 750 MG tablet; Take 1 tablet (750 mg total) by mouth 2 (two) times daily.  Dispense: 180 tablet; Refill: 1  4. Hx of CVA, amyloid angiopathy - cont daily baby ASA and statin  Patient education and anticipatory guidance given Patient agrees with treatment plan Follow-up in 6 months or sooner as needed  Darlyne Russian PA-C

## 2016-11-26 ENCOUNTER — Encounter: Payer: Self-pay | Admitting: Physician Assistant

## 2016-11-26 ENCOUNTER — Other Ambulatory Visit: Payer: Self-pay | Admitting: Physician Assistant

## 2016-11-26 LAB — POCT HEMOGLOBIN: Hemoglobin: 12.8 g/dL (ref 12.2–16.2)

## 2016-11-27 DIAGNOSIS — B0089 Other herpesviral infection: Secondary | ICD-10-CM | POA: Insufficient documentation

## 2016-11-27 DIAGNOSIS — B009 Herpesviral infection, unspecified: Secondary | ICD-10-CM | POA: Insufficient documentation

## 2016-12-09 ENCOUNTER — Other Ambulatory Visit: Payer: Self-pay | Admitting: Physician Assistant

## 2016-12-09 ENCOUNTER — Telehealth: Payer: Self-pay

## 2016-12-09 DIAGNOSIS — Z1211 Encounter for screening for malignant neoplasm of colon: Secondary | ICD-10-CM

## 2016-12-09 NOTE — Telephone Encounter (Signed)
Husband notified of referral to GI

## 2016-12-24 ENCOUNTER — Other Ambulatory Visit: Payer: Self-pay

## 2016-12-24 MED ORDER — ATORVASTATIN CALCIUM 80 MG PO TABS: 80.0000 mg | ORAL_TABLET | Freq: Every day | ORAL | 0 refills | Status: DC

## 2016-12-25 ENCOUNTER — Telehealth: Payer: Self-pay | Admitting: Physician Assistant

## 2016-12-25 DIAGNOSIS — E782 Mixed hyperlipidemia: Secondary | ICD-10-CM

## 2016-12-25 NOTE — Telephone Encounter (Signed)
Pt needs a refill on her cholesteral med; however, the pharmacy will not refill the medication until pt has labs. Patient's husband does not want to pay for another office visit or for more labs. I explained to him why it was necessary and that Evlyn Clines may be able to order more labs without an office visit. Please advise. Thanks!

## 2016-12-26 NOTE — Telephone Encounter (Signed)
Husband notified.  He is ok with Jessica Ritter only having lab work. EH/RMA

## 2016-12-26 NOTE — Telephone Encounter (Signed)
Please advise 

## 2016-12-26 NOTE — Telephone Encounter (Signed)
See note below

## 2016-12-26 NOTE — Telephone Encounter (Signed)
They do not need an office visit. However, please explain that for me to safely prescribe medications I need an annual complete metabolic panel to know that renal function and liver enzymes are normal. The reason for the lipid panel is that if patient has elevated triglycerides or LDL while on this medication then it would be indicated to add additional therapy to reduce cardiovascular/stroke risk.

## 2017-01-08 ENCOUNTER — Other Ambulatory Visit: Payer: Self-pay | Admitting: Physician Assistant

## 2017-01-14 DIAGNOSIS — L723 Sebaceous cyst: Secondary | ICD-10-CM | POA: Diagnosis not present

## 2017-01-14 DIAGNOSIS — Z1389 Encounter for screening for other disorder: Secondary | ICD-10-CM | POA: Diagnosis not present

## 2017-02-04 ENCOUNTER — Other Ambulatory Visit: Payer: Self-pay | Admitting: Physician Assistant

## 2017-02-04 DIAGNOSIS — E782 Mixed hyperlipidemia: Secondary | ICD-10-CM

## 2017-02-04 LAB — COMPLETE METABOLIC PANEL WITH GFR
ALT: 23 U/L (ref 6–29)
AST: 21 U/L (ref 10–35)
Albumin: 4.2 g/dL (ref 3.6–5.1)
Alkaline Phosphatase: 71 U/L (ref 33–130)
BUN: 20 mg/dL (ref 7–25)
CO2: 30 mmol/L (ref 20–31)
Calcium: 9.7 mg/dL (ref 8.6–10.4)
Chloride: 104 mmol/L (ref 98–110)
Creat: 0.96 mg/dL (ref 0.50–1.05)
GFR, Est African American: 78 mL/min (ref >=60)
GFR, Est Non African American: 67 mL/min (ref >=60)
Glucose, Bld: 91 mg/dL (ref 65–99)
Potassium: 4.2 mmol/L (ref 3.5–5.3)
Sodium: 143 mmol/L (ref 135–146)
Total Bilirubin: 1 mg/dL (ref 0.2–1.2)
Total Protein: 6.8 g/dL (ref 6.1–8.1)

## 2017-02-04 LAB — LIPID PANEL
Cholesterol: 149 mg/dL (ref <200)
HDL: 49 mg/dL — ABNORMAL LOW (ref >50)
LDL Cholesterol: 63 mg/dL (ref <100)
Total CHOL/HDL Ratio: 3 Ratio (ref <5.0)
Triglycerides: 187 mg/dL — ABNORMAL HIGH (ref <150)
VLDL: 37 mg/dL — ABNORMAL HIGH (ref <30)

## 2017-02-04 LAB — LDL CHOLESTEROL, DIRECT: Direct LDL: 71 mg/dL (ref <130)

## 2017-02-05 ENCOUNTER — Other Ambulatory Visit: Payer: Self-pay

## 2017-02-05 MED ORDER — PANTOPRAZOLE SODIUM 40 MG PO TBEC: DELAYED_RELEASE_TABLET | ORAL | 1 refills | Status: DC

## 2017-02-19 ENCOUNTER — Other Ambulatory Visit: Payer: Self-pay | Admitting: Physician Assistant

## 2017-05-25 ENCOUNTER — Other Ambulatory Visit: Payer: Self-pay | Admitting: Physician Assistant

## 2017-05-25 DIAGNOSIS — G40509 Epileptic seizures related to external causes, not intractable, without status epilepticus: Secondary | ICD-10-CM

## 2017-06-09 DIAGNOSIS — Z7982 Long term (current) use of aspirin: Secondary | ICD-10-CM | POA: Diagnosis not present

## 2017-06-09 DIAGNOSIS — R569 Unspecified convulsions: Secondary | ICD-10-CM | POA: Diagnosis not present

## 2017-06-09 DIAGNOSIS — Z8551 Personal history of malignant neoplasm of bladder: Secondary | ICD-10-CM | POA: Diagnosis not present

## 2017-06-09 DIAGNOSIS — Z08 Encounter for follow-up examination after completed treatment for malignant neoplasm: Secondary | ICD-10-CM | POA: Diagnosis not present

## 2017-06-09 DIAGNOSIS — Z87891 Personal history of nicotine dependence: Secondary | ICD-10-CM | POA: Diagnosis not present

## 2017-06-09 DIAGNOSIS — R3129 Other microscopic hematuria: Secondary | ICD-10-CM | POA: Diagnosis not present

## 2017-06-09 DIAGNOSIS — C659 Malignant neoplasm of unspecified renal pelvis: Secondary | ICD-10-CM | POA: Diagnosis not present

## 2017-06-09 DIAGNOSIS — C7931 Secondary malignant neoplasm of brain: Secondary | ICD-10-CM | POA: Diagnosis not present

## 2017-06-09 DIAGNOSIS — Z85841 Personal history of malignant neoplasm of brain: Secondary | ICD-10-CM | POA: Diagnosis not present

## 2017-06-09 DIAGNOSIS — Z85118 Personal history of other malignant neoplasm of bronchus and lung: Secondary | ICD-10-CM | POA: Diagnosis not present

## 2017-06-09 DIAGNOSIS — C7949 Secondary malignant neoplasm of other parts of nervous system: Secondary | ICD-10-CM | POA: Diagnosis not present

## 2017-06-09 DIAGNOSIS — Z8589 Personal history of malignant neoplasm of other organs and systems: Secondary | ICD-10-CM | POA: Diagnosis not present

## 2017-07-02 ENCOUNTER — Encounter: Payer: Self-pay | Admitting: Physician Assistant

## 2017-07-02 ENCOUNTER — Ambulatory Visit (INDEPENDENT_AMBULATORY_CARE_PROVIDER_SITE_OTHER): Payer: BLUE CROSS/BLUE SHIELD | Admitting: Physician Assistant

## 2017-07-02 VITALS — BP 119/84 | HR 80 | Wt 169.0 lb

## 2017-07-02 DIAGNOSIS — B009 Herpesviral infection, unspecified: Secondary | ICD-10-CM

## 2017-07-02 DIAGNOSIS — I69322 Dysarthria following cerebral infarction: Secondary | ICD-10-CM

## 2017-07-02 DIAGNOSIS — Z0000 Encounter for general adult medical examination without abnormal findings: Secondary | ICD-10-CM | POA: Diagnosis not present

## 2017-07-02 DIAGNOSIS — H6123 Impacted cerumen, bilateral: Secondary | ICD-10-CM | POA: Insufficient documentation

## 2017-07-02 DIAGNOSIS — I5189 Other ill-defined heart diseases: Secondary | ICD-10-CM

## 2017-07-02 DIAGNOSIS — H9313 Tinnitus, bilateral: Secondary | ICD-10-CM | POA: Diagnosis not present

## 2017-07-02 DIAGNOSIS — H6121 Impacted cerumen, right ear: Secondary | ICD-10-CM | POA: Diagnosis not present

## 2017-07-02 DIAGNOSIS — I519 Heart disease, unspecified: Secondary | ICD-10-CM | POA: Insufficient documentation

## 2017-07-02 DIAGNOSIS — E663 Overweight: Secondary | ICD-10-CM | POA: Diagnosis not present

## 2017-07-02 DIAGNOSIS — Z23 Encounter for immunization: Secondary | ICD-10-CM | POA: Diagnosis not present

## 2017-07-02 DIAGNOSIS — B0089 Other herpesviral infection: Secondary | ICD-10-CM

## 2017-07-02 HISTORY — DX: Other ill-defined heart diseases: I51.89

## 2017-07-02 HISTORY — DX: Dysarthria following cerebral infarction: I69.322

## 2017-07-02 MED ORDER — VALACYCLOVIR HCL 500 MG PO TABS: ORAL_TABLET | ORAL | 5 refills | Status: DC

## 2017-07-02 NOTE — Progress Notes (Signed)
HPI:                                                                Jessica Ritter is a 56 y.o. female who presents to [place]: [place] today for annual physical exam  No current concerns today. She is requesting a refill of her Valtrex.   Past Medical History:  Diagnosis Date  . Cancer [facility])    metastatic bladder  . CVA (cerebral vascular accident) ([place]) 03/2015   left hemiparesis  . History of malignant neoplasm metastatic to lung 2002  . History of malignant neoplasm of brain 2003   secondary, renal  . History of malignant neoplasm of renal pelvis 1998  . History of multiple pulmonary nodules 2002  . Hyperlipidemia   . Subdural hematoma caused by concussion ([place]) 04/2014   Past Surgical History:  Procedure Laterality Date  . brain tumor surgery    . KIDNEY SURGERY    . LEG SURGERY    . LUNG LOBECTOMY Right 2002   RLL   Social History  Substance Use Topics  . Smoking status: Former Smoker    Types: Cigarettes    Quit date: 07/21/1997  . Smokeless tobacco: Never Used  . Alcohol use 2.4 oz/week    2 Standard drinks or equivalent, 2 Glasses of wine per week   family history includes Cancer in her unknown relative.  ROS: Review of Systems  Constitutional: Negative.   HENT: Positive for tinnitus.   Eyes: Negative.   Respiratory: Negative.   Cardiovascular: Negative.   Gastrointestinal: Negative.   Genitourinary: Negative.   Musculoskeletal: Negative.   Skin: Positive for rash.  Neurological: Negative.  Negative for seizures and headaches.  Endo/Heme/Allergies: Negative.   Psychiatric/Behavioral: Negative.      Medications: Current Outpatient Prescriptions  Medication Sig Dispense Refill  . aspirin EC 81 MG tablet Take 81 mg by mouth daily.    Marland Kitchen atorvastatin (LIPITOR) 80 MG tablet Take 1 tablet (80 mg total) by mouth daily at 6 PM. 90 tablet 1  . levETIRAcetam (KEPPRA) 750 MG tablet TAKE ONE TABLET BY MOUTH TWICE  DAILY 180 tablet 1  . omeprazole (PRILOSEC) 20 MG capsule Take 20 mg by mouth daily.    . pantoprazole (PROTONIX) 40 MG tablet TAKE ONE TABLET BY MOUTH ONCE DAILY 90 tablet 1  . valACYclovir (VALTREX) 500 MG tablet Take 1 tablet (500 mg total) by mouth 2 (two) times daily. 6 tablet 5   No current facility-administered medications for this visit.    Allergies  Allergen Reactions  . Bee Venom Other (See Comments)  . Anticoagulant Compound     Subdural Hematoma  . Ciprofloxacin     Vomiting        Objective:  BP 119/84   Pulse 80   Wt 169 lb (76.7 kg)   BMI 27.28 kg/m  General Appearance:  Alert, cooperative, no distress, appropriate for age, overweight female                            Head:  Normocephalic, without obvious abnormality  Eyes:  PERRL, EOM's intact, conjunctiva and cornea clear                             Ears:  Right TM partially obstructed by cerumen, TM pearly gray color and semitransparent, external ear canals normal, both ears                            Nose:  Nares symmetrical                          Throat:  Lips, tongue, and mucosa are moist, pink, and intact; good dentition                             Neck:  Supple; symmetrical, trachea midline, no adenopathy; thyroid: no enlargement, symmetric, no tenderness/mass/nodules                             Back:  Symmetrical, no curvature, ROM normal               Chest/Breast:  deferred                           Lungs:  Clear to auscultation bilaterally, respirations unlabored                             Heart:  regular rate & normal rhythm, S1 and S2 normal, no murmurs, rubs, or gallops                     Abdomen:  Soft, non-tender, no mass or organomegaly              Genitourinary:  deferred         Musculoskeletal:  Tone and strength strong and symmetrical, all extremities; no joint pain or edema, slow shuffling gait, stooped posture                                Lymphatic:  No  adenopathy             Skin/Hair/Nails:  Skin warm, dry and intact, no rashes; Head-to-toe skin examination was performed and areas of sun damage are noted with scattered lentigines, brown flat macules, pigmented lesions with regular borders are noted                   Neurologic:  Alert and oriented x3, no cranial nerve deficits, DTR's intact, sensation grossly intact, normal gait and station, no tremor Psych: well-groomed, cooperative, good eye contact, euthymic mood, affect mood-congruent, speech is dysarthric, and thought processes clear and goal-directed  No results found for this or any previous visit (from the past 72 hour(s)). No results found.  Depression screen PHQ 2/9 07/02/2017  Decreased Interest 0  Down, Depressed, Hopeless 0  PHQ - 2 Score 0    Assessment and Plan: 56 y.o. female with   1. Encounter for annual physical exam - reviewed health maintenance - declines Pap smear today - overdue for Mammogram. Ordered 2/18 but has not scheduled - Colonoscopy scheduled with [facility]  2. Recurrent herpes simplex virus (HSV) infection  of buttock - no lesion on exam today. Continue Valtrex as needed. Refills provided - valACYclovir (VALTREX) 500 MG tablet; Take 1 tab bid for 3 days as needed for outbreaks  Dispense: 30 tablet; Refill: 5  3. Overweight (BMI 25.0-29.9) - therapeutic lifestyle changes  4. 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 Need for Tdap vaccination - Tdap vaccine greater than or equal to 7yo IM  Patient education and anticipatory guidance given Patient agrees with treatment plan Follow-up in 6 months for medication management as needed if symptoms worsen or fail to improve  Darlyne Russian PA-C

## 2017-07-02 NOTE — Patient Instructions (Addendum)
Call [phone] to schedule mammogram  Return in the next 3 months for Pap smear  Follow-up with Neurology to discuss coming off of [facility] Years, Female Preventive care refers to lifestyle choices and visits with your health care provider that can promote health and wellness. What does preventive care include?  A yearly physical exam. This is also called an annual well check.  Dental exams once or twice a year.  Routine eye exams. Ask your health care provider how often you should have your eyes checked.  Personal lifestyle choices, including: ? Daily care of your teeth and gums. ? Regular physical activity. ? Eating a healthy diet. ? Avoiding tobacco and drug use. ? Limiting alcohol use. ? Practicing safe sex. ? Taking low-dose aspirin daily starting at age 32. ? Taking vitamin and mineral supplements as recommended by your health care provider. What happens during an annual well check? The services and screenings done by your health care provider during your annual well check will depend on your age, overall health, lifestyle risk factors, and family history of disease. Counseling Your health care provider may ask you questions about your:  Alcohol use.  Tobacco use.  Drug use.  Emotional well-being.  Home and relationship well-being.  Sexual activity.  Eating habits.  Work and work Statistician.  Method of birth control.  Menstrual cycle.  Pregnancy history.  Screening You may have the following tests or measurements:  Height, weight, and BMI.  Blood pressure.  Lipid and cholesterol levels. These may be checked every 5 years, or more frequently if you are over 56 years old.  Skin check.  Lung cancer screening. You may have this screening every year starting at age 30 if you have a 30-pack-year history of smoking and currently smoke or have quit within the past 15 years.  Fecal occult blood test (FOBT) of the stool. You  may have this test every year starting at age 63.  Flexible sigmoidoscopy or colonoscopy. You may have a sigmoidoscopy every 5 years or a colonoscopy every 10 years starting at age 22.  Hepatitis C blood test.  Hepatitis B blood test.  Sexually transmitted disease (STD) testing.  Diabetes screening. This is done by checking your blood sugar (glucose) after you have not eaten for a while (fasting). You may have this done every 1-3 years.  Mammogram. This may be done every 1-2 years. Talk to your health care provider about when you should start having regular mammograms. This may depend on whether you have a family history of breast cancer.  BRCA-related cancer screening. This may be done if you have a family history of breast, ovarian, tubal, or peritoneal cancers.  Pelvic exam and Pap test. This may be done every 3 years starting at age 46. Starting at age 56, this may be done every 5 years if you have a Pap test in combination with an HPV test.  Bone density scan. This is done to screen for osteoporosis. You may have this scan if you are at high risk for osteoporosis.  Discuss your test results, treatment options, and if necessary, the need for more tests with your health care provider. Vaccines Your health care provider may recommend certain vaccines, such as:  Influenza vaccine. This is recommended every year.  Tetanus, diphtheria, and acellular pertussis (Tdap, Td) vaccine. You may need a Td booster every 10 years.  Varicella vaccine. You may need this if you have not been vaccinated.  Zoster vaccine.  You may need this after age 90.  Measles, mumps, and rubella (MMR) vaccine. You may need at least one dose of MMR if you were born in 1957 or later. You may also need a second dose.  Pneumococcal 13-valent conjugate (PCV13) vaccine. You may need this if you have certain conditions and were not previously vaccinated.  Pneumococcal polysaccharide (PPSV23) vaccine. You may need one  or two doses if you smoke cigarettes or if you have certain conditions.  Meningococcal vaccine. You may need this if you have certain conditions.  Hepatitis A vaccine. You may need this if you have certain conditions or if you travel or work in places where you may be exposed to hepatitis A.  Hepatitis B vaccine. You may need this if you have certain conditions or if you travel or work in places where you may be exposed to hepatitis B.  Haemophilus influenzae type b (Hib) vaccine. You may need this if you have certain conditions.  Talk to your health care provider about which screenings and vaccines you need and how often you need them. This information is not intended to replace advice given to you by your health care provider. Make sure you discuss any questions you have with your health care provider. Document Released: 11/03/2015 Document Revised: 06/26/2016 Document Reviewed: 08/08/2015 Elsevier Interactive Patient Education  2017 [employer].

## 2017-09-01 ENCOUNTER — Other Ambulatory Visit: Payer: Self-pay | Admitting: Physician Assistant

## 2017-10-01 ENCOUNTER — Other Ambulatory Visit (HOSPITAL_COMMUNITY)
Admission: RE | Admit: 2017-10-01 | Discharge: 2017-10-01 | Disposition: A | Discharge status: Home / Self Care | Payer: BLUE CROSS/BLUE SHIELD | Source: Ambulatory Visit | Attending: Physician Assistant | Admitting: Physician Assistant

## 2017-10-01 ENCOUNTER — Ambulatory Visit (INDEPENDENT_AMBULATORY_CARE_PROVIDER_SITE_OTHER): Payer: BLUE CROSS/BLUE SHIELD

## 2017-10-01 ENCOUNTER — Ambulatory Visit (INDEPENDENT_AMBULATORY_CARE_PROVIDER_SITE_OTHER): Payer: BLUE CROSS/BLUE SHIELD | Admitting: Physician Assistant

## 2017-10-01 ENCOUNTER — Encounter: Payer: Self-pay | Admitting: Physician Assistant

## 2017-10-01 VITALS — BP 119/80 | HR 79

## 2017-10-01 DIAGNOSIS — N952 Postmenopausal atrophic vaginitis: Secondary | ICD-10-CM | POA: Insufficient documentation

## 2017-10-01 DIAGNOSIS — Z124 Encounter for screening for malignant neoplasm of cervix: Secondary | ICD-10-CM | POA: Insufficient documentation

## 2017-10-01 DIAGNOSIS — Z1231 Encounter for screening mammogram for malignant neoplasm of breast: Secondary | ICD-10-CM

## 2017-10-01 NOTE — Progress Notes (Signed)
HPI:                                                                Jessica Ritter is a 56 y.o. female who presents to [place]: [place] today for Pap smear  She is accompanied by her husband today, who is her caregiver.  Denies any concerns today. No history of abnormal Pap smears. Not currently sexually active. No recent herpes outbreaks.  Past Medical History:  Diagnosis Date  . Cancer [facility])    metastatic bladder  . CVA (cerebral vascular accident) ([place]) 03/2015   left hemiparesis  . Diastolic dysfunction 0/53/9767   Grade 1  . 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 History of malignant neoplasm metastatic to lung 2002  . History of malignant neoplasm of brain 2003   secondary, renal  . History of malignant neoplasm of renal pelvis 1998  . History of multiple pulmonary nodules 2002  . Hyperlipidemia   . Subdural hematoma caused by concussion ([place]) 04/2014   Past Surgical History:  Procedure Laterality Date  . brain tumor surgery    . KIDNEY SURGERY    . LEG SURGERY    . LUNG LOBECTOMY Right 2002   RLL   Social History   Tobacco Use  . Smoking status: Former Smoker    Types: Cigarettes    Last attempt to quit: 07/21/1997    Years since quitting: 20.2  . Smokeless tobacco: Never Used  Substance Use Topics  . Alcohol use: Yes    Alcohol/week: 2.4 oz    Types: 2 Standard drinks or equivalent, 2 Glasses of wine per week   family history includes Cancer in her unknown relative.  ROS: negative except as noted in the HPI  Medications: Current Outpatient Medications  Medication Sig Dispense Refill  . aspirin EC 81 MG tablet Take 81 mg by mouth daily.    Marland Kitchen atorvastatin (LIPITOR) 80 MG tablet TAKE 1 TABLET BY MOUTH ONCE DAILY AT  6  PM 90 tablet 1  . levETIRAcetam (KEPPRA) 750 MG tablet TAKE ONE TABLET BY MOUTH TWICE DAILY 180 tablet 1  . omeprazole (PRILOSEC) 20 MG capsule Take 20  mg by mouth daily.    . valACYclovir (VALTREX) 500 MG tablet Take 1 tab bid for 3 days as needed for outbreaks 30 tablet 5   No current facility-administered medications for this visit.    Allergies  Allergen Reactions  . Bee Venom Other (See Comments)  . Anticoagulant Compound     Subdural Hematoma  . Ciprofloxacin     Vomiting        Objective:  BP 119/80   Pulse 79   SpO2 98%  Gen:  alert, not ill-appearing, no distress, appropriate for age GU: vulva without rashes or lesions, introitus narrow, normal urethral meatus, vaginal mucosa with atrophic changes, scant amount of normal discharge, cervix non-friable without lesions, adnexa without masses or tenderness  A chaperone was used for the GU portion of the exam, Retia Passe, CMA.   No results found for this or any previous visit (from the past 72 hour(s)). No results found.    Assessment and Plan: 56 y.o. female with   1. Encounter for Pap smear of  cervix with HPV DNA cotesting - Cytology - PAP pending - counseled on well woman issues at this age - mammogram is scheduled for today  2. Post-menopausal atrophic vaginitis - not sexually active, no dysuria or recurrent UTI, does not want to treat at this time   Patient education and anticipatory guidance given Patient agrees with treatment plan Follow-up as needed  Darlyne Russian PA-C

## 2017-10-01 NOTE — Progress Notes (Signed)
Mammogram was negative Recommend repeat breast cancer screening in 1 year

## 2017-10-01 NOTE — Patient Instructions (Addendum)
-   please schedule annual follow-up appointment with Neurology [facility])  [address]  [place],  [id number]  [phone]

## 2017-10-02 ENCOUNTER — Encounter: Payer: Self-pay | Admitting: Physician Assistant

## 2017-10-02 LAB — CYTOLOGY - PAP
Diagnosis: NEGATIVE
HPV: NOT DETECTED

## 2017-10-02 NOTE — Progress Notes (Signed)
Normal Pap smear Recommend repeat screening in 5 years

## 2017-10-03 ENCOUNTER — Ambulatory Visit: Payer: BLUE CROSS/BLUE SHIELD | Admitting: Physician Assistant

## 2017-11-16 ENCOUNTER — Other Ambulatory Visit: Payer: Self-pay | Admitting: Physician Assistant

## 2017-11-16 DIAGNOSIS — G40509 Epileptic seizures related to external causes, not intractable, without status epilepticus: Secondary | ICD-10-CM

## 2018-01-30 ENCOUNTER — Encounter: Payer: Self-pay | Admitting: Physician Assistant

## 2018-01-30 ENCOUNTER — Ambulatory Visit: Payer: BLUE CROSS/BLUE SHIELD | Admitting: Physician Assistant

## 2018-02-18 DIAGNOSIS — I6930 Unspecified sequelae of cerebral infarction: Secondary | ICD-10-CM | POA: Diagnosis not present

## 2018-02-18 DIAGNOSIS — G8191 Hemiplegia, unspecified affecting right dominant side: Secondary | ICD-10-CM | POA: Diagnosis not present

## 2018-02-18 DIAGNOSIS — Z8679 Personal history of other diseases of the circulatory system: Secondary | ICD-10-CM | POA: Diagnosis not present

## 2018-02-18 DIAGNOSIS — E854 Organ-limited amyloidosis: Secondary | ICD-10-CM | POA: Diagnosis not present

## 2018-02-23 ENCOUNTER — Other Ambulatory Visit: Payer: Self-pay | Admitting: Physician Assistant

## 2018-03-22 ENCOUNTER — Other Ambulatory Visit: Payer: Self-pay | Admitting: Physician Assistant

## 2018-03-31 DIAGNOSIS — E854 Organ-limited amyloidosis: Secondary | ICD-10-CM | POA: Diagnosis not present

## 2018-03-31 DIAGNOSIS — I6930 Unspecified sequelae of cerebral infarction: Secondary | ICD-10-CM | POA: Diagnosis not present

## 2018-03-31 DIAGNOSIS — I680 Cerebral amyloid angiopathy: Secondary | ICD-10-CM | POA: Diagnosis not present

## 2018-03-31 DIAGNOSIS — I6310 Cerebral infarction due to embolism of unspecified precerebral artery: Secondary | ICD-10-CM | POA: Diagnosis not present

## 2018-04-03 ENCOUNTER — Ambulatory Visit (INDEPENDENT_AMBULATORY_CARE_PROVIDER_SITE_OTHER): Payer: BLUE CROSS/BLUE SHIELD | Admitting: Physician Assistant

## 2018-04-03 ENCOUNTER — Encounter: Payer: Self-pay | Admitting: Physician Assistant

## 2018-04-03 VITALS — BP 119/84 | HR 93 | Wt 174.0 lb

## 2018-04-03 DIAGNOSIS — I519 Heart disease, unspecified: Secondary | ICD-10-CM

## 2018-04-03 DIAGNOSIS — I5189 Other ill-defined heart diseases: Secondary | ICD-10-CM

## 2018-04-03 DIAGNOSIS — I10 Essential (primary) hypertension: Secondary | ICD-10-CM

## 2018-04-03 DIAGNOSIS — R269 Unspecified abnormalities of gait and mobility: Secondary | ICD-10-CM

## 2018-04-03 DIAGNOSIS — B009 Herpesviral infection, unspecified: Secondary | ICD-10-CM | POA: Diagnosis not present

## 2018-04-03 DIAGNOSIS — B0089 Other herpesviral infection: Secondary | ICD-10-CM

## 2018-04-03 DIAGNOSIS — Z5181 Encounter for therapeutic drug level monitoring: Secondary | ICD-10-CM | POA: Diagnosis not present

## 2018-04-03 DIAGNOSIS — Z9181 History of falling: Secondary | ICD-10-CM | POA: Diagnosis not present

## 2018-04-03 DIAGNOSIS — E782 Mixed hyperlipidemia: Secondary | ICD-10-CM | POA: Diagnosis not present

## 2018-04-03 DIAGNOSIS — I69398 Other sequelae of cerebral infarction: Secondary | ICD-10-CM

## 2018-04-03 DIAGNOSIS — R7989 Other specified abnormal findings of blood chemistry: Secondary | ICD-10-CM

## 2018-04-03 HISTORY — DX: Other sequelae of cerebral infarction: I69.398

## 2018-04-03 MED ORDER — VALACYCLOVIR HCL 500 MG PO TABS: ORAL_TABLET | ORAL | 5 refills | Status: DC

## 2018-04-03 MED ORDER — ATORVASTATIN CALCIUM 80 MG PO TABS: 80.0000 mg | ORAL_TABLET | Freq: Every day | ORAL | 1 refills | Status: DC

## 2018-04-03 NOTE — Patient Instructions (Signed)
Follow-up with Dr. Lynnette Caffey for Keppra refills

## 2018-04-03 NOTE — Progress Notes (Signed)
HPI:                                                                Jessica Ritter is a 57 y.o. female who presents to [place]: [place] today for medication management    Hx of CVA/seizure disorder: followed by Dr. Lynnette Caffey. Normal EEG in May. Still taking Keppra 750 twice a day and baby asa. Has not had a seizure in over 2 years.  Hx of metastatic RCC: followed by Dr. Audria Nine at [facility].  HLD: LDL goal <70. Taking Atorvastatin 80 mg. Compliant with medication. Denies myalgias.  HSV: outbreak last week on buttock, resolved with Valtrex. Requesting refills.  Depression screen [facility] 2/9 07/02/2017  Decreased Interest 0  Down, Depressed, Hopeless 0  PHQ - 2 Score 0    No flowsheet data found.    Past Medical History:  Diagnosis Date  . Cancer [facility])    metastatic bladder  . CVA (cerebral vascular accident) ([place]) 03/2015   left hemiparesis  . Diastolic dysfunction 01/27/8118   Grade 1  . 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 Gait disturbance, post-stroke 04/03/2018  . History of malignant neoplasm metastatic to lung 2002  . History of malignant neoplasm of brain 2003   secondary, renal  . History of malignant neoplasm of renal pelvis 1998  . History of multiple pulmonary nodules 2002  . Hyperlipidemia   . Subdural hematoma caused by concussion ([place]) 04/2014   Past Surgical History:  Procedure Laterality Date  . brain tumor surgery    . KIDNEY SURGERY    . LEG SURGERY    . LUNG LOBECTOMY Right 2002   RLL   Social History   Tobacco Use  . Smoking status: Former Smoker    Types: Cigarettes    Last attempt to quit: 07/21/1997    Years since quitting: 20.7  . Smokeless tobacco: Never Used  Substance Use Topics  . Alcohol use: Yes    Alcohol/week: 2.4 oz    Types: 2 Standard drinks or equivalent, 2 Glasses of wine per week   family history includes Cancer in her unknown  relative.    ROS: negative except as noted in the HPI  Medications: Current Outpatient Medications  Medication Sig Dispense Refill  . aspirin EC 81 MG tablet Take 81 mg by mouth daily.    Marland Kitchen CALCIUM CITRATE PO Take by mouth.    . Cholecalciferol (VITAMIN D3) 2000 units TABS Take by mouth.    . levETIRAcetam (KEPPRA) 750 MG tablet TAKE 1 TABLET BY MOUTH TWICE DAILY 180 tablet 1  . atorvastatin (LIPITOR) 80 MG tablet Take 1 tablet (80 mg total) by mouth daily at 6 PM. 90 tablet 1  . valACYclovir (VALTREX) 500 MG tablet Take 1 tab bid for 3 days as needed for outbreaks 30 tablet 5   No current facility-administered medications for this visit.    Allergies  Allergen Reactions  . Bee Venom Other (See Comments)  . Anticoagulant Compound     Subdural Hematoma  . Ciprofloxacin     Vomiting        Objective:  BP 119/84   Pulse 93   Wt 174 lb (78.9 kg)  BMI 28.08 kg/m  Gen:  alert, not ill-appearing, no distress, appropriate for age 35: head normocephalic without obvious abnormality, conjunctiva and cornea clear, trachea midline Pulm: Normal work of breathing, normal phonation, clear to auscultation bilaterally, no wheezes, rales or rhonchi CV: Normal rate, regular rhythm, s1 and s2 distinct, no murmurs, clicks or rubs  Neuro: alert and oriented x 3, no tremor MSK: extremities atraumatic, slowed, shuffling gait, walks with assistance of her husband,  Skin: intact, no rashes on exposed skin, no jaundice, no cyanosis Psych: well-groomed, cooperative, good eye contact, euthymic mood, affect mood-congruent, speech is slowed and dysarthric     No results found for this or any previous visit (from the past 72 hour(s)). No results found.    Assessment and Plan: 57 y.o. female with   Medication monitoring encounter - Plan: COMPLETE METABOLIC PANEL WITH GFR  Gait disturbance, post-stroke  Recurrent herpes simplex virus (HSV) infection of buttock - Plan: valACYclovir  (VALTREX) 500 MG tablet  Serum creatinine raised - Plan: COMPLETE METABOLIC PANEL WITH GFR  Grade I diastolic dysfunction  Hypertension goal BP (blood pressure) < 130/80  HTN BP Readings from Last 3 Encounters:  04/03/18 119/84  10/01/17 119/80  07/02/17 119/84  - controlled with diet/lifestyle - cont asa 81 mg for secondary prevention  Gait disturance, post-stroke - noted a near miss fall with [facility] getting on the scale today. Her gait seems more imbalanced and slower to me than her baseline. Husband and patient state it is not a concern. Deny falls at home - high fall risk, counseled on prevention - advised to contact Neuro for Keppra refills  Serum creatinine raised - rechecking renal function today - last Scr 1.05, 05/2017 - avoid nephrotoxins. meds do not need to be renally dosed  Mixed HLD - last LDL 71, she is compliant with high dose statin therapy. I do not see the need to recheck fasting lipids today. Will spread out to [employer]  Patient education and anticipatory guidance given Patient agrees with treatment plan Follow-up in 6 months or sooner as needed if symptoms worsen or fail to improve  Darlyne Russian PA-C

## 2018-04-04 LAB — COMPLETE METABOLIC PANEL WITH GFR
AG Ratio: 1.9 (calc) (ref 1.0–2.5)
ALT: 20 U/L (ref 6–29)
AST: 18 U/L (ref 10–35)
Albumin: 4.5 g/dL (ref 3.6–5.1)
Alkaline phosphatase (APISO): 84 U/L (ref 33–130)
BUN: 22 mg/dL (ref 7–25)
CO2: 29 mmol/L (ref 20–32)
Calcium: 9.6 mg/dL (ref 8.6–10.4)
Chloride: 105 mmol/L (ref 98–110)
Creat: 0.99 mg/dL (ref 0.50–1.05)
GFR, Est African American: 74 mL/min/{1.73_m2} (ref >=60)
GFR, Est Non African American: 64 mL/min/{1.73_m2} (ref >=60)
Globulin: 2.4 g/dL (calc) (ref 1.9–3.7)
Glucose, Bld: 95 mg/dL (ref 65–99)
Potassium: 4.4 mmol/L (ref 3.5–5.3)
Sodium: 142 mmol/L (ref 135–146)
Total Bilirubin: 1.3 mg/dL — ABNORMAL HIGH (ref 0.2–1.2)
Total Protein: 6.9 g/dL (ref 6.1–8.1)

## 2018-05-21 DIAGNOSIS — Z8719 Personal history of other diseases of the digestive system: Secondary | ICD-10-CM

## 2018-05-21 HISTORY — DX: Personal history of other diseases of the digestive system: Z87.19

## 2018-05-21 HISTORY — PX: COLONOSCOPY W/ POLYPECTOMY: SHX1380

## 2018-06-18 DIAGNOSIS — D125 Benign neoplasm of sigmoid colon: Secondary | ICD-10-CM | POA: Diagnosis not present

## 2018-06-18 DIAGNOSIS — Z1211 Encounter for screening for malignant neoplasm of colon: Secondary | ICD-10-CM | POA: Diagnosis not present

## 2018-06-18 DIAGNOSIS — K5730 Diverticulosis of large intestine without perforation or abscess without bleeding: Secondary | ICD-10-CM | POA: Diagnosis not present

## 2018-06-18 DIAGNOSIS — D122 Benign neoplasm of ascending colon: Secondary | ICD-10-CM | POA: Diagnosis not present

## 2018-06-18 DIAGNOSIS — D123 Benign neoplasm of transverse colon: Secondary | ICD-10-CM | POA: Diagnosis not present

## 2018-06-18 LAB — HM COLONOSCOPY

## 2018-06-19 DIAGNOSIS — Z85528 Personal history of other malignant neoplasm of kidney: Secondary | ICD-10-CM | POA: Diagnosis not present

## 2018-06-19 DIAGNOSIS — I10 Essential (primary) hypertension: Secondary | ICD-10-CM | POA: Diagnosis not present

## 2018-06-19 DIAGNOSIS — Z881 Allergy status to other antibiotic agents status: Secondary | ICD-10-CM | POA: Diagnosis not present

## 2018-06-19 DIAGNOSIS — G40909 Epilepsy, unspecified, not intractable, without status epilepticus: Secondary | ICD-10-CM | POA: Diagnosis not present

## 2018-06-19 DIAGNOSIS — K91840 Postprocedural hemorrhage and hematoma of a digestive system organ or structure following a digestive system procedure: Secondary | ICD-10-CM | POA: Diagnosis not present

## 2018-06-19 DIAGNOSIS — Z85118 Personal history of other malignant neoplasm of bronchus and lung: Secondary | ICD-10-CM | POA: Diagnosis not present

## 2018-06-19 DIAGNOSIS — Z888 Allergy status to other drugs, medicaments and biological substances status: Secondary | ICD-10-CM | POA: Diagnosis not present

## 2018-06-19 DIAGNOSIS — Z8551 Personal history of malignant neoplasm of bladder: Secondary | ICD-10-CM | POA: Diagnosis not present

## 2018-06-19 DIAGNOSIS — Z79899 Other long term (current) drug therapy: Secondary | ICD-10-CM | POA: Diagnosis not present

## 2018-06-19 DIAGNOSIS — Z91038 Other insect allergy status: Secondary | ICD-10-CM | POA: Diagnosis not present

## 2018-06-19 DIAGNOSIS — Z9889 Other specified postprocedural states: Secondary | ICD-10-CM | POA: Diagnosis not present

## 2018-06-19 DIAGNOSIS — K921 Melena: Secondary | ICD-10-CM | POA: Diagnosis not present

## 2018-06-19 DIAGNOSIS — Z8673 Personal history of transient ischemic attack (TIA), and cerebral infarction without residual deficits: Secondary | ICD-10-CM | POA: Diagnosis not present

## 2018-06-19 DIAGNOSIS — Z7982 Long term (current) use of aspirin: Secondary | ICD-10-CM | POA: Diagnosis not present

## 2018-06-19 DIAGNOSIS — E785 Hyperlipidemia, unspecified: Secondary | ICD-10-CM | POA: Diagnosis not present

## 2018-06-19 DIAGNOSIS — Z91030 Bee allergy status: Secondary | ICD-10-CM | POA: Diagnosis not present

## 2018-06-19 DIAGNOSIS — K633 Ulcer of intestine: Secondary | ICD-10-CM | POA: Diagnosis not present

## 2018-06-19 DIAGNOSIS — D62 Acute posthemorrhagic anemia: Secondary | ICD-10-CM | POA: Diagnosis not present

## 2018-06-19 DIAGNOSIS — G4733 Obstructive sleep apnea (adult) (pediatric): Secondary | ICD-10-CM | POA: Diagnosis not present

## 2018-06-20 DIAGNOSIS — D62 Acute posthemorrhagic anemia: Secondary | ICD-10-CM | POA: Diagnosis not present

## 2018-06-20 DIAGNOSIS — K91840 Postprocedural hemorrhage and hematoma of a digestive system organ or structure following a digestive system procedure: Secondary | ICD-10-CM | POA: Diagnosis not present

## 2018-06-22 MED ORDER — GENERIC EXTERNAL MEDICATION
4.00 | Status: DC
Start: ? — End: 2018-06-22

## 2018-06-22 MED ORDER — LEVETIRACETAM 500 MG PO TABS
250.00 | ORAL_TABLET | ORAL | Status: DC
Start: 2018-06-20 — End: 2018-06-22

## 2018-06-22 MED ORDER — GENERIC EXTERNAL MEDICATION
650.00 | Status: DC
Start: ? — End: 2018-06-22

## 2018-06-22 MED ORDER — SODIUM CHLORIDE 0.9 % IV SOLN
75.00 | INTRAVENOUS | Status: DC
Start: ? — End: 2018-06-22

## 2018-06-22 MED ORDER — ALUM & MAG HYDROXIDE-SIMETH 200-200-20 MG/5ML PO SUSP
30.00 | ORAL | Status: DC
Start: ? — End: 2018-06-22

## 2018-06-22 MED ORDER — ATORVASTATIN CALCIUM 80 MG PO TABS
80.00 | ORAL_TABLET | ORAL | Status: DC
Start: 2018-06-20 — End: 2018-06-22

## 2018-06-22 MED ORDER — ACETAMINOPHEN 325 MG PO TABS
650.00 | ORAL_TABLET | ORAL | Status: DC
Start: ? — End: 2018-06-22

## 2018-06-22 MED ORDER — PANTOPRAZOLE SODIUM 40 MG PO TBEC
40.00 | DELAYED_RELEASE_TABLET | ORAL | Status: DC
Start: 2018-06-20 — End: 2018-06-22

## 2018-06-22 MED ORDER — NITROGLYCERIN 0.4 MG SL SUBL
0.40 | SUBLINGUAL_TABLET | SUBLINGUAL | Status: DC
Start: ? — End: 2018-06-22

## 2018-08-25 DIAGNOSIS — C7931 Secondary malignant neoplasm of brain: Secondary | ICD-10-CM | POA: Diagnosis not present

## 2018-08-25 DIAGNOSIS — S065X9S Traumatic subdural hemorrhage with loss of consciousness of unspecified duration, sequela: Secondary | ICD-10-CM | POA: Diagnosis not present

## 2018-08-25 DIAGNOSIS — I6930 Unspecified sequelae of cerebral infarction: Secondary | ICD-10-CM | POA: Diagnosis not present

## 2018-08-25 DIAGNOSIS — Z87898 Personal history of other specified conditions: Secondary | ICD-10-CM | POA: Diagnosis not present

## 2018-08-25 DIAGNOSIS — C7949 Secondary malignant neoplasm of other parts of nervous system: Secondary | ICD-10-CM | POA: Diagnosis not present

## 2018-09-15 ENCOUNTER — Ambulatory Visit: Payer: BLUE CROSS/BLUE SHIELD | Admitting: Sports Medicine

## 2018-10-05 ENCOUNTER — Ambulatory Visit (INDEPENDENT_AMBULATORY_CARE_PROVIDER_SITE_OTHER): Payer: BLUE CROSS/BLUE SHIELD | Admitting: Physician Assistant

## 2018-10-05 ENCOUNTER — Encounter: Payer: Self-pay | Admitting: Physician Assistant

## 2018-10-05 VITALS — BP 121/89 | HR 98 | Wt 180.0 lb

## 2018-10-05 DIAGNOSIS — Z1231 Encounter for screening mammogram for malignant neoplasm of breast: Secondary | ICD-10-CM

## 2018-10-05 DIAGNOSIS — Z79899 Other long term (current) drug therapy: Secondary | ICD-10-CM

## 2018-10-05 DIAGNOSIS — D62 Acute posthemorrhagic anemia: Secondary | ICD-10-CM

## 2018-10-05 DIAGNOSIS — R030 Elevated blood-pressure reading, without diagnosis of hypertension: Secondary | ICD-10-CM

## 2018-10-05 DIAGNOSIS — R7301 Impaired fasting glucose: Secondary | ICD-10-CM

## 2018-10-05 DIAGNOSIS — E782 Mixed hyperlipidemia: Secondary | ICD-10-CM | POA: Diagnosis not present

## 2018-10-05 LAB — POCT HEMOGLOBIN: Hemoglobin: 11.9 g/dL (ref 11–14.6)

## 2018-10-05 LAB — POCT GLYCOSYLATED HEMOGLOBIN (HGB A1C): HbA1c POC (<> result, manual entry): 5.6 % (ref 4.0–5.6)

## 2018-10-05 MED ORDER — ATORVASTATIN CALCIUM 80 MG PO TABS: 80.0000 mg | ORAL_TABLET | Freq: Every day | ORAL | 1 refills | Status: DC

## 2018-10-05 NOTE — Progress Notes (Signed)
HPI:                                                                Jessica Ritter is a 57 y.o. female who presents to [place]: [place] today for med mgmt  Hx of CVA/Hx of seizure: followed by Dr. Veverly Ritter, [place] [facility], Q40months. She was taken off of Keppra approx 3 months ago and is doing well. No seizures since. She has residual dysarthria, mild left upper extremity weakness and gait difficulty.  She is on baby asa for secondary prophylaxis due to amyloid angiopathy.  She had her colonoscopy with [facility] on 06/18/18 and had multiple polyps removed. Two days later she had a small lower GI bleed and was hospitalized 1 night at [facility]. She underwent a flex sig with epinephrine injection and 2 hemoclips placed. Her hgb was 11.8 on admission and 12.2 on discharge. Husband is very upset that this hospitalization occurred and cost him a great deal of out-of-pocket expense. States he is talking to Jessica Ritter about his medical bill. Patient reports she has not had any additional melena or hematochezia. She does not feel weak or short of breath.   Depression screen [facility] 2/9 10/08/2018 07/02/2017  Decreased Interest 0 0  Down, Depressed, Hopeless 0 0  PHQ - 2 Score 0 0   Fall Risk  10/08/2018 07/02/2017  Falls in the past year? 1 No  Number falls in past yr: 1 -  Injury with Fall? 0 -  Risk for fall due to : History of fall(s);Impaired balance/gait;Other (Comment) Impaired mobility;Impaired balance/gait  Follow up Education provided -     No flowsheet data found.    Past Medical History:  Diagnosis Date  . Cancer [facility])    metastatic bladder  . CVA (cerebral vascular accident) ([place]) 03/2015   left hemiparesis  . Diastolic dysfunction 06/22/4096   Grade 1  . 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 Gait disturbance, post-stroke 04/03/2018  . History of malignant neoplasm metastatic to lung  2002  . History of malignant neoplasm of brain 2003   secondary, renal  . History of malignant neoplasm of renal pelvis 1998  . History of multiple pulmonary nodules 2002  . Hyperlipidemia   . Subdural hematoma caused by concussion ([place]) 04/2014   Past Surgical History:  Procedure Laterality Date  . brain tumor surgery    . KIDNEY SURGERY    . LEG SURGERY    . LUNG LOBECTOMY Right 2002   RLL   Social History   Tobacco Use  . Smoking status: Former Smoker    Types: Cigarettes    Last attempt to quit: 07/21/1997    Years since quitting: 21.2  . Smokeless tobacco: Never Used  Substance Use Topics  . Alcohol use: Yes    Alcohol/week: 4.0 standard drinks    Types: 2 Standard drinks or equivalent, 2 Glasses of wine per week   family history includes Cancer in her unknown relative.    ROS: negative except as noted in the HPI  Medications: Current Outpatient Medications  Medication Sig Dispense Refill  . aspirin EC 81 MG tablet Take 81 mg by mouth daily.    Marland Kitchen atorvastatin (LIPITOR) 80 MG  tablet Take 1 tablet (80 mg total) by mouth daily at 6 PM. 90 tablet 1  . CALCIUM CITRATE PO Take by mouth.    . Cholecalciferol (VITAMIN D3) 2000 units TABS Take by mouth.    . valACYclovir (VALTREX) 500 MG tablet Take 1 tab bid for 3 days as needed for outbreaks 30 tablet 5   No current facility-administered medications for this visit.    Allergies  Allergen Reactions  . Bee Venom Other (See Comments)  . Anticoagulant Compound     Subdural Hematoma  . Ciprofloxacin     Vomiting        Objective:  BP 121/89   Pulse 98   Wt 180 lb (81.6 kg)   BMI 29.05 kg/m  Gen:  alert, not ill-appearing, no distress, appropriate for age 39: head normocephalic without obvious abnormality, conjunctiva and cornea clear, trachea midline Pulm: Normal work of breathing, clear to auscultation bilaterally, no wheezes, rales or rhonchi CV: Normal rate, regular rhythm, s1 and s2 distinct, no  murmurs, clicks or rubs  Neuro: alert and oriented x 3, no tremor, dysarthric speech MSK: extremities atraumatic, slow, imbalanced gait and station Skin: intact, no rashes on exposed skin, no jaundice, no cyanosis Psych: well-groomed, cooperative, good eye contact, euthymic mood, affect mood-congruent, speech is articulate, and thought processes clear and goal-directed  BP Readings from Last 3 Encounters:  10/05/18 121/89  04/03/18 119/84  10/01/17 119/80    Lab Results  Component Value Date   CREATININE 0.99 04/03/2018   BUN 22 04/03/2018   NA 142 04/03/2018   K 4.4 04/03/2018   CL 105 04/03/2018   CO2 29 04/03/2018   Lab Results  Component Value Date   ALT 20 04/03/2018   AST 18 04/03/2018   ALKPHOS 71 02/04/2017   BILITOT 1.3 (H) 04/03/2018   Lab Results  Component Value Date   CHOL 149 02/04/2017   HDL 49 (L) 02/04/2017   LDLCALC 63 02/04/2017   LDLDIRECT 71 02/04/2017   TRIG 187 (H) 02/04/2017   CHOLHDL 3.0 02/04/2017    Labs Care Everywhere 06/20/18 WBC 9.9 Hgb 12.2 PLT 221  Glucose 111 K 4.1 Na  No results found for this or any previous visit (from the past 72 hour(s)). No results found.    Assessment and Plan: 57 y.o. female with   .[name] was seen today for medication management.  Diagnoses and all orders for this visit:  Encounter for medication management  Breast cancer screening by mammogram -     MM 3D SCREEN BREAST BILATERAL; Future  Mixed hyperlipidemia -     atorvastatin (LIPITOR) 80 MG tablet; Take 1 tablet (80 mg total) by mouth daily at 6 PM.  Fasting hyperglycemia -     POCT HgB A1C  Anemia due to blood loss, acute -     POCT hemoglobin  Elevated blood pressure reading    - Personally reviewed PMH, PSH, PFH, medications, allergies, HM - Age-appropriate cancer screening: Colonoscopy UTD, requesting record from [place], mammogram due, order placed today; Pap UTD, due 2023 - Influenza declined - Tdap UTD  Hx of  CVA, HLD - LDL goal <70 - cont Atorvastatin 80 mg QD - BP mildly elevated in office today - BP goal <130/80 - counseled on therapeutic lifestyle changes - encouraged to monitor BP's at home - cont baby asa for secondary prevention - keep Q37m f/u with Neuro  Anemia due to acute blood loss - recent post-polypectomy lower GI bleed 3 months  ago - due to husband's immediate concern about cost of labs, a POC Hgb was performed rather than blood draw with iron studies - POC Hgb 11.9,  stable compared to 3 months ago  Fasting hyperglycemia - in revieweing labs in Care Everywhere, noted mildly elevated glucose of 111 - POC Hgb A1c in normal range, 5.6  High Fall Risk - witnessed near miss fall while getting on the scale in office today - hx of multiple falls resulting in serious injury in the past, gait disturbance and hx of seizures - fall prevention information given in office today - advised she is allowed to exercise on recumbent stationary bike, swimming and walking while supervised. Avoid treadmill, elliptical, jogging, biking outside  Patient education and anticipatory guidance given Patient agrees with treatment plan Follow-up in 6 months or sooner as needed if symptoms worsen or fail to improve  Darlyne Russian PA-C

## 2018-10-05 NOTE — Patient Instructions (Signed)
For your blood pressure: - Goal <130/80 (Ideally 120's/70's) - take your blood pressure medication in the morning (unless instructed differently) - take baby aspirin 81 mg daily to help prevent heart attack/stroke - monitor and log blood pressures at home - check around the same time each day in a relaxed setting - Limit salt to <2500 mg/day - Follow DASH (Dietary Approach to Stopping Hypertension) eating plan - Try to get at least 150 minutes of aerobic exercise per week - Aim to go on a brisk walk 30 minutes per day at least 5 days per week. If you're not active, gradually increase how long you walk by 5 minutes each week - limit alcohol: 2 standard drinks per day for men and 1 per day for women - avoid tobacco/nicotine products. Consider smoking cessation if you smoke - weight loss: 7% of current body weight can reduce your blood pressure by 5-10 points - follow-up at least every 6 months for your blood pressure. Follow-up sooner if your BP is not controlled  Fall Prevention in the Home Falls can cause injuries and can affect people from all age groups. There are many simple things that you can do to make your home safe and to help prevent falls. What can I do on the outside of my home?  Regularly repair the edges of walkways and driveways and fix any cracks.  Remove high doorway thresholds.  Trim any shrubbery on the main path into your home.  Use bright outdoor lighting.  Clear walkways of debris and clutter, including tools and rocks.  Regularly check that handrails are securely fastened and in good repair. Both sides of any steps should have handrails.  Install guardrails along the edges of any raised decks or porches.  Have leaves, snow, and ice cleared regularly.  Use sand or salt on walkways during winter months.  In the garage, clean up any spills right away, including grease or oil spills. What can I do in the bathroom?  Use night lights.  Install grab bars by  the toilet and in the tub and shower. Do not use towel bars as grab bars.  Use non-skid mats or decals on the floor of the tub or shower.  If you need to sit down while you are in the shower, use a plastic, non-slip stool.  Keep the floor dry. Immediately clean up any water that spills on the floor.  Remove soap buildup in the tub or shower on a regular basis.  Attach bath mats securely with double-sided non-slip rug tape.  Remove throw rugs and other tripping hazards from the floor. What can I do in the bedroom?  Use night lights.  Make sure that a bedside light is easy to reach.  Do not use oversized bedding that drapes onto the floor.  Have a firm chair that has side arms to use for getting dressed.  Remove throw rugs and other tripping hazards from the floor. What can I do in the kitchen?  Clean up any spills right away.  Avoid walking on wet floors.  Place frequently used items in easy-to-reach places.  If you need to reach for something above you, use a sturdy step stool that has a grab bar.  Keep electrical cables out of the way.  Do not use floor polish or wax that makes floors slippery. If you have to use wax, make sure that it is non-skid floor wax.  Remove throw rugs and other tripping hazards from the floor. What can  I do in the stairways?  Do not leave any items on the stairs.  Make sure that there are handrails on both sides of the stairs. Fix handrails that are broken or loose. Make sure that handrails are as long as the stairways.  Check any carpeting to make sure that it is firmly attached to the stairs. Fix any carpet that is loose or worn.  Avoid having throw rugs at the top or bottom of stairways, or secure the rugs with carpet tape to prevent them from moving.  Make sure that you have a light switch at the top of the stairs and the bottom of the stairs. If you do not have them, have them installed. What are some other fall prevention  tips?  Wear closed-toe shoes that fit well and support your feet. Wear shoes that have rubber soles or low heels.  When you use a stepladder, make sure that it is completely opened and that the sides are firmly locked. Have someone hold the ladder while you are using it. Do not climb a closed stepladder.  Add color or contrast paint or tape to grab bars and handrails in your home. Place contrasting color strips on the first and last steps.  Use mobility aids as needed, such as canes, walkers, scooters, and crutches.  Turn on lights if it is dark. Replace any light bulbs that burn out.  Set up furniture so that there are clear paths. Keep the furniture in the same spot.  Fix any uneven floor surfaces.  Choose a carpet design that does not hide the edge of steps of a stairway.  Be aware of any and all pets.  Review your medicines with your healthcare provider. Some medicines can cause dizziness or changes in blood pressure, which increase your risk of falling. Talk with your health care provider about other ways that you can decrease your risk of falls. This may include working with a physical therapist or trainer to improve your strength, balance, and endurance. This information is not intended to replace advice given to you by your health care provider. Make sure you discuss any questions you have with your health care provider. Document Released: 09/27/2002 Document Revised: 03/05/2016 Document Reviewed: 11/11/2014 Elsevier Interactive Patient Education  [employer].

## 2018-10-08 ENCOUNTER — Encounter: Payer: Self-pay | Admitting: Physician Assistant

## 2018-10-08 DIAGNOSIS — R030 Elevated blood-pressure reading, without diagnosis of hypertension: Secondary | ICD-10-CM | POA: Insufficient documentation

## 2019-04-05 ENCOUNTER — Ambulatory Visit (INDEPENDENT_AMBULATORY_CARE_PROVIDER_SITE_OTHER): Payer: BLUE CROSS/BLUE SHIELD | Admitting: Physician Assistant

## 2019-04-05 ENCOUNTER — Encounter: Payer: Self-pay | Admitting: Physician Assistant

## 2019-04-05 VITALS — BP 109/74 | HR 77 | Temp 97.2°F | Wt 171.0 lb

## 2019-04-05 DIAGNOSIS — E559 Vitamin D deficiency, unspecified: Secondary | ICD-10-CM

## 2019-04-05 DIAGNOSIS — Z5181 Encounter for therapeutic drug level monitoring: Secondary | ICD-10-CM

## 2019-04-05 DIAGNOSIS — Z1231 Encounter for screening mammogram for malignant neoplasm of breast: Secondary | ICD-10-CM

## 2019-04-05 DIAGNOSIS — Z8679 Personal history of other diseases of the circulatory system: Secondary | ICD-10-CM

## 2019-04-05 DIAGNOSIS — I680 Cerebral amyloid angiopathy: Secondary | ICD-10-CM

## 2019-04-05 DIAGNOSIS — Z8669 Personal history of other diseases of the nervous system and sense organs: Secondary | ICD-10-CM

## 2019-04-05 DIAGNOSIS — R739 Hyperglycemia, unspecified: Secondary | ICD-10-CM

## 2019-04-05 DIAGNOSIS — I6930 Unspecified sequelae of cerebral infarction: Secondary | ICD-10-CM

## 2019-04-05 DIAGNOSIS — M85859 Other specified disorders of bone density and structure, unspecified thigh: Secondary | ICD-10-CM

## 2019-04-05 DIAGNOSIS — I10 Essential (primary) hypertension: Secondary | ICD-10-CM

## 2019-04-05 DIAGNOSIS — Z8719 Personal history of other diseases of the digestive system: Secondary | ICD-10-CM

## 2019-04-05 DIAGNOSIS — E854 Organ-limited amyloidosis: Secondary | ICD-10-CM

## 2019-04-05 DIAGNOSIS — Z87898 Personal history of other specified conditions: Secondary | ICD-10-CM

## 2019-04-05 DIAGNOSIS — Z131 Encounter for screening for diabetes mellitus: Secondary | ICD-10-CM

## 2019-04-05 DIAGNOSIS — E782 Mixed hyperlipidemia: Secondary | ICD-10-CM

## 2019-04-05 MED ORDER — ATORVASTATIN CALCIUM 80 MG PO TABS: 80.0000 mg | ORAL_TABLET | Freq: Every day | ORAL | 1 refills | Status: DC

## 2019-04-05 MED ORDER — CALCIUM CARBONATE-VITAMIN D 600-400 MG-UNIT PO TABS: 1.0000 | ORAL_TABLET | Freq: Two times a day (BID) | ORAL | 3 refills | Status: DC

## 2019-04-05 NOTE — Progress Notes (Signed)
Virtual Visit via Telepone Note  I connected with Jessica Ritter on 04/05/19 at  8:10 AM EDT by telephone and verified that I am speaking with the correct person using two identifiers.   I discussed the limitations of evaluation and management by telemedicine and the availability of in person appointments. The patient expressed understanding and agreed to proceed.  History of Present Illness: HPI:                                                                Jessica Ritter is a 58 y.o. female   History is provided by patient and her husband, Jessica Ritter.  CC: chronic disease management  HTN: diet controlled. Does not monitor BP's at home.  Jessica Ritter has been doing well. She states she has been exercising regularly and purchased [employer] bike. Her and her husband have been dieting and exercising together and have been losing weight. Jessica Ritter has lost approx 10 lb since her last OV 6 months ago. Her goal weight is 140 lb. Denies vision change, headache, chest pain with exertion, orthopnea, lightheadedness, syncope and edema.   Hx of metastatic urothelial cancer: followed by [facility], Dr. Audria Nine. Last OV 06/09/2017. Initial diagnosis of primary renal stage IV in 1998. She had mets to the lung and brain, but has been without active disease since 2003. She has chronic asymptomatic microscopic hematuria. Per Oncology, there is no clinical correlation. They recommended cystoscopy prn.   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 She is followed by Neuro, Dr. Lynnette Caffey. Last OV 08/25/2018. She was slowly tapered off of Keppra in September 2019 and has been seizure free. Denies any altered mentation or falls. Taking baby asa daily.   Hx of GI bleed: She had her colonoscopy with [facility] on 06/18/18 and had multiple polyps removed. Two days later she had a small lower  GI bleed and was hospitalized 1 night at [facility]. She underwent a flex sig with epinephrine injection and 2 hemoclips placed. Her hgb was 11.8 on admission and 12.2 on discharge. Patient reports she has not had any additional melena or hematochezia. She does not feel weak or short of breath.  Depression screen [facility] 2/9 04/05/2019 10/08/2018 07/02/2017  Decreased Interest 0 0 0  Down, Depressed, Hopeless 0 0 0  PHQ - 2 Score 0 0 0    Fall Risk  04/05/2019 10/08/2018 07/02/2017  Falls in the past year? 0 1 No  Number falls in past yr: - 1 -  Injury with Fall? - 0 -  Risk for fall due to : Impaired balance/gait;Impaired mobility;History of fall(s) History of fall(s);Impaired balance/gait;Other (Comment) Impaired mobility;Impaired balance/gait  Follow up - Education provided -      Past Medical History:  Diagnosis Date  . Cancer [facility])    metastatic bladder  . CVA (cerebral vascular accident) ([place]) 03/2015   left hemiparesis  . Diastolic dysfunction 9/50/9326   Grade 1  . 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 Gait disturbance, post-stroke 04/03/2018  . History of lower GI bleeding 05/2018  . History of malignant neoplasm metastatic to lung 2002  . History  of malignant neoplasm of brain 2003   secondary, renal  . History of malignant neoplasm of renal pelvis 1998  . History of multiple pulmonary nodules 2002  . Hyperlipidemia   . Hypertension   . Seizures ([place]) 2016   tapered off of Keppra 06/2018  . Sleep apnea   . Subdural hematoma caused by concussion ([place]) 04/2014   Past Surgical History:  Procedure Laterality Date  . BRAIN SURGERY    . brain tumor surgery    . COLONOSCOPY W/ POLYPECTOMY  05/2018  . KIDNEY SURGERY    . LEG SURGERY    . LUNG LOBECTOMY Right 2002   RLL   Social History   Tobacco Use  . Smoking status: Former Smoker    Types: Cigarettes    Quit date: 07/21/1997    Years since quitting: 21.7  .  Smokeless tobacco: Never Used  Substance Use Topics  . Alcohol use: Yes    Alcohol/week: 4.0 standard drinks    Types: 2 Glasses of wine, 2 Standard drinks or equivalent per week   family history includes Cancer in an other family member.    ROS: negative except as noted in the HPI  Medications: Current Outpatient Medications  Medication Sig Dispense Refill  . aspirin EC 81 MG tablet Take 81 mg by mouth daily.    Marland Kitchen atorvastatin (LIPITOR) 80 MG tablet Take 1 tablet (80 mg total) by mouth daily at 6 PM. 90 tablet 1  . CALCIUM CITRATE PO Take by mouth.    . Cholecalciferol (VITAMIN D3) 2000 units TABS Take by mouth.    . valACYclovir (VALTREX) 500 MG tablet Take 1 tab bid for 3 days as needed for outbreaks 30 tablet 5  . Calcium Carbonate-Vitamin D 600-400 MG-UNIT tablet Take 1 tablet by mouth 2 (two) times daily. 180 tablet 3   No current facility-administered medications for this visit.    Allergies  Allergen Reactions  . Anticoagulant Compound Other (See Comments)    Subdural Hematoma  . Wasp Venom Protein Swelling  . Bee Venom Swelling  . Warfarin Other (See Comments)    Amyloid angiopathy.  Hih risk of fatal ICH on treatment dose anticoagulants.  Antiplatelet therapy and DVT prophylaxis is okay.  . Ciprofloxacin Nausea And Vomiting    Vomiting        Objective:  BP 109/74   Pulse 77   Temp (!) 97.2 F (36.2 C) (Oral)   Wt 171 lb (77.6 kg)   BMI 27.60 kg/m  Pulm: Normal work of breathing, normal phonation Neuro: alert and oriented x 3, speech mildly dysarthric Psych: cooperative, euthymic mood, affect mood-congruent, speech is articulate, normal rate and volume; thought processes clear and goal-directed, normal judgment, good insight   Wt Readings from Last 3 Encounters:  04/05/19 171 lb (77.6 kg)  10/05/18 180 lb (81.6 kg)  04/03/18 174 lb (78.9 kg)   BP Readings from Last 3 Encounters:  04/05/19 109/74  10/05/18 121/89  04/03/18 119/84   Pulse  Readings from Last 3 Encounters:  04/05/19 77  10/05/18 98  04/03/18 93   Lab Results  Component Value Date   CREATININE 0.99 04/03/2018   BUN 22 04/03/2018   NA 142 04/03/2018   K 4.4 04/03/2018   CL 105 04/03/2018   CO2 29 04/03/2018   Lab Results  Component Value Date   ALT 20 04/03/2018   AST 18 04/03/2018   ALKPHOS 71 02/04/2017   BILITOT 1.3 (H) 04/03/2018   Lab Results  Component Value Date   HGBA1C 5.6 10/05/2018    Lab Results  Component Value Date   WBC 4.6 02/16/2015   HGB 11.9 10/05/2018   PLT 247 02/16/2015   Lab Results  Component Value Date   CHOL 149 02/04/2017   HDL 49 (L) 02/04/2017   LDLCALC 63 02/04/2017   LDLDIRECT 71 02/04/2017   TRIG 187 (H) 02/04/2017   CHOLHDL 3.0 02/04/2017     Assessment and Plan: 58 y.o. female with   .Jeaneen was seen today for medication management.  Diagnoses and all orders for this visit:  History of CVA with residual deficit -     Lipid Profile  Mixed hyperlipidemia -     atorvastatin (LIPITOR) 80 MG tablet; Take 1 tablet (80 mg total) by mouth daily at 6 PM. -     Lipid Profile  Vitamin D deficiency -     VITAMIN D 25 Hydroxy (Vit-D Deficiency, Fractures) -     CBC  Hypertension goal BP (blood pressure) < 130/80 -     COMPLETE METABOLIC PANEL WITH GFR -     CBC  Osteopenia of hip, unspecified laterality -     Calcium Carbonate-Vitamin D 600-400 MG-UNIT tablet; Take 1 tablet by mouth 2 (two) times daily. -     VITAMIN D 25 Hydroxy (Vit-D Deficiency, Fractures) -     CBC -     DG Bone Density; Future  Medication monitoring encounter -     VITAMIN D 25 Hydroxy (Vit-D Deficiency, Fractures) -     COMPLETE METABOLIC PANEL WITH GFR -     Lipid Profile -     CBC -     Hemoglobin A1c -     CP4508-PT/INR AND PTT  Cerebral amyloid angiopathy (HCC) -     CP4508-PT/INR AND PTT  Hyperglycemia -     Hemoglobin A1c  Screening for diabetes mellitus -     Hemoglobin A1c  Breast cancer screening by  mammogram -     MM 3D SCREEN BREAST BILATERAL; Future  History of sleep apnea  History of seizure  History of subdural hemorrhage  History of lower GI bleeding   Home BP in range Applauded for exercise and weight loss efforts Cont baby asa for secondary prevention Cont high intensity statin therapy, LDL goal <70 Due for fasting labs August 2020 Keep yearly f/u with Neuro and Oncology (overdue) Reviewed health maintenance. Mammo and DEXA pending  Follow-up in office in 6 months    Follow Up Instructions:    I discussed the assessment and treatment plan with the patient. The patient was provided an opportunity to ask questions and all were answered. The patient agreed with the plan and demonstrated an understanding of the instructions.   The patient was advised to call back or seek an in-person evaluation if the symptoms worsen or if the condition fails to improve as anticipated.  I provided 5-10 minutes of non-face-to-face time during this encounter.   Trixie Dredge, [place]

## 2019-05-23 ENCOUNTER — Other Ambulatory Visit: Payer: Self-pay | Admitting: Physician Assistant

## 2019-05-23 DIAGNOSIS — B0089 Other herpesviral infection: Secondary | ICD-10-CM

## 2019-05-23 DIAGNOSIS — B009 Herpesviral infection, unspecified: Secondary | ICD-10-CM

## 2019-07-14 ENCOUNTER — Other Ambulatory Visit: Payer: Self-pay

## 2019-07-14 ENCOUNTER — Ambulatory Visit: Payer: Self-pay

## 2019-07-19 DIAGNOSIS — I10 Essential (primary) hypertension: Secondary | ICD-10-CM | POA: Diagnosis not present

## 2019-07-19 DIAGNOSIS — Z85118 Personal history of other malignant neoplasm of bronchus and lung: Secondary | ICD-10-CM | POA: Diagnosis not present

## 2019-07-19 DIAGNOSIS — R319 Hematuria, unspecified: Secondary | ICD-10-CM | POA: Diagnosis not present

## 2019-07-19 DIAGNOSIS — Z8559 Personal history of malignant neoplasm of other urinary tract organ: Secondary | ICD-10-CM | POA: Diagnosis not present

## 2019-07-19 DIAGNOSIS — C659 Malignant neoplasm of unspecified renal pelvis: Secondary | ICD-10-CM | POA: Diagnosis not present

## 2019-07-19 DIAGNOSIS — R569 Unspecified convulsions: Secondary | ICD-10-CM | POA: Diagnosis not present

## 2019-07-19 DIAGNOSIS — C7931 Secondary malignant neoplasm of brain: Secondary | ICD-10-CM | POA: Diagnosis not present

## 2019-07-19 DIAGNOSIS — Z08 Encounter for follow-up examination after completed treatment for malignant neoplasm: Secondary | ICD-10-CM | POA: Diagnosis not present

## 2019-07-19 DIAGNOSIS — Z87891 Personal history of nicotine dependence: Secondary | ICD-10-CM | POA: Diagnosis not present

## 2019-07-19 DIAGNOSIS — Z6827 Body mass index (BMI) 27.0-27.9, adult: Secondary | ICD-10-CM | POA: Diagnosis not present

## 2019-07-19 DIAGNOSIS — Z85528 Personal history of other malignant neoplasm of kidney: Secondary | ICD-10-CM | POA: Diagnosis not present

## 2019-07-28 ENCOUNTER — Ambulatory Visit (INDEPENDENT_AMBULATORY_CARE_PROVIDER_SITE_OTHER): Payer: BC Managed Care – PPO | Admitting: Family Medicine

## 2019-07-28 ENCOUNTER — Ambulatory Visit (INDEPENDENT_AMBULATORY_CARE_PROVIDER_SITE_OTHER): Payer: BC Managed Care – PPO

## 2019-07-28 ENCOUNTER — Other Ambulatory Visit: Payer: Self-pay

## 2019-07-28 VITALS — BP 118/86 | HR 96 | Wt 164.0 lb

## 2019-07-28 DIAGNOSIS — I10 Essential (primary) hypertension: Secondary | ICD-10-CM | POA: Diagnosis not present

## 2019-07-28 DIAGNOSIS — M85859 Other specified disorders of bone density and structure, unspecified thigh: Secondary | ICD-10-CM

## 2019-07-28 DIAGNOSIS — Z9181 History of falling: Secondary | ICD-10-CM | POA: Diagnosis not present

## 2019-07-28 DIAGNOSIS — R29898 Other symptoms and signs involving the musculoskeletal system: Secondary | ICD-10-CM

## 2019-07-28 DIAGNOSIS — E854 Organ-limited amyloidosis: Secondary | ICD-10-CM | POA: Diagnosis not present

## 2019-07-28 DIAGNOSIS — E559 Vitamin D deficiency, unspecified: Secondary | ICD-10-CM | POA: Diagnosis not present

## 2019-07-28 DIAGNOSIS — M85852 Other specified disorders of bone density and structure, left thigh: Secondary | ICD-10-CM | POA: Diagnosis not present

## 2019-07-28 DIAGNOSIS — I6930 Unspecified sequelae of cerebral infarction: Secondary | ICD-10-CM

## 2019-07-28 DIAGNOSIS — Z8679 Personal history of other diseases of the circulatory system: Secondary | ICD-10-CM | POA: Diagnosis not present

## 2019-07-28 DIAGNOSIS — Z780 Asymptomatic menopausal state: Secondary | ICD-10-CM | POA: Diagnosis not present

## 2019-07-28 DIAGNOSIS — Z131 Encounter for screening for diabetes mellitus: Secondary | ICD-10-CM | POA: Diagnosis not present

## 2019-07-28 DIAGNOSIS — Z1231 Encounter for screening mammogram for malignant neoplasm of breast: Secondary | ICD-10-CM | POA: Diagnosis not present

## 2019-07-28 DIAGNOSIS — I680 Cerebral amyloid angiopathy: Secondary | ICD-10-CM

## 2019-07-28 DIAGNOSIS — I69322 Dysarthria following cerebral infarction: Secondary | ICD-10-CM | POA: Diagnosis not present

## 2019-07-28 DIAGNOSIS — E782 Mixed hyperlipidemia: Secondary | ICD-10-CM | POA: Diagnosis not present

## 2019-07-28 NOTE — Progress Notes (Signed)
Jessica Ritter is a 58 y.o. female who presents to [facility] [place] [place] today for discussed disability and residual defects.  Jessica Ritter has had an extensive medical history.  She had significant cancer history about 20 years ago with excision and chemotherapy treatment and was doing pretty well until August 17 of 2015.  She suffered a fall and had a subdural hematoma which resulted in weakness and speech difficulty.  She did therapy and had some recovery but then subsequently had a stroke in 2016.  She has had significant disability since.  Her medical problems include dysarthria, gait disturbance, leg weakness.  She has not worked since 2008.  She is able to do some things at home but struggles significantly.  She does not cook independently.  She does not manage her finances.  She is able to dress herself and feed herself but needs assistance with some self-care activities such as grooming and bathing.  She is in the process of applying for disability and would like some advice and help with this.  She has hired an Materials engineer at [employer]  [phone].    ROS as above:  Exam:  BP 118/86   Pulse 96   Wt 164 lb (74.4 kg)   BMI 26.47 kg/m  Wt Readings from Last 5 Encounters:  07/28/19 164 lb (74.4 kg)  04/05/19 171 lb (77.6 kg)  10/05/18 180 lb (81.6 kg)  04/03/18 174 lb (78.9 kg)  07/02/17 169 lb (76.7 kg)    Gen: Nontoxic-appearing HEENT: EOMI,  MMM Lungs: Normal work of breathing. CTABL Heart: RRR no MRG Abd: NABS, Soft. Nondistended, Nontender Exts: Brisk capillary refill, warm and well perfused.  Neuro:  Alert.  Oriented location and person.  Oriented to October 2020 but did not know the day of the week or the date. She has significant speech difficulties with dysarthria but is able to communicate to short sentences. 3 item recall 3/3.   Months backward correct.  Delayed recall 2/3. Significant take difficulty.  Patient needs assistance to take 1 step to climb on top of the exam table.  She is able to walk however her gait is broad-based and teetering.     Assessment and Plan: 58 y.o. female with  Disability.  Jessica Ritter has some significant challenges.  Prior to her subdural hematoma and stroke she had an extensive cancer history that most people would not have survived. However since 2015 she has had a series of brain injuring events including subdural hematoma and stroke.  As result she has severe impairments in her ability to walk normally, communicate, and think.    Currently she does not manage her own affairs.  She has significant cognitive impairment.  She does not manage her finances, cook for herself and is not fully independent with self-care activities such as grooming or basic home care activities such as grooming and cleaning.   I think it is unlikely that she will have substantial improvement at this point beyond where she is now and she will remain disabled into the future.   Plan to write a letter to express my opinion as above.  I spent 25 minutes with this patient, greater than 50% was face-to-face time counseling regarding disability evaluation and assessment and professional medical opinion.Marland Kitchen   PDMP not reviewed this encounter. No orders of the defined types were placed in this encounter.  No orders of the defined types were placed in  this encounter.    Historical information moved to improve visibility of documentation.  Past Medical History:  Diagnosis Date  . Cancer [facility])    metastatic bladder  . CVA (cerebral vascular accident) ([place]) 03/2015   left hemiparesis  . Diastolic dysfunction [id number]   Grade 1  . 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 Gait disturbance, post-stroke 04/03/2018  . History of lower GI bleeding 05/2018  . History of malignant  neoplasm metastatic to lung 2002  . History of malignant neoplasm of brain 2003   secondary, renal  . History of malignant neoplasm of renal pelvis 1998  . History of multiple pulmonary nodules 2002  . Hyperlipidemia   . Hypertension   . Seizures ([place]) 2016   tapered off of Keppra 06/2018  . Sleep apnea   . Subdural hematoma caused by concussion ([place]) 04/2014   Past Surgical History:  Procedure Laterality Date  . BRAIN SURGERY    . brain tumor surgery    . COLONOSCOPY W/ POLYPECTOMY  05/2018  . KIDNEY SURGERY    . LEG SURGERY    . LUNG LOBECTOMY Right 2002   RLL   Social History   Tobacco Use  . Smoking status: Former Smoker    Types: Cigarettes    Quit date: 07/21/1997    Years since quitting: 22.0  . Smokeless tobacco: Never Used  Substance Use Topics  . Alcohol use: Yes    Alcohol/week: 4.0 standard drinks    Types: 2 Glasses of wine, 2 Standard drinks or equivalent per week   family history includes Cancer in an other family member.  Medications: Current Outpatient Medications  Medication Sig Dispense Refill  . aspirin EC 81 MG tablet Take 81 mg by mouth daily.    Marland Kitchen atorvastatin (LIPITOR) 80 MG tablet Take 1 tablet (80 mg total) by mouth daily at 6 PM. 90 tablet 1  . Calcium Carbonate-Vitamin D 600-400 MG-UNIT tablet Take 1 tablet by mouth 2 (two) times daily. 180 tablet 3  . CALCIUM CITRATE PO Take by mouth.    . Cholecalciferol (VITAMIN D3) 2000 units TABS Take by mouth.    . valACYclovir (VALTREX) 500 MG tablet TAKE 1 TABLET BY MOUTH TWICE DAILY FOR 3 DAYS AS NEEDED FOR  OUTBREAKS 30 tablet 0   No current facility-administered medications for this visit.    Allergies  Allergen Reactions  . Anticoagulant Compound Other (See Comments)    Subdural Hematoma  . Wasp Venom Protein Swelling  . Bee Venom Swelling  . Warfarin Other (See Comments)    Amyloid angiopathy.  Hih risk of fatal ICH on treatment dose anticoagulants.  Antiplatelet therapy and DVT  prophylaxis is okay.  . Ciprofloxacin Nausea And Vomiting    Vomiting      Discussed warning signs or symptoms. Please see discharge instructions. Patient expresses understanding.

## 2019-07-28 NOTE — Patient Instructions (Signed)
Thank you for coming in today. I will let you know when I have completed the letter.

## 2019-07-29 ENCOUNTER — Encounter: Payer: Self-pay | Admitting: Family Medicine

## 2019-07-29 LAB — CP4508-PT/INR AND PTT
INR: 1
Prothrombin Time: 10.5 s (ref 9.0–11.5)
aPTT: 31 s (ref 22–34)

## 2019-07-29 LAB — COMPLETE METABOLIC PANEL WITH GFR
AG Ratio: 1.8 (calc) (ref 1.0–2.5)
ALT: 20 U/L (ref 6–29)
AST: 18 U/L (ref 10–35)
Albumin: 4.6 g/dL (ref 3.6–5.1)
Alkaline phosphatase (APISO): 77 U/L (ref 37–153)
BUN: 21 mg/dL (ref 7–25)
CO2: 30 mmol/L (ref 20–32)
Calcium: 10.8 mg/dL — ABNORMAL HIGH (ref 8.6–10.4)
Chloride: 104 mmol/L (ref 98–110)
Creat: 0.93 mg/dL (ref 0.50–1.05)
GFR, Est African American: 80 mL/min/{1.73_m2} (ref >=60)
GFR, Est Non African American: 69 mL/min/{1.73_m2} (ref >=60)
Globulin: 2.6 g/dL (calc) (ref 1.9–3.7)
Glucose, Bld: 93 mg/dL (ref 65–99)
Potassium: 4.1 mmol/L (ref 3.5–5.3)
Sodium: 143 mmol/L (ref 135–146)
Total Bilirubin: 1.2 mg/dL (ref 0.2–1.2)
Total Protein: 7.2 g/dL (ref 6.1–8.1)

## 2019-07-29 LAB — CBC
HCT: 41.2 % (ref 35.0–45.0)
Hemoglobin: 13.8 g/dL (ref 11.7–15.5)
MCH: 30.3 pg (ref 27.0–33.0)
MCHC: 33.5 g/dL (ref 32.0–36.0)
MCV: 90.5 fL (ref 80.0–100.0)
MPV: 9.8 fL (ref 7.5–12.5)
Platelets: 219 10*3/uL (ref 140–400)
RBC: 4.55 10*6/uL (ref 3.80–5.10)
RDW: 12.8 % (ref 11.0–15.0)
WBC: 7.2 10*3/uL (ref 3.8–10.8)

## 2019-07-29 LAB — VITAMIN D 25 HYDROXY (VIT D DEFICIENCY, FRACTURES): Vit D, 25-Hydroxy: 46 ng/mL (ref 30–100)

## 2019-07-29 LAB — LIPID PANEL
Cholesterol: 120 mg/dL (ref <200)
HDL: 40 mg/dL — ABNORMAL LOW (ref >=50)
LDL Cholesterol (Calc): 60 mg/dL (calc)
Non-HDL Cholesterol (Calc): 80 mg/dL (calc) (ref <130)
Total CHOL/HDL Ratio: 3 (calc) (ref <5.0)
Triglycerides: 116 mg/dL (ref <150)

## 2019-07-29 LAB — HEMOGLOBIN A1C
Hgb A1c MFr Bld: 5.3 % of total Hgb (ref <5.7)
Mean Plasma Glucose: 105 (calc)
eAG (mmol/L): 5.8 (calc)

## 2019-08-02 ENCOUNTER — Encounter: Payer: Self-pay | Admitting: Family Medicine

## 2019-08-13 ENCOUNTER — Telehealth: Payer: Self-pay | Admitting: Family Medicine

## 2019-08-13 DIAGNOSIS — M899 Disorder of bone, unspecified: Secondary | ICD-10-CM

## 2019-08-13 NOTE — Telephone Encounter (Signed)
Patient's husband advised 

## 2019-08-13 NOTE — Telephone Encounter (Signed)
Left message for a return call

## 2019-08-13 NOTE — Telephone Encounter (Signed)
-----   Message from Lavon Paganini, LPN sent at [phone]  1:42 PM EDT ----- Routing for review

## 2019-08-13 NOTE — Telephone Encounter (Signed)
Lytic lesion left proximal femur/hip seen on DEXA scan requires further evaluation with left hip x-ray.  Likely will represent benign process however needs further evaluation. Patient should proceed to get an x-ray in the near future.  This is not an emergency but should happen soon.

## 2019-10-01 ENCOUNTER — Other Ambulatory Visit: Payer: Self-pay

## 2019-10-01 ENCOUNTER — Telehealth: Payer: BC Managed Care – PPO | Admitting: Nurse Practitioner

## 2019-10-01 DIAGNOSIS — Z20822 Contact with and (suspected) exposure to covid-19: Secondary | ICD-10-CM

## 2019-10-01 DIAGNOSIS — R059 Cough, unspecified: Secondary | ICD-10-CM

## 2019-10-01 DIAGNOSIS — R05 Cough: Secondary | ICD-10-CM

## 2019-10-01 MED ORDER — PROMETHAZINE-DM 6.25-15 MG/5ML PO SYRP: 5.0000 mL | ORAL_SOLUTION | Freq: Four times a day (QID) | ORAL | 0 refills | Status: AC | PRN

## 2019-10-01 NOTE — Progress Notes (Signed)
E-Visit for Corona Virus Screening   Your current symptoms could be consistent with the coronavirus.  Many health care providers can now test patients at their office but not all are. You can go to any of the locations listed to received COVID-19 testing.  We do not make appointments for patients through this service.  [facility] has multiple testing sites. For information on our COVID testing locations and hours go to [email or web address]  Please quarantine yourself while awaiting your test results.  We are enrolling you in our [place] for Jessica Ritter . Daily you will receive a questionnaire within the [facility] website. Our COVID 19 response team willl be monitoriing your responses daily. Please continue good preventive care measures, including:  frequent hand-washing, avoid touching your face, cover coughs/sneezes, stay out of crowds and keep a 6 foot distance from others.    COVID-19 is a respiratory illness with symptoms that are similar to the flu. Symptoms are typically mild to moderate, but there have been cases of severe illness and death due to the virus. The following symptoms may appear 2-14 days after exposure: . Fever . Cough . Shortness of breath or difficulty breathing . Chills . Repeated shaking with chills . Muscle pain . Headache . Sore throat . New loss of taste or smell . Fatigue . Congestion or runny nose . Nausea or vomiting . Diarrhea  If you develop fever/cough/breathlessness, please stay home for 10 days with improving symptoms and until you have had 24 hours of no fever (without taking a fever reducer).  Go to the nearest hospital ED for assessment if fever/cough/breathlessness are severe or illness seems like a threat to life.  It is vitally important that if you feel that you have an infection such as this virus or any other virus that you stay home and away from places where you may spread it to others.  You should avoid contact  with people age 106 and older.   You should wear a mask or cloth face covering over your nose and mouth if you must be around other people or animals, including pets (even at home). Try to stay at least 6 feet away from other people. This will protect the people around you.  You can use medication such as A prescription cough medication called Phenergan DM 6.25 mg/15 mg. You make take one teaspoon / 5 ml every 4-6 hours as needed for cough  You may also take acetaminophen (Tylenol) as needed for fever.   Reduce your risk of any infection by using the same precautions used for avoiding the common cold or flu:  Marland Kitchen Wash your hands often with soap and warm water for at least 20 seconds.  If soap and water are not readily available, use an alcohol-based hand sanitizer with at least 60% alcohol.  . If coughing or sneezing, cover your mouth and nose by coughing or sneezing into the elbow areas of your shirt or coat, into a tissue or into your sleeve (not your hands). . Avoid shaking hands with others and consider head nods or verbal greetings only. . Avoid touching your eyes, nose, or mouth with unwashed hands.  . Avoid close contact with people who are sick. . Avoid places or events with large numbers of people in one location, like concerts or sporting events. . Carefully consider travel plans you have or are making. . If you are planning any travel outside or inside the [place], visit the CDC's Travelers' Health webpage for the  latest health notices. . If you have some symptoms but not all symptoms, continue to monitor at home and seek medical attention if your symptoms worsen. . If you are having a medical emergency, call 911.  HOME CARE . Only take medications as instructed by your medical team. . Drink plenty of fluids and get plenty of rest. . A steam or ultrasonic humidifier can help if you have congestion.   GET HELP RIGHT AWAY IF YOU HAVE EMERGENCY WARNING SIGNS** FOR COVID-19. If you or  someone is showing any of these signs seek emergency medical care immediately. Call 911 or proceed to your closest emergency facility if: . You develop worsening high fever. . Trouble breathing . Bluish lips or face . Persistent pain or pressure in the chest . New confusion . Inability to wake or stay awake . You cough up blood. . Your symptoms become more severe  **This list is not all possible symptoms. Contact your medical provider for any symptoms that are sever or concerning to you.   MAKE SURE YOU   Understand these instructions.  Will watch your condition.  Will get help right away if you are not doing well or get worse.  Your e-visit answers were reviewed by a board certified advanced clinical practitioner to complete your personal care plan.  Depending on the condition, your plan could have included both over the counter or prescription medications.  If there is a problem please reply once you have received a response from your provider.  Your safety is important to [place].  If you have drug allergies check your prescription carefully.    You can use MyChart to ask questions about today's visit, request a non-urgent call back, or ask for a work or school excuse for 24 hours related to this e-Visit. If it has been greater than 24 hours you will need to follow up with your provider, or enter a new e-Visit to address those concerns. You will get an e-mail in the next two days asking about your experience.  I hope that your e-visit has been valuable and will speed your recovery. Thank you for using e-visits.  I have spent at least 5 minutes reviewing and documenting in the patient's chart.

## 2019-10-02 LAB — NOVEL CORONAVIRUS, NAA: SARS-CoV-2, NAA: DETECTED — AB

## 2019-11-14 ENCOUNTER — Other Ambulatory Visit: Payer: Self-pay | Admitting: Physician Assistant

## 2019-11-14 DIAGNOSIS — E782 Mixed hyperlipidemia: Secondary | ICD-10-CM

## 2020-02-13 ENCOUNTER — Other Ambulatory Visit: Payer: Self-pay | Admitting: Osteopathic Medicine

## 2020-02-13 DIAGNOSIS — E782 Mixed hyperlipidemia: Secondary | ICD-10-CM

## 2020-02-16 ENCOUNTER — Telehealth: Payer: Self-pay | Admitting: Family Medicine

## 2020-02-16 DIAGNOSIS — E782 Mixed hyperlipidemia: Secondary | ICD-10-CM

## 2020-02-16 NOTE — Telephone Encounter (Signed)
PT is scheduled on 02/22/20 at 1:00.  PT asked to refill Lipitor and to change to 30 day supply.

## 2020-02-16 NOTE — Telephone Encounter (Signed)
Attempted to call PT. Left VM.

## 2020-02-16 NOTE — Telephone Encounter (Signed)
Needs to establish with new PCP

## 2020-02-17 MED ORDER — ATORVASTATIN CALCIUM 80 MG PO TABS: ORAL_TABLET | ORAL | 0 refills | Status: DC

## 2020-02-17 NOTE — Addendum Note (Signed)
Addended by: Towana Badger on: 02/17/2020 09:23 AM   Modules accepted: Orders

## 2020-02-17 NOTE — Telephone Encounter (Signed)
30 days sent. MUST keep upcoming appt

## 2020-02-22 ENCOUNTER — Encounter: Payer: Self-pay | Admitting: Family Medicine

## 2020-02-22 ENCOUNTER — Other Ambulatory Visit: Payer: Self-pay

## 2020-02-22 ENCOUNTER — Ambulatory Visit (INDEPENDENT_AMBULATORY_CARE_PROVIDER_SITE_OTHER): Payer: 59 | Admitting: Family Medicine

## 2020-02-22 DIAGNOSIS — R29898 Other symptoms and signs involving the musculoskeletal system: Secondary | ICD-10-CM

## 2020-02-22 DIAGNOSIS — E782 Mixed hyperlipidemia: Secondary | ICD-10-CM | POA: Diagnosis not present

## 2020-02-22 MED ORDER — ATORVASTATIN CALCIUM 80 MG PO TABS: ORAL_TABLET | ORAL | 3 refills | Status: DC

## 2020-02-22 NOTE — Patient Instructions (Signed)
Very nice to meet you today! Continue current medications and see me again in about 6 months.

## 2020-02-22 NOTE — Assessment & Plan Note (Signed)
Lab Results  Component Value Date   LDLCALC 60 07/28/2019  Tolerating atorvastatin well, continue at current dose.

## 2020-02-22 NOTE — Assessment & Plan Note (Signed)
Sequelae of previous CVA. Working on obtaining disability

## 2020-02-22 NOTE — Progress Notes (Signed)
Jessica Ritter - 59 y.o. female MRN [id number]  Date of birth: December 27, 1960  Subjective No chief complaint on file.   HPI Jessica Ritter is a 59 y.o. female with extensive past medical history including metastatic urothelial cancer, fall resulting in  subdural hematoma and CVA here today for follow up.  She is accompanied by her husband today.  She reports that she is doing well.  They have been working on applying for disability however no in-person hearings have been held due to [place].  She does have left hemiparesis related to CVA.  Today she needs refill of lipitor.  She denies side effects from this.  Labs are up to date.    ROS:  A comprehensive ROS was completed and negative except as noted per HPI  Allergies  Allergen Reactions  . Anticoagulant Compound Other (See Comments)    Subdural Hematoma  . Wasp Venom Protein Swelling  . Bee Venom Swelling  . Warfarin Other (See Comments)    Amyloid angiopathy.  Hih risk of fatal ICH on treatment dose anticoagulants.  Antiplatelet therapy and DVT prophylaxis is okay.  . Ciprofloxacin Nausea And Vomiting    Vomiting     Past Medical History:  Diagnosis Date  . Cancer [facility])    metastatic bladder  . CVA (cerebral vascular accident) ([place]) 03/2015   left hemiparesis  . Diastolic dysfunction [id number]   Grade 1  . 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 Gait disturbance, post-stroke 04/03/2018  . History of lower GI bleeding 05/2018  . History of malignant neoplasm metastatic to lung 2002  . History of malignant neoplasm of brain 2003   secondary, renal  . History of malignant neoplasm of renal pelvis 1998  . History of multiple pulmonary nodules 2002  . Hyperlipidemia   . Hypertension   . Seizures ([place]) 2016   tapered off of Keppra 06/2018  . Sleep apnea   . Subdural hematoma caused by concussion ([place]) 04/2014    Past Surgical History:  Procedure Laterality Date  . BRAIN SURGERY    .  brain tumor surgery    . COLONOSCOPY W/ POLYPECTOMY  05/2018  . KIDNEY SURGERY    . LEG SURGERY    . LUNG LOBECTOMY Right 2002   RLL    Social History   Socioeconomic History  . Marital status: Married    Spouse name: Not on file  . Number of children: Not on file  . Years of education: Not on file  . Highest education level: Not on file  Occupational History  . Not on file  Tobacco Use  . Smoking status: Former Smoker    Types: Cigarettes    Quit date: 07/21/1997    Years since quitting: 22.6  . Smokeless tobacco: Never Used  Substance and Sexual Activity  . Alcohol use: Yes    Alcohol/week: 4.0 standard drinks    Types: 2 Glasses of wine, 2 Standard drinks or equivalent per week  . Drug use: No  . Sexual activity: Not Currently    Partners: Male  Other Topics Concern  . Not on file  Social History Narrative  . Not on file   Social Determinants of Health   Financial Resource Strain:   . Difficulty of Paying Living Expenses:   Food Insecurity:   . Worried About Charity fundraiser in the Last Year:   . Arboriculturist in the Last Year:   Transportation Needs:   .  Lack of Transportation (Medical):   Marland Kitchen Lack of Transportation (Non-Medical):   Physical Activity:   . Days of Exercise per Week:   . Minutes of Exercise per Session:   Stress:   . Feeling of Stress :   Social Connections:   . Frequency of Communication with Friends and Family:   . Frequency of Social Gatherings with Friends and Family:   . Attends Religious Services:   . Active Member of Clubs or Organizations:   . Attends Archivist Meetings:   Marland Kitchen Marital Status:     Family History  Problem Relation Age of Onset  . Cancer Other        mother     Health Maintenance  Topic Date Due  . Hepatitis C Screening  04/04/2020 (Originally 1961/08/26)  . INFLUENZA VACCINE  05/21/2020  . MAMMOGRAM  07/27/2021  . PAP SMEAR-Modifier  10/01/2022  . COLONOSCOPY  06/19/2023  . TETANUS/TDAP   07/03/2027  . COVID-19 Vaccine  Completed  . HIV Screening  Discontinued     ----------------------------------------------------------------------------------------------------------------------------------------------------------------------------------------------------------------- Physical Exam BP 120/84 (BP Location: Left Arm, Patient Position: Sitting, Cuff Size: Normal)   Pulse (!) 103   Ht 5' 6.14" (1.68 m)   Wt 162 lb 6.4 oz (73.7 kg)   SpO2 97%   BMI 26.10 kg/m   Physical Exam Constitutional:      Appearance: Normal appearance.  Eyes:     General: No scleral icterus. Cardiovascular:     Rate and Rhythm: Normal rate and regular rhythm.  Pulmonary:     Effort: Pulmonary effort is normal.     Breath sounds: Normal breath sounds.  Musculoskeletal:     Cervical back: Neck supple.  Neurological:     Mental Status: She is alert.     Comments: L lower extremity weakness.  Dysarthric  Psychiatric:        Mood and Affect: Mood normal.        Behavior: Behavior normal.     ------------------------------------------------------------------------------------------------------------------------------------------------------------------------------------------------------------------- Assessment and Plan  Left leg weakness Sequelae of previous CVA. Working on obtaining disability  Hyperlipidemia Lab Results  Component Value Date   LDLCALC 60 07/28/2019  Tolerating atorvastatin well, continue at current dose.    Meds ordered this encounter  Medications  . atorvastatin (LIPITOR) 80 MG tablet    Sig: TAKE 1 TABLET BY MOUTH ONCE DAILY AT  6  PM.    Dispense:  90 tablet    Refill:  3    Return in about 6 months (around 08/24/2020) for HLD/Labs.    This visit occurred during the SARS-CoV-2 public health emergency.  Safety protocols were in place, including screening questions prior to the visit, additional usage of staff PPE, and extensive cleaning of exam room  while observing appropriate contact time as indicated for disinfecting solutions.

## 2020-07-24 ENCOUNTER — Encounter: Payer: Self-pay | Admitting: Family Medicine

## 2020-08-01 NOTE — Telephone Encounter (Signed)
Letter sent via my chart

## 2020-08-16 ENCOUNTER — Other Ambulatory Visit: Payer: Self-pay | Admitting: Physician Assistant

## 2020-08-16 DIAGNOSIS — B0089 Other herpesviral infection: Secondary | ICD-10-CM

## 2020-08-16 DIAGNOSIS — B009 Herpesviral infection, unspecified: Secondary | ICD-10-CM

## 2020-08-23 ENCOUNTER — Other Ambulatory Visit: Payer: Self-pay | Admitting: Family Medicine

## 2020-08-23 DIAGNOSIS — E782 Mixed hyperlipidemia: Secondary | ICD-10-CM

## 2020-08-23 DIAGNOSIS — R7303 Prediabetes: Secondary | ICD-10-CM

## 2020-08-23 DIAGNOSIS — I10 Essential (primary) hypertension: Secondary | ICD-10-CM

## 2020-08-24 ENCOUNTER — Other Ambulatory Visit: Payer: Self-pay

## 2020-08-24 ENCOUNTER — Ambulatory Visit (INDEPENDENT_AMBULATORY_CARE_PROVIDER_SITE_OTHER): Payer: 59 | Admitting: Family Medicine

## 2020-08-24 ENCOUNTER — Encounter: Payer: Self-pay | Admitting: Family Medicine

## 2020-08-24 DIAGNOSIS — B009 Herpesviral infection, unspecified: Secondary | ICD-10-CM | POA: Diagnosis not present

## 2020-08-24 DIAGNOSIS — B0089 Other herpesviral infection: Secondary | ICD-10-CM

## 2020-08-24 DIAGNOSIS — C659 Malignant neoplasm of unspecified renal pelvis: Secondary | ICD-10-CM | POA: Diagnosis not present

## 2020-08-24 DIAGNOSIS — E782 Mixed hyperlipidemia: Secondary | ICD-10-CM | POA: Diagnosis not present

## 2020-08-24 DIAGNOSIS — I10 Essential (primary) hypertension: Secondary | ICD-10-CM | POA: Diagnosis not present

## 2020-08-24 MED ORDER — VALACYCLOVIR HCL 500 MG PO TABS: ORAL_TABLET | ORAL | 1 refills | Status: DC

## 2020-08-24 NOTE — Patient Instructions (Signed)
Great to see you today! Please follow up in 6 months or sooner if needed.

## 2020-08-24 NOTE — Assessment & Plan Note (Signed)
BP remains well controlled without medication.

## 2020-08-24 NOTE — Assessment & Plan Note (Signed)
She has been released by her oncologist in [place] as she is >15 years out from treatment. She will let me know if she develops new symptoms.

## 2020-08-24 NOTE — Assessment & Plan Note (Signed)
Well controlled with valtrex as needed, rx renewed.

## 2020-08-24 NOTE — Progress Notes (Signed)
Jessica Ritter - 59 y.o. female MRN [id number]  Date of birth: 1960/11/30  Subjective Chief Complaint  Patient presents with  . Follow-up    HPI  Jessica Ritter is a 59 y.o. female with extensive past medical history including metastatic urothelial cancer, fall resulting in  subdural hematoma and CVA here today for follow up.  She is accompanied by her husband today.  She reports that she is doing well.   She has been released by oncologist in [place].  She are still awaiting a hearing for disability but there is back log due to [place].   She needs refill of valtrex, using as needed for HSV.    ROS:  A comprehensive ROS was completed and negative except as noted per HPI  Allergies  Allergen Reactions  . Anticoagulant Compound Other (See Comments)    Subdural Hematoma  . Wasp Venom Protein Swelling  . Bee Venom Swelling  . Warfarin Other (See Comments)    Amyloid angiopathy.  Hih risk of fatal ICH on treatment dose anticoagulants.  Antiplatelet therapy and DVT prophylaxis is okay.  . Ciprofloxacin Nausea And Vomiting    Vomiting     Past Medical History:  Diagnosis Date  . Cancer [facility])    metastatic bladder  . CVA (cerebral vascular accident) ([place]) 03/2015   left hemiparesis  . Diastolic dysfunction 2/75/1700   Grade 1  . 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 Gait disturbance, post-stroke 04/03/2018  . History of lower GI bleeding 05/2018  . History of malignant neoplasm metastatic to lung 2002  . History of malignant neoplasm of brain 2003   secondary, renal  . History of malignant neoplasm of renal pelvis 1998  . History of multiple pulmonary nodules 2002  . Hyperlipidemia   . Hypertension   . Seizures ([place]) 2016   tapered off of Keppra 06/2018  . Sleep apnea   . Subdural hematoma caused by concussion ([place]) 04/2014    Past Surgical History:  Procedure Laterality Date  . BRAIN SURGERY    . brain tumor surgery    .  COLONOSCOPY W/ POLYPECTOMY  05/2018  . KIDNEY SURGERY    . LEG SURGERY    . LUNG LOBECTOMY Right 2002   RLL    Social History   Socioeconomic History  . Marital status: Married    Spouse name: Not on file  . Number of children: Not on file  . Years of education: Not on file  . Highest education level: Not on file  Occupational History  . Not on file  Tobacco Use  . Smoking status: Former Smoker    Types: Cigarettes    Quit date: 07/21/1997    Years since quitting: 23.1  . Smokeless tobacco: Never Used  Substance and Sexual Activity  . Alcohol use: Yes    Alcohol/week: 4.0 standard drinks    Types: 2 Glasses of wine, 2 Standard drinks or equivalent per week  . Drug use: No  . Sexual activity: Not Currently    Partners: Male  Other Topics Concern  . Not on file  Social History Narrative  . Not on file   Social Determinants of Health   Financial Resource Strain:   . Difficulty of Paying Living Expenses: Not on file  Food Insecurity:   . Worried About Charity fundraiser in the Last Year: Not on file  . Ran Out of Food in the Last Year: Not on file  Transportation Needs:   .  Lack of Transportation (Medical): Not on file  . Lack of Transportation (Non-Medical): Not on file  Physical Activity:   . Days of Exercise per Week: Not on file  . Minutes of Exercise per Session: Not on file  Stress:   . Feeling of Stress : Not on file  Social Connections:   . Frequency of Communication with Friends and Family: Not on file  . Frequency of Social Gatherings with Friends and Family: Not on file  . Attends Religious Services: Not on file  . Active Member of Clubs or Organizations: Not on file  . Attends Archivist Meetings: Not on file  . Marital Status: Not on file    Family History  Problem Relation Age of Onset  . Cancer Other        mother     Health Maintenance  Topic Date Due  . Hepatitis C Screening  Never done  . INFLUENZA VACCINE  01/18/2021  (Originally 05/21/2020)  . MAMMOGRAM  07/27/2021  . PAP SMEAR-Modifier  10/01/2022  . COLONOSCOPY  06/19/2023  . TETANUS/TDAP  07/03/2027  . COVID-19 Vaccine  Completed  . HIV Screening  Discontinued     ----------------------------------------------------------------------------------------------------------------------------------------------------------------------------------------------------------------- Physical Exam BP 130/85 (BP Location: Left Arm, Patient Position: Sitting, Cuff Size: Small)   Pulse 92   Wt 144 lb (65.3 kg)   SpO2 97%   BMI 23.14 kg/m   Physical Exam Constitutional:      Appearance: Normal appearance.  HENT:     Head: Normocephalic and atraumatic.  Eyes:     General: No scleral icterus. Cardiovascular:     Rate and Rhythm: Normal rate and regular rhythm.  Pulmonary:     Effort: Pulmonary effort is normal.     Breath sounds: Normal breath sounds.  Skin:    General: Skin is warm and dry.  Neurological:     Mental Status: She is alert.  Psychiatric:        Mood and Affect: Mood normal.        Behavior: Behavior normal.     ------------------------------------------------------------------------------------------------------------------------------------------------------------------------------------------------------------------- Assessment and Plan  Hypertension goal BP (blood pressure) < 130/80 BP remains well controlled without medication.    Malignant neoplasm of renal pelvis [facility]) She has been released by her oncologist in [place] as she is >15 years out from treatment. She will let me know if she develops new symptoms.   Recurrent herpes simplex virus (HSV) infection of buttock Well controlled with valtrex as needed, rx renewed.   Hyperlipidemia Tolerating atorvastatin well, continue.    Meds ordered this encounter  Medications  . valACYclovir (VALTREX) 500 MG tablet    Sig: TAKE 1 TABLET BY MOUTH TWICE DAILY FOR 3 DAYS AS  NEEDED FOR  OUTBREAKS    Dispense:  30 tablet    Refill:  1    Return in about 6 months (around 02/21/2021) for HLD.    This visit occurred during the SARS-CoV-2 public health emergency.  Safety protocols were in place, including screening questions prior to the visit, additional usage of staff PPE, and extensive cleaning of exam room while observing appropriate contact time as indicated for disinfecting solutions.

## 2020-08-24 NOTE — Assessment & Plan Note (Signed)
Tolerating atorvastatin well, continue.

## 2020-08-25 LAB — LIPID PANEL
Cholesterol: 148 mg/dL (ref <200)
HDL: 43 mg/dL — ABNORMAL LOW (ref >=50)
LDL Cholesterol (Calc): 79 mg/dL (calc)
Non-HDL Cholesterol (Calc): 105 mg/dL (calc) (ref <130)
Total CHOL/HDL Ratio: 3.4 (calc) (ref <5.0)
Triglycerides: 159 mg/dL — ABNORMAL HIGH (ref <150)

## 2020-08-25 LAB — CBC
HCT: 40.8 % (ref 35.0–45.0)
Hemoglobin: 13.9 g/dL (ref 11.7–15.5)
MCH: 32 pg (ref 27.0–33.0)
MCHC: 34.1 g/dL (ref 32.0–36.0)
MCV: 93.8 fL (ref 80.0–100.0)
MPV: 9.8 fL (ref 7.5–12.5)
Platelets: 236 10*3/uL (ref 140–400)
RBC: 4.35 10*6/uL (ref 3.80–5.10)
RDW: 12.6 % (ref 11.0–15.0)
WBC: 9 10*3/uL (ref 3.8–10.8)

## 2020-08-25 LAB — COMPLETE METABOLIC PANEL WITH GFR
AG Ratio: 2 (calc) (ref 1.0–2.5)
ALT: 20 U/L (ref 6–29)
AST: 20 U/L (ref 10–35)
Albumin: 4.6 g/dL (ref 3.6–5.1)
Alkaline phosphatase (APISO): 77 U/L (ref 37–153)
BUN: 22 mg/dL (ref 7–25)
CO2: 24 mmol/L (ref 20–32)
Calcium: 10.3 mg/dL (ref 8.6–10.4)
Chloride: 104 mmol/L (ref 98–110)
Creat: 0.85 mg/dL (ref 0.50–1.05)
GFR, Est African American: 88 mL/min/{1.73_m2} (ref >=60)
GFR, Est Non African American: 76 mL/min/{1.73_m2} (ref >=60)
Globulin: 2.3 g/dL (calc) (ref 1.9–3.7)
Glucose, Bld: 80 mg/dL (ref 65–139)
Potassium: 4.3 mmol/L (ref 3.5–5.3)
Sodium: 142 mmol/L (ref 135–146)
Total Bilirubin: 1.5 mg/dL — ABNORMAL HIGH (ref 0.2–1.2)
Total Protein: 6.9 g/dL (ref 6.1–8.1)

## 2020-08-25 LAB — HEMOGLOBIN A1C
Hgb A1c MFr Bld: 5.2 % of total Hgb (ref <5.7)
Mean Plasma Glucose: 103 (calc)
eAG (mmol/L): 5.7 (calc)

## 2020-10-05 IMAGING — MG MM DIGITAL SCREENING BILAT W/ TOMO W/ CAD
8 series · 8 of 24 positions shown · non-contrast
Comparison: Previous exam(s).

CLINICAL DATA: Screening.

EXAM:
DIGITAL SCREENING BILATERAL MAMMOGRAM WITH TOMO AND CAD

[R MLO synth-2D]
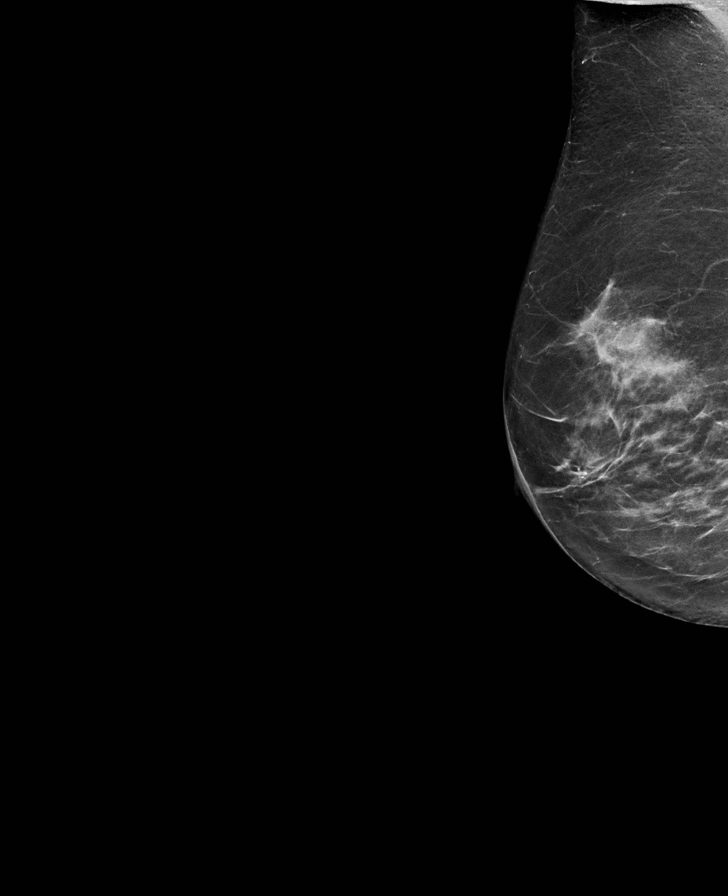

[L CC synth-2D]
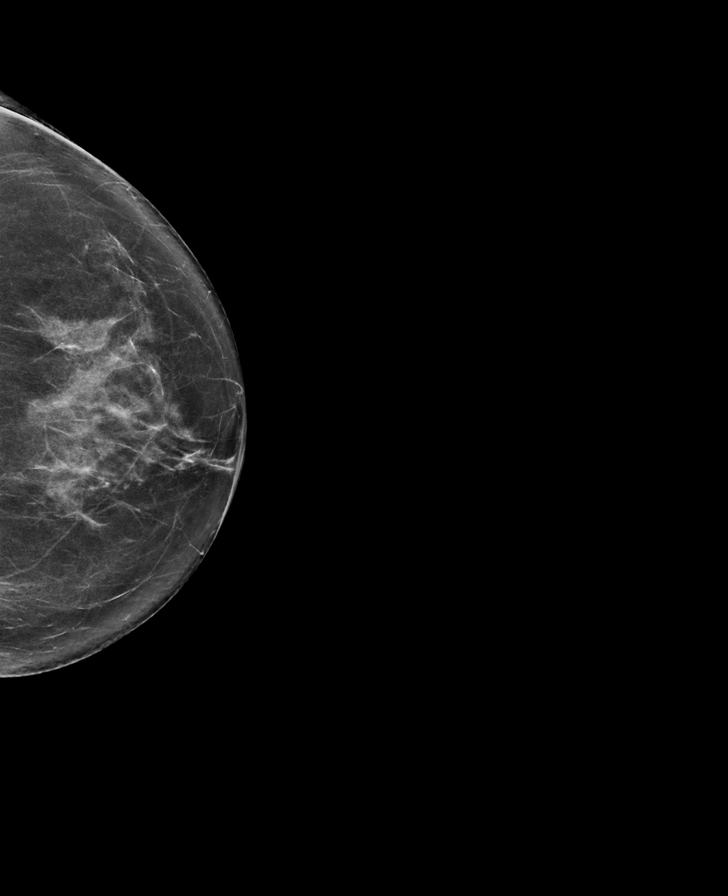

[L MLO synth-2D]
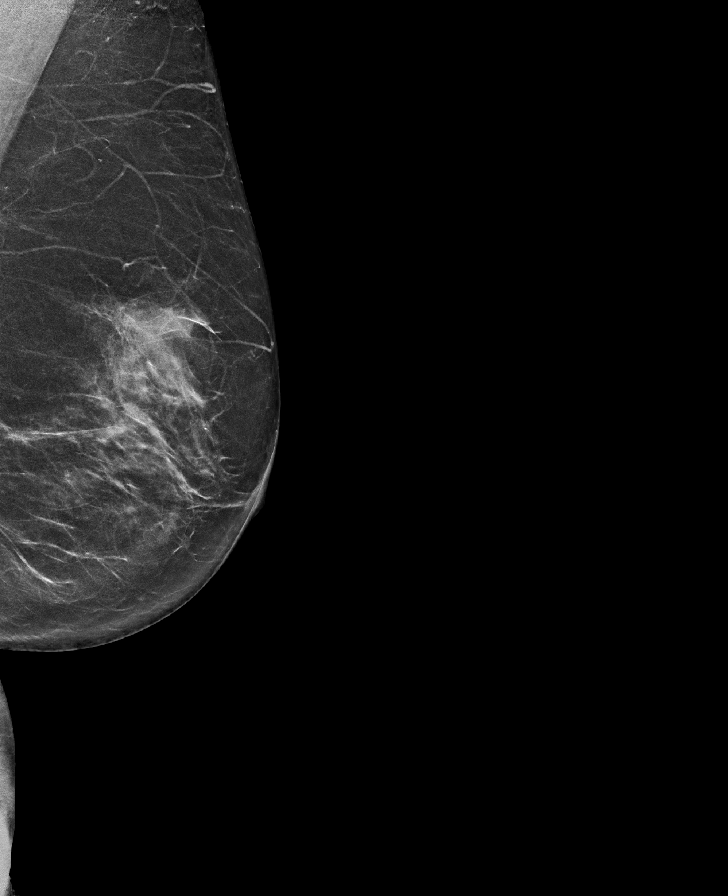

[R CC synth-2D]
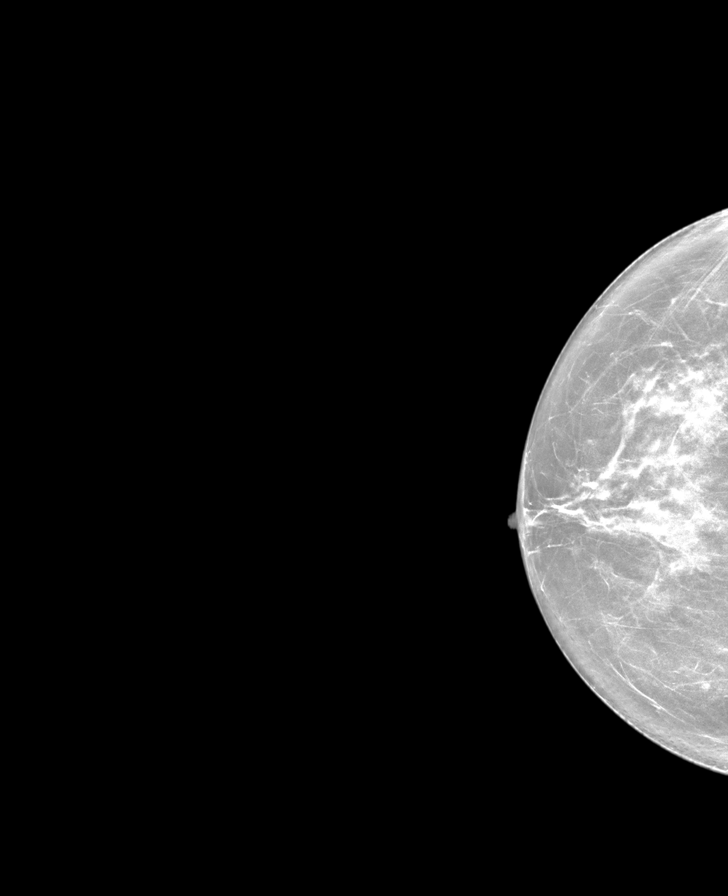

[R MLO tomo · tomo slice 35/68.0]
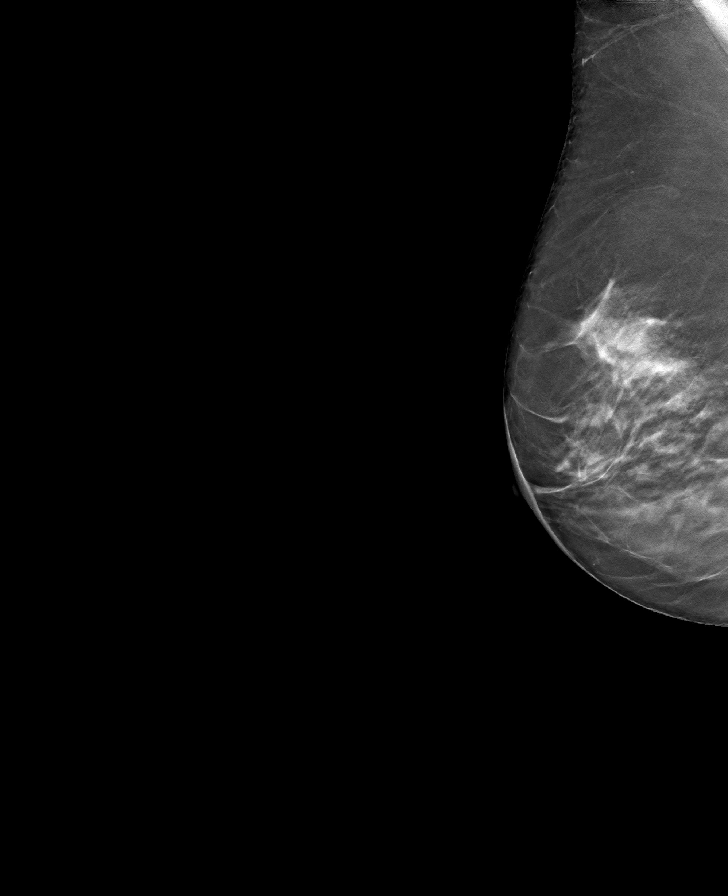

[R CC tomo · tomo slice 33/65.0]
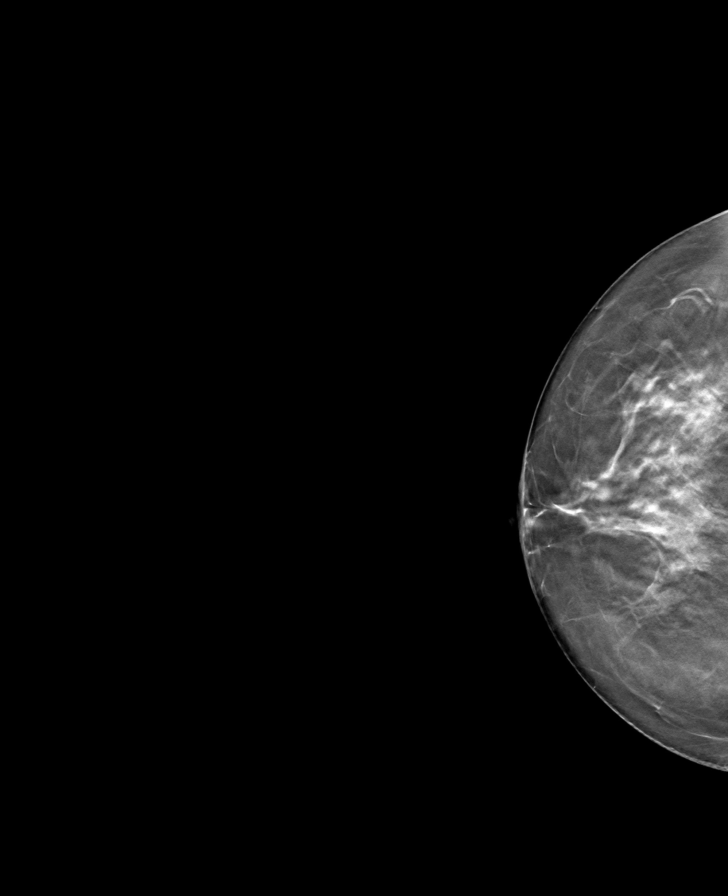

[L MLO tomo · tomo slice 34/67.0]
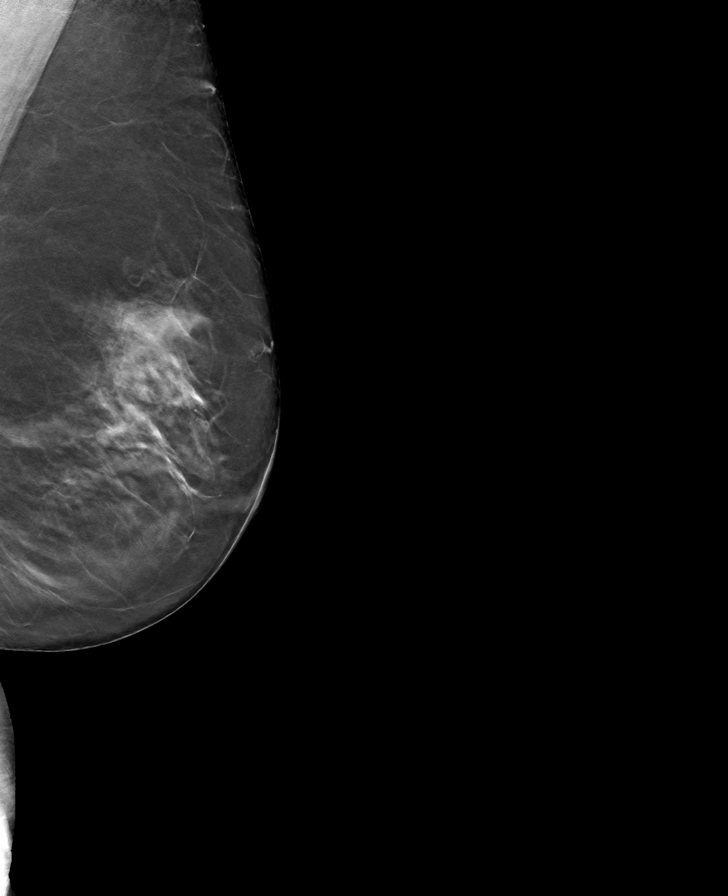

[L CC tomo · tomo slice 36/71.0]
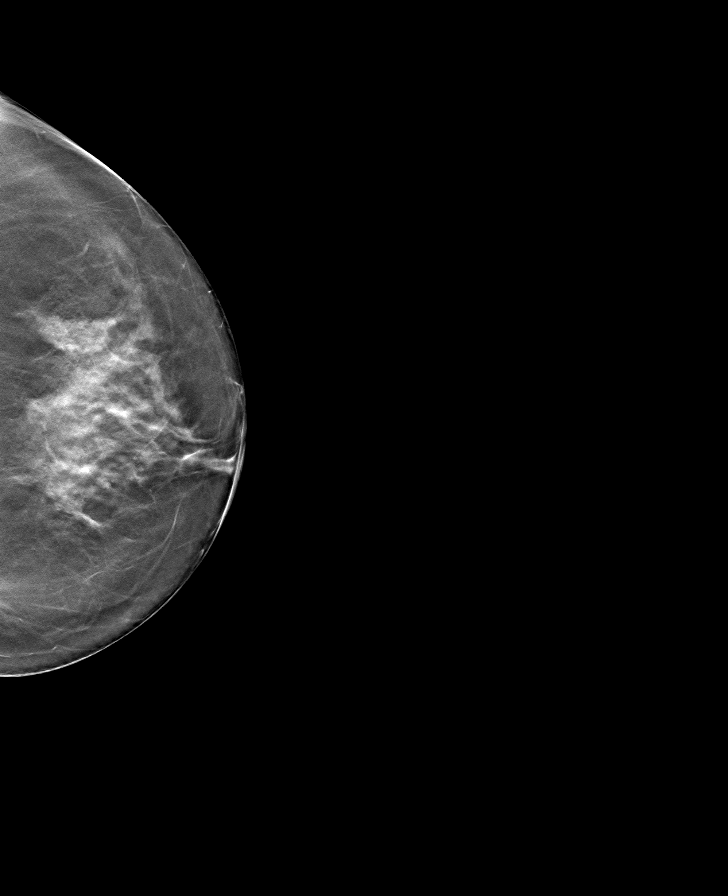

[8 of 24 positions shown; findings below may reference images not displayed]

ACR Breast Density Category b: There are scattered areas of
fibroglandular density.
FINDINGS: There are no findings suspicious for malignancy. Images were
processed with CAD.
IMPRESSION: No mammographic evidence of malignancy. A result letter of this
screening mammogram will be mailed directly to the patient.

RECOMMENDATION:
Screening mammogram in one year. (Code:[id number])

BI-RADS CATEGORY  1: Negative.

## 2021-03-01 ENCOUNTER — Encounter: Payer: Self-pay | Admitting: Family Medicine

## 2021-03-01 ENCOUNTER — Other Ambulatory Visit: Payer: Self-pay | Admitting: Family Medicine

## 2021-03-01 DIAGNOSIS — E782 Mixed hyperlipidemia: Secondary | ICD-10-CM

## 2021-03-01 NOTE — Telephone Encounter (Signed)
No additional labs needed for upcoming visit.

## 2021-03-02 ENCOUNTER — Other Ambulatory Visit: Payer: Self-pay

## 2021-03-02 ENCOUNTER — Encounter: Payer: Self-pay | Admitting: Family Medicine

## 2021-03-02 ENCOUNTER — Ambulatory Visit (INDEPENDENT_AMBULATORY_CARE_PROVIDER_SITE_OTHER): Payer: BC Managed Care – PPO | Admitting: Family Medicine

## 2021-03-02 VITALS — BP 107/73 | HR 90 | Wt 160.0 lb

## 2021-03-02 DIAGNOSIS — I6930 Unspecified sequelae of cerebral infarction: Secondary | ICD-10-CM

## 2021-03-02 DIAGNOSIS — I10 Essential (primary) hypertension: Secondary | ICD-10-CM | POA: Diagnosis not present

## 2021-03-02 DIAGNOSIS — B009 Herpesviral infection, unspecified: Secondary | ICD-10-CM

## 2021-03-02 DIAGNOSIS — B0089 Other herpesviral infection: Secondary | ICD-10-CM

## 2021-03-02 DIAGNOSIS — E782 Mixed hyperlipidemia: Secondary | ICD-10-CM

## 2021-03-02 DIAGNOSIS — C651 Malignant neoplasm of right renal pelvis: Secondary | ICD-10-CM

## 2021-03-02 MED ORDER — ATORVASTATIN CALCIUM 80 MG PO TABS: ORAL_TABLET | ORAL | 3 refills | Status: DC

## 2021-03-02 MED ORDER — VALACYCLOVIR HCL 500 MG PO TABS: ORAL_TABLET | ORAL | 1 refills | Status: DC

## 2021-03-02 NOTE — Assessment & Plan Note (Addendum)
Followed by neurology. Stable at this time.  Continue statin.  Lab Results  Component Value Date   LDLCALC 79 08/24/2020

## 2021-03-02 NOTE — Assessment & Plan Note (Signed)
In remission.  She was recently released by oncology.  Will continue to monitor clinicially.

## 2021-03-02 NOTE — Patient Instructions (Signed)
Great to see you today! Continue current medications.  See me again in 6 months.  

## 2021-03-02 NOTE — Assessment & Plan Note (Signed)
BP remains well controlled without medication.  Continue to monitor.

## 2021-03-02 NOTE — Assessment & Plan Note (Signed)
Tolerating atorvastatin well.  Continue at current strength.  Recheck lipids at next visit.

## 2021-03-02 NOTE — Assessment & Plan Note (Signed)
Valtrex as needed renewed.

## 2021-03-02 NOTE — Progress Notes (Signed)
Jessica Ritter - 60 y.o. female MRN [id number]  Date of birth: 11/12/1960  Subjective Chief Complaint  Patient presents with  . Follow-up    HPI Jessica Ritter is a 60 y.o. female here today for follow up visit.  She has extensive history listed below.  She continues to do well with atorvastatin at current strength.  Last ldl was 79.  She was recently released by her oncologist.  She continues to see neurology.    She takes valtrex as needed for HSV outbreak.    ROS:  A comprehensive ROS was completed and negative except as noted per HPI  Allergies  Allergen Reactions  . Anticoagulant Compound Other (See Comments)    Subdural Hematoma  . Wasp Venom Protein Swelling  . Bee Venom Swelling  . Warfarin Other (See Comments)    Amyloid angiopathy.  Hih risk of fatal ICH on treatment dose anticoagulants.  Antiplatelet therapy and DVT prophylaxis is okay.  . Ciprofloxacin Nausea And Vomiting    Vomiting     Past Medical History:  Diagnosis Date  . Cancer [facility])    metastatic bladder  . CVA (cerebral vascular accident) ([place]) 03/2015   left hemiparesis  . Diastolic dysfunction 04/19/5283   Grade 1  . 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 Gait disturbance, post-stroke 04/03/2018  . History of lower GI bleeding 05/2018  . History of malignant neoplasm metastatic to lung 2002  . History of malignant neoplasm of brain 2003   secondary, renal  . History of malignant neoplasm of renal pelvis 1998  . History of multiple pulmonary nodules 2002  . Hyperlipidemia   . Hypertension   . Seizures ([place]) 2016   tapered off of Keppra 06/2018  . Sleep apnea   . Subdural hematoma caused by concussion ([place]) 04/2014    Past Surgical History:  Procedure Laterality Date  . BRAIN SURGERY    . brain tumor surgery    . COLONOSCOPY W/ POLYPECTOMY  05/2018  . KIDNEY SURGERY    . LEG SURGERY    . LUNG LOBECTOMY Right 2002   RLL    Social History    Socioeconomic History  . Marital status: Married    Spouse name: Not on file  . Number of children: Not on file  . Years of education: Not on file  . Highest education level: Not on file  Occupational History  . Not on file  Tobacco Use  . Smoking status: Former Smoker    Types: Cigarettes    Quit date: 07/21/1997    Years since quitting: 23.6  . Smokeless tobacco: Never Used  Substance and Sexual Activity  . Alcohol use: Yes    Alcohol/week: 4.0 standard drinks    Types: 2 Glasses of wine, 2 Standard drinks or equivalent per week  . Drug use: No  . Sexual activity: Not Currently    Partners: Male  Other Topics Concern  . Not on file  Social History Narrative  . Not on file   Social Determinants of Health   Financial Resource Strain: Not on file  Food Insecurity: Not on file  Transportation Needs: Not on file  Physical Activity: Not on file  Stress: Not on file  Social Connections: Not on file    Family History  Problem Relation Age of Onset  . Cancer Other        mother     Health Maintenance  Topic Date Due  . Hepatitis C Screening  Never done  . COVID-19 Vaccine (3 - Pfizer risk 4-dose series) 02/28/2020  . INFLUENZA VACCINE  05/21/2021  . MAMMOGRAM  07/27/2021  . PAP SMEAR-Modifier  10/01/2022  . COLONOSCOPY (Pts 45-6yrs Insurance coverage will need to be confirmed)  06/19/2023  . TETANUS/TDAP  07/03/2027  . HPV VACCINES  Aged Out  . HIV Screening  Discontinued     ----------------------------------------------------------------------------------------------------------------------------------------------------------------------------------------------------------------- Physical Exam BP 107/73 (BP Location: Left Arm, Patient Position: Sitting, Cuff Size: Normal)   Pulse 90   Wt 160 lb (72.6 kg)   SpO2 96%   BMI 25.71 kg/m   Physical Exam Constitutional:      Appearance: Normal appearance.  HENT:     Head: Normocephalic and atraumatic.   Cardiovascular:     Rate and Rhythm: Normal rate and regular rhythm.  Pulmonary:     Effort: Pulmonary effort is normal.     Breath sounds: Normal breath sounds.  Musculoskeletal:     Cervical back: Neck supple.  Neurological:     General: No focal deficit present.     Mental Status: She is alert.  Psychiatric:        Mood and Affect: Mood normal.        Behavior: Behavior normal.     ------------------------------------------------------------------------------------------------------------------------------------------------------------------------------------------------------------------- Assessment and Plan  Hypertension goal BP (blood pressure) < 130/80 BP remains well controlled without medication.  Continue to monitor.    Malignant neoplasm of renal pelvis (HCC) In remission.  She was recently released by oncology.  Will continue to monitor clinicially.   History of CVA with residual deficit Followed by neurology. Stable at this time.  Continue statin.  Lab Results  Component Value Date   LDLCALC 79 08/24/2020     Hyperlipidemia Tolerating atorvastatin well.  Continue at current strength.  Recheck lipids at next visit.   Recurrent herpes simplex virus (HSV) infection of buttock Valtrex as needed renewed.    Meds ordered this encounter  Medications  . atorvastatin (LIPITOR) 80 MG tablet    Sig: TAKE 1 TABLET BY MOUTH ONCE DAILY AT  6  PM.    Dispense:  90 tablet    Refill:  3  . valACYclovir (VALTREX) 500 MG tablet    Sig: TAKE 1 TABLET BY MOUTH TWICE DAILY FOR 3 DAYS AS NEEDED FOR  OUTBREAKS    Dispense:  30 tablet    Refill:  1    Return in about 6 months (around 09/02/2021) for HLD/Labs.    This visit occurred during the SARS-CoV-2 public health emergency.  Safety protocols were in place, including screening questions prior to the visit, additional usage of staff PPE, and extensive cleaning of exam room while observing appropriate contact time as  indicated for disinfecting solutions.

## 2021-05-07 ENCOUNTER — Encounter: Payer: Self-pay | Admitting: Family Medicine

## 2021-08-27 ENCOUNTER — Encounter: Payer: Self-pay | Admitting: Family Medicine

## 2021-08-27 ENCOUNTER — Ambulatory Visit (INDEPENDENT_AMBULATORY_CARE_PROVIDER_SITE_OTHER): Payer: BC Managed Care – PPO | Admitting: Family Medicine

## 2021-08-27 VITALS — BP 124/74 | HR 79 | Ht 66.0 in | Wt 141.0 lb

## 2021-08-27 DIAGNOSIS — R269 Unspecified abnormalities of gait and mobility: Secondary | ICD-10-CM

## 2021-08-27 DIAGNOSIS — R7309 Other abnormal glucose: Secondary | ICD-10-CM

## 2021-08-27 DIAGNOSIS — I69398 Other sequelae of cerebral infarction: Secondary | ICD-10-CM

## 2021-08-27 DIAGNOSIS — E782 Mixed hyperlipidemia: Secondary | ICD-10-CM | POA: Diagnosis not present

## 2021-08-27 DIAGNOSIS — I10 Essential (primary) hypertension: Secondary | ICD-10-CM

## 2021-08-27 DIAGNOSIS — H6123 Impacted cerumen, bilateral: Secondary | ICD-10-CM

## 2021-08-27 NOTE — Patient Instructions (Signed)
Great to see you today! Have labs completed when fasting.  See me again in 6 months or sooner if needed.

## 2021-08-27 NOTE — Assessment & Plan Note (Signed)
Blood pressure remains well controlled at this time.  Low-sodium diet encouraged.

## 2021-08-27 NOTE — Assessment & Plan Note (Addendum)
Bilateral cerumen impaction.  Lavage attempted today however unable to remove wax.  They will try Debrox over the next couple weeks.  If this persists we discussed returning to reattempt lavage and curettes in your referral to ENT.

## 2021-08-27 NOTE — Assessment & Plan Note (Signed)
She continues to have significant gait disturbance.  Recent fall with small hematoma along the posterior scalp.  No new neurological changes.

## 2021-08-27 NOTE — Progress Notes (Signed)
[address]la[place] Court - 60 y.o. female MRN [id number]  Date of birth: 07-13-1961  Subjective Chief Complaint  Patient presents with   Fall    HPI Jessica Ritter is a 60 year old female here today for follow-up visit.  She has extensive past medical history including multiple malignancies, CVA and TBI.  Which has been fairly stable.  She has been released by her oncologist earlier this year as her cancer has been in remission.  She did have recent fall hitting her head on a nightstand.  There is a small hematoma along the parietal occipital area on the left side.  She denies any new neurological changes, headaches or vision changes.  She continues to do well with current medications.  She is tolerating atorvastatin well without side effects.  She is due for updated labs.  She has noted some fullness to ears, feels like wax buildup.  She denies any pain associated with this.  ROS:  A comprehensive ROS was completed and negative except as noted per HPI  Allergies  Allergen Reactions   Anticoagulant Compound Other (See Comments)    Subdural Hematoma   Wasp Venom Protein Swelling   Bee Venom Swelling   Warfarin Other (See Comments)    Amyloid angiopathy.  Hih risk of fatal ICH on treatment dose anticoagulants.  Antiplatelet therapy and DVT prophylaxis is okay.   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) 07/02/2017   Gait disturbance, post-stroke 04/03/2018   History of lower GI bleeding 05/2018   History of malignant neoplasm metastatic to lung 2002   History of malignant neoplasm of brain 2003   secondary, renal   History of malignant neoplasm of renal pelvis 1998   History of multiple pulmonary nodules 2002   Hyperlipidemia     Hypertension    Seizures ([place]) 2016   tapered off of Keppra 06/2018   Sleep apnea    Subdural hematoma caused by concussion 04/2014    Past Surgical History:  Procedure Laterality Date   BRAIN SURGERY     brain tumor surgery     COLONOSCOPY W/ POLYPECTOMY  05/2018   KIDNEY SURGERY     LEG SURGERY     LUNG LOBECTOMY Right 2002   RLL    Social History   Socioeconomic History   Marital status: Married    Spouse name: Not on file   Number of children: Not on file   Years of education: Not on file   Highest education level: Not on file  Occupational History   Not on file  Tobacco Use   Smoking status: Former    Types: Cigarettes    Quit date: 07/21/1997    Years since quitting: 24.1   Smokeless tobacco: Never  Substance and Sexual Activity   Alcohol use: Yes    Alcohol/week: 4.0 standard drinks    Types: 2 Glasses of wine, 2 Standard drinks or equivalent per week   Drug use: No   Sexual activity: Not Currently    Partners: Male  Other Topics Concern   Not on file  Social History Narrative   Not on file   Social Determinants of Health   Financial Resource Strain: Not on file  Food Insecurity:  Not on file  Transportation Needs: Not on file  Physical Activity: Not on file  Stress: Not on file  Social Connections: Not on file    Family History  Problem Relation Age of Onset   Cancer Other        mother     Health Maintenance  Topic Date Due   Hepatitis C Screening  Never done   MAMMOGRAM  07/27/2021   COVID-19 Vaccine (3 - Pfizer risk series) 09/12/2021 (Originally 02/28/2020)   Zoster Vaccines- Shingrix (1 of 2) 11/27/2021 (Originally 09/30/1981)   INFLUENZA VACCINE  01/18/2022 (Originally 05/21/2021)   Pneumococcal Vaccine 45-57 Years old (1 - PCV) 08/27/2022 (Originally 09/30/1968)   PAP SMEAR-Modifier  10/01/2022   COLONOSCOPY (Pts 45-52yrs Insurance coverage will need to be confirmed)  06/19/2023   TETANUS/TDAP  07/03/2027   HPV VACCINES  Aged Out    HIV Screening  Discontinued     ----------------------------------------------------------------------------------------------------------------------------------------------------------------------------------------------------------------- Physical Exam BP 124/74 (BP Location: Left Arm, Patient Position: Sitting, Cuff Size: Normal)   Pulse 79   Ht 5\' 6"  (1.676 m)   Wt 141 lb (64 kg)   SpO2 95%   BMI 22.76 kg/m   Physical Exam Constitutional:      Appearance: Normal appearance.  HENT:     Head:      Comments: Small hematoma, non-tender.  Controlled  Eyes:     General: No scleral icterus. Cardiovascular:     Rate and Rhythm: Normal rate and regular rhythm.  Pulmonary:     Effort: Pulmonary effort is normal.     Breath sounds: Normal breath sounds.  Musculoskeletal:     Cervical back: Neck supple.  Neurological:     Mental Status: She is alert.  Psychiatric:        Mood and Affect: Mood normal.        Behavior: Behavior normal.    ------------------------------------------------------------------------------------------------------------------------------------------------------------------------------------------------------------------- Assessment and Plan  Hypertension goal BP (blood pressure) < 130/80 Blood pressure remains well controlled at this time.  Low-sodium diet encouraged.  Bilateral hearing loss due to cerumen impaction Bilateral cerumen impaction.  Lavage attempted today however unable to remove wax.  They will try Debrox over the next couple weeks.  If this persists we discussed returning to reattempt lavage and curettes in your referral to ENT.  Gait disturbance, post-stroke She continues to have significant gait disturbance.  Recent fall with small hematoma along the posterior scalp.  No new neurological changes.   No orders of the defined types were placed in this encounter.   Return in about 6 months (around 02/24/2022) for F/u  HTN/HLD.    This visit occurred during the SARS-CoV-2 public health emergency.  Safety protocols were in place, including screening questions prior to the visit, additional usage of staff PPE, and extensive cleaning of exam room while observing appropriate contact time as indicated for disinfecting solutions.

## 2021-08-31 ENCOUNTER — Other Ambulatory Visit: Payer: BC Managed Care – PPO

## 2021-08-31 ENCOUNTER — Ambulatory Visit: Payer: Self-pay | Admitting: Family Medicine

## 2021-08-31 DIAGNOSIS — E782 Mixed hyperlipidemia: Secondary | ICD-10-CM | POA: Diagnosis not present

## 2021-08-31 DIAGNOSIS — I10 Essential (primary) hypertension: Secondary | ICD-10-CM | POA: Diagnosis not present

## 2021-08-31 DIAGNOSIS — R7309 Other abnormal glucose: Secondary | ICD-10-CM | POA: Diagnosis not present

## 2021-09-01 LAB — COMPLETE METABOLIC PANEL WITH GFR
AG Ratio: 1.8 (calc) (ref 1.0–2.5)
ALT: 20 U/L (ref 6–29)
AST: 19 U/L (ref 10–35)
Albumin: 4.4 g/dL (ref 3.6–5.1)
Alkaline phosphatase (APISO): 88 U/L (ref 37–153)
BUN/Creatinine Ratio: 18 (calc) (ref 6–22)
BUN: 19 mg/dL (ref 7–25)
CO2: 26 mmol/L (ref 20–32)
Calcium: 10.4 mg/dL (ref 8.6–10.4)
Chloride: 108 mmol/L (ref 98–110)
Creat: 1.06 mg/dL — ABNORMAL HIGH (ref 0.50–1.03)
Globulin: 2.4 g/dL (calc) (ref 1.9–3.7)
Glucose, Bld: 82 mg/dL (ref 65–99)
Potassium: 4.2 mmol/L (ref 3.5–5.3)
Sodium: 144 mmol/L (ref 135–146)
Total Bilirubin: 1.4 mg/dL — ABNORMAL HIGH (ref 0.2–1.2)
Total Protein: 6.8 g/dL (ref 6.1–8.1)
eGFR: 61 mL/min/{1.73_m2} (ref >=60)

## 2021-09-01 LAB — CBC WITH DIFFERENTIAL/PLATELET
Absolute Monocytes: 433 cells/uL (ref 200–950)
Basophils Absolute: 78 cells/uL (ref 0–200)
Basophils Relative: 1.1 %
Eosinophils Absolute: 440 cells/uL (ref 15–500)
Eosinophils Relative: 6.2 %
HCT: 41.3 % (ref 35.0–45.0)
Hemoglobin: 13.6 g/dL (ref 11.7–15.5)
Lymphs Abs: 2002 cells/uL (ref 850–3900)
MCH: 30.6 pg (ref 27.0–33.0)
MCHC: 32.9 g/dL (ref 32.0–36.0)
MCV: 92.8 fL (ref 80.0–100.0)
MPV: 9.7 fL (ref 7.5–12.5)
Monocytes Relative: 6.1 %
Neutro Abs: 4146 cells/uL (ref 1500–7800)
Neutrophils Relative %: 58.4 %
Platelets: 200 10*3/uL (ref 140–400)
RBC: 4.45 10*6/uL (ref 3.80–5.10)
RDW: 12.5 % (ref 11.0–15.0)
Total Lymphocyte: 28.2 %
WBC: 7.1 10*3/uL (ref 3.8–10.8)

## 2021-09-01 LAB — LIPID PANEL W/REFLEX DIRECT LDL
Cholesterol: 140 mg/dL (ref <200)
HDL: 46 mg/dL — ABNORMAL LOW (ref >=50)
LDL Cholesterol (Calc): 70 mg/dL (calc)
Non-HDL Cholesterol (Calc): 94 mg/dL (calc) (ref <130)
Total CHOL/HDL Ratio: 3 (calc) (ref <5.0)
Triglycerides: 153 mg/dL — ABNORMAL HIGH (ref <150)

## 2021-09-01 LAB — HEMOGLOBIN A1C
Hgb A1c MFr Bld: 5.4 % of total Hgb (ref <5.7)
Mean Plasma Glucose: 108 mg/dL
eAG (mmol/L): 6 mmol/L

## 2022-01-30 ENCOUNTER — Telehealth: Payer: Self-pay | Admitting: Emergency Medicine

## 2022-01-30 NOTE — Telephone Encounter (Signed)
Call back to pt's husband to let him know that we can see [facility] in the morning. Provider requested pt come in when we open in the morning. Message left ?

## 2022-01-31 ENCOUNTER — Emergency Department (INDEPENDENT_AMBULATORY_CARE_PROVIDER_SITE_OTHER)
Admission: EM | Admit: 2022-01-31 | Discharge: 2022-01-31 | Disposition: A | Discharge status: Home / Self Care | Payer: BC Managed Care – PPO | Source: Home / Self Care

## 2022-01-31 ENCOUNTER — Encounter: Payer: Self-pay | Admitting: Emergency Medicine

## 2022-01-31 DIAGNOSIS — L0591 Pilonidal cyst without abscess: Secondary | ICD-10-CM | POA: Diagnosis not present

## 2022-01-31 MED ORDER — SULFAMETHOXAZOLE-TRIMETHOPRIM 800-160 MG PO TABS: 1.0000 | ORAL_TABLET | Freq: Two times a day (BID) | ORAL | 0 refills | Status: AC

## 2022-01-31 NOTE — ED Provider Notes (Signed)
?[facility] ? ? ? ?CSN: [id number] ?Arrival date & time: 01/31/22  0801 ? ? ?  ? ?History   ?Chief Complaint ?Chief Complaint  ?Patient presents with  ? Cyst  ? ? ?HPI ?Jessica Ritter is a 61 y.o. female.  ? ?HPI 61 year old female presents with sebaceous cyst under right armpit for 1 week.  Reports previous sebaceous cyst in same location 5 years ago reports this cyst was I/D at that time.  PMH significant for CVA, secondary malignant neoplasm of lung, brain, spinal cord, and grade 1 diastolic dysfunction. ? ?Past Medical History:  ?Diagnosis Date  ? Cancer [facility])   ? metastatic bladder  ? CVA (cerebral vascular accident) ([place]) 03/2015  ? left hemiparesis  ? Diastolic dysfunction 5/63/1497  ? Grade 1  ? 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 Gait disturbance, post-stroke 04/03/2018  ? History of lower GI bleeding 05/2018  ? History of malignant neoplasm metastatic to lung 2002  ? History of malignant neoplasm of brain 2003  ? secondary, renal  ? History of malignant neoplasm of renal pelvis 1998  ? History of multiple pulmonary nodules 2002  ? Hyperlipidemia   ? Hypertension   ? Seizures ([place]) 2016  ? tapered off of Keppra 06/2018  ? Sleep apnea   ? Subdural hematoma caused by concussion ([place]) 04/2014  ? ? ?Patient Active Problem List  ? Diagnosis Date Noted  ? History of seizure 04/05/2019  ? History of CVA with residual deficit 04/05/2019  ? History of lower GI bleeding 04/05/2019  ? Gait disturbance, post-stroke 04/03/2018  ? Medication monitoring encounter 04/03/2018  ? Hypertension goal BP (blood pressure) < 130/80 04/03/2018  ? At high risk for falls 04/03/2018  ? Post-menopausal atrophic vaginitis 10/01/2017  ? Overweight (BMI 25.0-29.9) 07/02/2017  ? Grade I diastolic dysfunction 02/63/7858  ? 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 Tinnitus of both ears 07/02/2017  ? Bilateral hearing loss due to cerumen  impaction 07/02/2017  ? Recurrent herpes simplex virus (HSV) infection of buttock 11/27/2016  ? History of bladder cancer 11/25/2016  ? GERD (gastroesophageal reflux disease) 05/14/2016  ? Left leg weakness 05/14/2016  ? Osteopenia 05/09/2015  ? Cerebral amyloid angiopathy ([place]) 04/21/2015  ? History of subdural hematoma 04/21/2015  ? History of sleep apnea 04/21/2015  ? Hyperlipidemia 04/21/2015  ? Secondary malignant neoplasm of lung ([place]) 11/06/2012  ? 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 Malignant neoplasm of renal pelvis ([place]) 11/06/2012  ? ? ?Past Surgical History:  ?Procedure Laterality Date  ? BRAIN SURGERY    ? brain tumor surgery    ? COLONOSCOPY W/ POLYPECTOMY  05/2018  ? KIDNEY SURGERY    ? LEG SURGERY    ? LUNG LOBECTOMY Right 2002  ? RLL  ? ? ?OB History   ?No obstetric history on file. ?  ? ? ? ?Home Medications   ? ?Prior to Admission medications   ?Medication Sig Start Date End Date Taking? Authorizing Provider  ?aspirin EC 81 MG tablet Take 81 mg by mouth daily.   Yes [provider]  ?atorvastatin (LIPITOR) 80 MG tablet TAKE 1 TABLET BY MOUTH ONCE DAILY AT  6  PM. 03/02/21  Yes Luetta Nutting, DO  ?Calcium Carbonate-Vitamin D 600-400 MG-UNIT tablet Take 1 tablet by mouth 2 (two) times daily. 04/05/19  Yes Trixie Dredge, PA-C  ?sulfamethoxazole-trimethoprim (BACTRIM DS) 800-160 MG tablet Take  1 tablet by mouth 2 (two) times daily for 10 days. 01/31/22 02/10/22 Yes Eliezer Lofts, FNP  ?valACYclovir (VALTREX) 500 MG tablet TAKE 1 TABLET BY MOUTH TWICE DAILY FOR 3 DAYS AS NEEDED FOR  OUTBREAKS 03/02/21  Yes Luetta Nutting, DO  ? ? ?Family History ?Family History  ?Problem Relation Age of Onset  ? Cancer Other   ?     mother   ? ? ?Social History ?Social History  ? ?Tobacco Use  ? Smoking status: Former  ?  Types: Cigarettes  ?  Quit date: 07/21/1997  ?  Years since quitting: 24.5  ? Smokeless tobacco: Never  ?Substance Use Topics  ? Alcohol use:  Yes  ?  Alcohol/week: 4.0 standard drinks  ?  Types: 2 Glasses of wine, 2 Standard drinks or equivalent per week  ? Drug use: No  ? ? ? ?Allergies   ?Anticoagulant compound, Wasp venom protein, Bee venom, Warfarin, and Ciprofloxacin ? ? ?Review of Systems ?Review of Systems  ?Skin:   ?     Sebaceous cyst of right axilla x1 week  ? ? ?Physical Exam ?Triage Vital Signs ?ED Triage Vitals  ?Enc Vitals Group  ?   BP 01/31/22 0819 112/77  ?   Pulse Rate 01/31/22 0819 90  ?   Resp 01/31/22 0819 18  ?   Temp 01/31/22 0819 98.6 ?F (37 ?C)  ?   Temp Source 01/31/22 0819 Oral  ?   SpO2 01/31/22 0819 95 %  ?   Weight --   ?   Height --   ?   Head Circumference --   ?   Peak Flow --   ?   Pain Score 01/31/22 0817 7  ?   Pain Loc --   ?   Pain Edu? --   ?   Excl. in [place]? --   ? ?No data found. ? ?Updated Vital Signs ?BP 112/77 (BP Location: Left Arm)   Pulse 90   Temp 98.6 ?F (37 ?C) (Oral)   Resp 18   SpO2 95%  ? ?Physical Exam ?Vitals and nursing note reviewed.  ?Constitutional:   ?   Appearance: Normal appearance. She is normal weight.  ?HENT:  ?   Head: Normocephalic and atraumatic.  ?   Mouth/Throat:  ?   Mouth: Mucous membranes are moist.  ?   Pharynx: Oropharynx is clear.  ?Eyes:  ?   Extraocular Movements: Extraocular movements intact.  ?   Conjunctiva/sclera: Conjunctivae normal.  ?   Pupils: Pupils are equal, round, and reactive to light.  ?Cardiovascular:  ?   Rate and Rhythm: Normal rate and regular rhythm.  ?   Pulses: Normal pulses.  ?   Heart sounds: Normal heart sounds.  ?Pulmonary:  ?   Effort: Pulmonary effort is normal.  ?   Breath sounds: Normal breath sounds. No wheezing, rhonchi or rales.  ?Musculoskeletal:  ?   Cervical back: Normal range of motion and neck supple.  ?Skin: ?   General: Skin is warm and dry.  ?   Comments: Right axilla: ~1.0 cm x 0.5 cm erythematous, indurated, mildly raised pilonidal cyst  ?Neurological:  ?   General: No focal deficit present.  ?   Mental Status: She is alert and  oriented to person, place, and time. Mental status is at baseline.  ?   Comments: Patient seated comfortably in wheelchair today.  ? ? ? ?UC Treatments / Results  ?Labs ?(all labs ordered are listed, but  only abnormal results are displayed) ?Labs Reviewed - No data to display ? ?EKG ? ? ?Radiology ?No results found. ? ?Procedures ?Procedures (including critical care time) ? ?Medications Ordered in UC ?Medications - No data to display ? ?Initial Impression / Assessment and Plan / UC Course  ?I have reviewed the triage vital signs and the nursing notes. ? ?Pertinent labs & imaging results that were available during my care of the patient were reviewed by me and considered in my medical decision making (see chart for details). ? ?  ? ?MDM: 1.  Pilonidal cyst-Rx'd Bactrim. Advised patient/husband take medication as directed with food to completion.  Encouraged patient to increase daily water intake while taking this medication.  Advised patient/husband if symptoms worsen and/or unresolved please follow-up with PCP or here for further evaluation.  Patient discharged home, hemodynamically stable. ?Final Clinical Impressions(s) / UC Diagnoses  ? ?Final diagnoses:  ?Pilonidal cyst  ? ? ? ?Discharge Instructions   ? ?  ?Advised patient/husband take medication as directed with food to completion.  Encouraged patient to increase daily water intake while taking this medication.  Advised patient/husband if symptoms worsen and/or unresolved please follow-up with PCP or here for further evaluation. ? ? ? ? ?ED Prescriptions   ? ? Medication Sig Dispense Auth. Provider  ? sulfamethoxazole-trimethoprim (BACTRIM DS) 800-160 MG tablet Take 1 tablet by mouth 2 (two) times daily for 10 days. 20 tablet Eliezer Lofts, FNP  ? ?  ? ?PDMP not reviewed this encounter. ?  ?Eliezer Lofts, [place] ?01/31/22 1019 ? ?

## 2022-01-31 NOTE — Discharge Instructions (Addendum)
Advised patient/husband take medication as directed with food to completion.  Encouraged patient to increase daily water intake while taking this medication.  Advised patient/husband if symptoms worsen and/or unresolved please follow-up with PCP or here for further evaluation. ?

## 2022-01-31 NOTE — ED Triage Notes (Signed)
Patient presents to Urgent Care with complaints of cyst under right armpit since 1 week ago. Patient reports having a previous sebaceous cyst under the same armpit. Was lanced and drained and treated about 5 years. The area is tender and painful with rubbing ?

## 2022-02-07 ENCOUNTER — Ambulatory Visit (INDEPENDENT_AMBULATORY_CARE_PROVIDER_SITE_OTHER): Payer: BC Managed Care – PPO

## 2022-02-07 ENCOUNTER — Other Ambulatory Visit: Payer: Self-pay | Admitting: Family Medicine

## 2022-02-07 ENCOUNTER — Ambulatory Visit (INDEPENDENT_AMBULATORY_CARE_PROVIDER_SITE_OTHER): Payer: BC Managed Care – PPO | Admitting: Family Medicine

## 2022-02-07 VITALS — BP 96/67 | HR 85 | Temp 98.5°F

## 2022-02-07 DIAGNOSIS — R109 Unspecified abdominal pain: Secondary | ICD-10-CM

## 2022-02-07 DIAGNOSIS — R319 Hematuria, unspecified: Secondary | ICD-10-CM

## 2022-02-07 LAB — POCT URINALYSIS DIP (CLINITEK)
Glucose, UA: NEGATIVE mg/dL
Ketones, POC UA: NEGATIVE mg/dL
Leukocytes, UA: NEGATIVE
Nitrite, UA: NEGATIVE
POC PROTEIN,UA: 100 — AB
Spec Grav, UA: >=1.03 — AB (ref 1.010–1.025)
Urobilinogen, UA: 0.2 E.U./dL
pH, UA: 5.5 (ref 5.0–8.0)

## 2022-02-07 NOTE — Progress Notes (Signed)
Medical screening examination/treatment was performed by qualified clinical staff member and as supervising physician I was immediately available for consultation/collaboration. I have reviewed documentation and agree with assessment and plan. ? ?Urinalysis with hematuria and she is having some flank pain.  KUB ordered and urine sent for culture. No other red flags. She may remain on bactrim for now for her soft tissue infection as this is pretty broad spectrum and provides coverage for urinary pathogens.   ? ?Luetta Nutting, DO ? ?

## 2022-02-07 NOTE — Progress Notes (Signed)
? ?  Established Patient Office Visit ? ?Subjective   ?Patient ID: Jessica Ritter, female    DOB: 15-Jun-1961  Age: 61 y.o. MRN: [id number] ? ?Chief Complaint  ?Patient presents with  ? Hematuria  ? ? ?HPI ? ?Jessica Ritter complains of blood in urine for 2 days. Patient reports recent/current antibiotic use. No recent catheterization. Patient not taken Azo. Denies pelvic pain, fever, chills or sweats. She does have left flank pain for 2 days. ? ?ROS ? ?  ?Objective:  ?  ? ?BP 96/67   Pulse 85   Temp 98.5 ?F (36.9 ?C) (Oral)   SpO2 95%  ? ? ?Physical Exam ? ? ?No results found for any visits on 02/07/22. ? ? ? ?The 10-year ASCVD risk score (Arnett DK, et al., 2019) is: 1.4% ? ?  ?Assessment & Plan:  ?Hematuria - U/A positive blood - Large. X-ray ordered.  ? ?Problem List Items Addressed This Visit   ?None ?Visit Diagnoses   ? ? Hematuria, unspecified type    -  Primary  ? Relevant Orders  ? POCT URINALYSIS DIP (CLINITEK)  ? Urine Culture  ? ?  ? ? ?Return if symptoms worsen or fail to improve.  ? ? ?Lavell Luster, [place] ? ?

## 2022-02-11 ENCOUNTER — Encounter: Payer: Self-pay | Admitting: Family Medicine

## 2022-02-11 ENCOUNTER — Other Ambulatory Visit: Payer: Self-pay | Admitting: Family Medicine

## 2022-02-11 DIAGNOSIS — R319 Hematuria, unspecified: Secondary | ICD-10-CM

## 2022-02-11 DIAGNOSIS — R109 Unspecified abdominal pain: Secondary | ICD-10-CM

## 2022-02-11 DIAGNOSIS — R10A2 Flank pain, left side: Secondary | ICD-10-CM

## 2022-02-12 LAB — URINE CULTURE
MICRO NUMBER:: 13296033
SPECIMEN QUALITY:: ADEQUATE

## 2022-02-15 ENCOUNTER — Encounter: Payer: Self-pay | Admitting: Family Medicine

## 2022-02-15 NOTE — Telephone Encounter (Signed)
One is to look at a cyst under the arm ? ?One is for a normal 6 month follow up ?

## 2022-02-17 NOTE — Telephone Encounter (Signed)
Ok to combine into one appt.

## 2022-02-18 ENCOUNTER — Ambulatory Visit (INDEPENDENT_AMBULATORY_CARE_PROVIDER_SITE_OTHER): Payer: BC Managed Care – PPO | Admitting: Family Medicine

## 2022-02-18 ENCOUNTER — Encounter: Payer: Self-pay | Admitting: Family Medicine

## 2022-02-18 VITALS — BP 112/75 | HR 80 | Ht 66.0 in | Wt 141.0 lb

## 2022-02-18 DIAGNOSIS — I10 Essential (primary) hypertension: Secondary | ICD-10-CM

## 2022-02-18 DIAGNOSIS — B0089 Other herpesviral infection: Secondary | ICD-10-CM | POA: Diagnosis not present

## 2022-02-18 DIAGNOSIS — L720 Epidermal cyst: Secondary | ICD-10-CM | POA: Insufficient documentation

## 2022-02-18 DIAGNOSIS — E782 Mixed hyperlipidemia: Secondary | ICD-10-CM

## 2022-02-18 MED ORDER — ATORVASTATIN CALCIUM 80 MG PO TABS: ORAL_TABLET | ORAL | 3 refills | Status: DC

## 2022-02-18 MED ORDER — DOXYCYCLINE HYCLATE 100 MG PO TABS
100.0000 mg | ORAL_TABLET | Freq: Two times a day (BID) | ORAL | 0 refills | Status: AC
Start: 2022-02-18 — End: 2022-02-28

## 2022-02-18 MED ORDER — VALACYCLOVIR HCL 500 MG PO TABS: ORAL_TABLET | ORAL | 1 refills | Status: DC

## 2022-02-18 NOTE — Assessment & Plan Note (Signed)
Blood pressure remains stable at this time. ?

## 2022-02-18 NOTE — Assessment & Plan Note (Signed)
No significant fluctuance at this time.  Adding course of doxycycline.  Instructed to let me know if not improving with this. ?

## 2022-02-18 NOTE — Assessment & Plan Note (Signed)
She may continue Valtrex as needed. ?

## 2022-02-18 NOTE — Patient Instructions (Signed)
Start doxycycline '100mg'$  twice daily for 10 days.  ?Apply warm compress 3-4 times per day.  ? ?Try benefiber to fiber supplement.  ? ?See me again in 6 months.  ?

## 2022-02-18 NOTE — Progress Notes (Signed)
?Jessica Ritter - 61 y.o. female MRN [id number]  Date of birth: Mar 06, 1961 ? ?Subjective ?Chief Complaint  ?Patient presents with  ? Cyst  ? ? ?HPI ?Jessica Ritter is a 61 year old female here today for follow-up visit.   ? ?She has a cyst in her right axilla that has been present for a couple weeks.  Seen at urgent care initially and started on Bactrim.  Has had some slight improvement however worsening again since completing antibiotic.  She has not tried any warm compresses.  There has not been any spontaneous drainage.  She has had a cyst removed from this area before. ? ?Continues to do well with atorvastatin for management of hyperlipidemia. ? ?Blood pressures well controlled at this time.  She denies any chest pain, shortness of breath or new headaches. ? ?ROS:  A comprehensive ROS was completed and negative except as noted per HPI ? ? ?Allergies  ?Allergen Reactions  ? Anticoagulant Compound Other (See Comments)  ?  Subdural Hematoma  ? Wasp Venom Protein Swelling  ? Bee Venom Swelling  ? Warfarin Other (See Comments)  ?  Amyloid angiopathy.  Hih risk of fatal ICH on treatment dose anticoagulants.  Antiplatelet therapy and DVT prophylaxis is okay.  ? Ciprofloxacin Nausea And Vomiting  ?  Vomiting ?  ? ? ?Past Medical History:  ?Diagnosis Date  ? Cancer [facility])   ? metastatic bladder  ? CVA (cerebral vascular accident) ([place]) 03/2015  ? left hemiparesis  ? Diastolic dysfunction 7/93/9030  ? Grade 1  ? 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 Gait disturbance, post-stroke 04/03/2018  ? History of lower GI bleeding 05/2018  ? History of malignant neoplasm metastatic to lung 2002  ? History of malignant neoplasm of brain 2003  ? secondary, renal  ? History of malignant neoplasm of renal pelvis 1998  ? History of multiple pulmonary nodules 2002  ? Hyperlipidemia   ? Hypertension   ? Seizures ([place]) 2016  ? tapered off of Keppra 06/2018  ? Sleep apnea   ? Subdural hematoma caused by  concussion ([place]) 04/2014  ? ? ?Past Surgical History:  ?Procedure Laterality Date  ? BRAIN SURGERY    ? brain tumor surgery    ? COLONOSCOPY W/ POLYPECTOMY  05/2018  ? KIDNEY SURGERY    ? LEG SURGERY    ? LUNG LOBECTOMY Right 2002  ? RLL  ? ? ?Social History  ? ?Socioeconomic History  ? Marital status: Married  ?  Spouse name: Not on file  ? Number of children: Not on file  ? Years of education: Not on file  ? Highest education level: Not on file  ?Occupational History  ? Not on file  ?Tobacco Use  ? Smoking status: Former  ?  Types: Cigarettes  ?  Quit date: 07/21/1997  ?  Years since quitting: 24.5  ? Smokeless tobacco: Never  ?Substance and Sexual Activity  ? Alcohol use: Yes  ?  Alcohol/week: 4.0 standard drinks  ?  Types: 2 Glasses of wine, 2 Standard drinks or equivalent per week  ? Drug use: No  ? Sexual activity: Not Currently  ?  Partners: Male  ?Other Topics Concern  ? Not on file  ?Social History Narrative  ? Not on file  ? ?Social Determinants of Health  ? ?Financial Resource Strain: Not on file  ?Food Insecurity: Not on file  ?Transportation Needs: Not on file  ?Physical Activity: Not on file  ?Stress: Not on  file  ?Social Connections: Not on file  ? ? ?Family History  ?Problem Relation Age of Onset  ? Cancer Other   ?     mother   ? ? ?Health Maintenance  ?Topic Date Due  ? COVID-19 Vaccine (3 - Pfizer risk series) 06/21/2022 (Originally 02/28/2020)  ? Zoster Vaccines- Shingrix (1 of 2) 06/21/2022 (Originally 09/30/1981)  ? MAMMOGRAM  02/19/2023 (Originally 07/27/2021)  ? Hepatitis C Screening  02/19/2023 (Originally 09/30/1980)  ? INFLUENZA VACCINE  05/21/2022  ? PAP SMEAR-Modifier  10/01/2022  ? COLONOSCOPY (Pts 45-37yr Insurance coverage will need to be confirmed)  06/19/2023  ? TETANUS/TDAP  07/03/2027  ? HPV VACCINES  Aged Out  ? HIV Screening  Discontinued   ? ? ? ?----------------------------------------------------------------------------------------------------------------------------------------------------------------------------------------------------------------- ?Physical Exam ?BP 112/75 (BP Location: Left Arm, Patient Position: Sitting, Cuff Size: Normal)   Pulse 80   Ht '5\' 6"'$  (1.676 m)   Wt 141 lb (64 kg)   SpO2 95%   BMI 22.76 kg/m?  ? ?Physical Exam ?Constitutional:   ?   Appearance: Normal appearance.  ?Eyes:  ?   General: No scleral icterus. ?Cardiovascular:  ?   Rate and Rhythm: Normal rate and regular rhythm.  ?Pulmonary:  ?   Effort: Pulmonary effort is normal.  ?   Breath sounds: Normal breath sounds.  ?Musculoskeletal:  ?   Cervical back: Neck supple.  ?Skin: ?   Comments: Indurated area in right axilla without fluctuance.  ?Neurological:  ?   Mental Status: She is alert.  ?Psychiatric:     ?   Mood and Affect: Mood normal.     ?   Behavior: Behavior normal.  ? ? ?------------------------------------------------------------------------------------------------------------------------------------------------------------------------------------------------------------------- ?Assessment and Plan ? ?Hypertension goal BP (blood pressure) < 130/80 ?Blood pressure remains stable at this time. ? ?Recurrent herpes simplex virus (HSV) infection of buttock ?She may continue Valtrex as needed. ? ?Hyperlipidemia ?Tolerating atorvastatin well.  Recommend continuation of current strength. ?Lab Results  ?Component Value Date  ? [id number] 08/31/2021  ? ? ? ?Epidermoid cyst ?No significant fluctuance at this time.  Adding course of doxycycline.  Instructed to let me know if not improving with this. ? ? ?Meds ordered this encounter  ?Medications  ? valACYclovir (VALTREX) 500 MG tablet  ?  Sig: TAKE 1 TABLET BY MOUTH TWICE DAILY FOR 3 DAYS AS NEEDED FOR  OUTBREAKS  ?  Dispense:  30 tablet  ?  Refill:  1  ? atorvastatin (LIPITOR) 80 MG tablet  ?  Sig: TAKE 1  TABLET BY MOUTH ONCE DAILY AT  6  PM.  ?  Dispense:  90 tablet  ?  Refill:  3  ? doxycycline (VIBRA-TABS) 100 MG tablet  ?  Sig: Take 1 tablet (100 mg total) by mouth 2 (two) times daily for 10 days.  ?  Dispense:  20 tablet  ?  Refill:  0  ? ? ?Return in about 6 months (around 08/21/2022) for HLD/Labs. ? ? ? ?This visit occurred during the SARS-CoV-2 public health emergency.  Safety protocols were in place, including screening questions prior to the visit, additional usage of staff PPE, and extensive cleaning of exam room while observing appropriate contact time as indicated for disinfecting solutions.  ? ?

## 2022-02-18 NOTE — Assessment & Plan Note (Signed)
Tolerating atorvastatin well.  Recommend continuation of current strength. ?Lab Results  ?Component Value Date  ? [place] 70 08/31/2021  ? ? ?

## 2022-03-01 ENCOUNTER — Ambulatory Visit: Payer: BC Managed Care – PPO | Admitting: Family Medicine

## 2022-06-01 ENCOUNTER — Encounter: Payer: Self-pay | Admitting: Family Medicine

## 2022-06-03 ENCOUNTER — Encounter: Payer: Self-pay | Admitting: Family Medicine

## 2022-06-03 NOTE — Telephone Encounter (Signed)
Yes, ok for letter. Not appropriate for her to serve on [employer].   Thanks!  CM

## 2022-06-20 ENCOUNTER — Ambulatory Visit (INDEPENDENT_AMBULATORY_CARE_PROVIDER_SITE_OTHER): Payer: BC Managed Care – PPO | Admitting: Family Medicine

## 2022-06-20 ENCOUNTER — Encounter: Payer: Self-pay | Admitting: Family Medicine

## 2022-06-20 VITALS — BP 120/80 | HR 70 | Ht 66.0 in | Wt 169.0 lb

## 2022-06-20 DIAGNOSIS — R319 Hematuria, unspecified: Secondary | ICD-10-CM | POA: Insufficient documentation

## 2022-06-20 DIAGNOSIS — N3001 Acute cystitis with hematuria: Secondary | ICD-10-CM | POA: Insufficient documentation

## 2022-06-20 MED ORDER — CEPHALEXIN 500 MG PO CAPS: 500.0000 mg | ORAL_CAPSULE | Freq: Four times a day (QID) | ORAL | 0 refills | Status: AC

## 2022-06-20 NOTE — Assessment & Plan Note (Addendum)
-   will start on keflex for uti based on symptoms. Due to pts pmh it is hard to determine exact symptoms since she wears adult diapers and is unsure if she has frequency with her diaper changes.

## 2022-06-20 NOTE — Assessment & Plan Note (Signed)
-   ordered CBC to check Hgb level  -CMP ordered to look at kidney function and see what cr and gfr level are  - unclear etiology as patient was unable to provide sample today. Differential: could be from urine or vagina - if it does not resolve will order KUB and look for stone. Holding off on imaging for now since pt denies back pain and denies CVA tenderness bilaterally.

## 2022-06-20 NOTE — Progress Notes (Signed)
Acute Office Visit  Subjective:     Patient ID: Jessica Ritter, female    DOB: Oct 25, 1960, 61 y.o.   MRN: [id number]  Chief Complaint  Patient presents with   Urinary Tract Infection    HPI Patient is in today for urinary symptoms with visible hematuria. Her symptoms started 3 days ago. She says she was wearing underwear from the store and noted some blood with a soaked diaper. She denies pain, chills, back pain and fevers.  She was prescribed an antibiotic after a sebaceous cyst removal at urgent care back in April and patient said the hematuria happened at that time. Looking back in epic notes, she was given bactrim. She does have a history of one kidney.   Review of Systems  Constitutional:  Negative for chills and fever.  Respiratory:  Negative for cough and shortness of breath.   Cardiovascular:  Negative for chest pain.  Neurological:  Negative for headaches.      Objective:    BP 120/80   Pulse 70   Ht '5\' 6"'$  (1.676 m)   Wt 169 lb (76.7 kg)   SpO2 95%   BMI 27.28 kg/m    Physical Exam Vitals and nursing note reviewed.  Constitutional:      General: She is not in acute distress.    Appearance: Normal appearance.     Comments: Presents in wheelchair  HENT:     Head: Normocephalic and atraumatic.     Right Ear: External ear normal.     Left Ear: External ear normal.     Nose: Nose normal.  Eyes:     Conjunctiva/sclera: Conjunctivae normal.  Cardiovascular:     Rate and Rhythm: Normal rate and regular rhythm.  Pulmonary:     Effort: Pulmonary effort is normal.     Breath sounds: Normal breath sounds.  Abdominal:     Tenderness: There is no right CVA tenderness or left CVA tenderness.  Neurological:     General: No focal deficit present.     Mental Status: She is alert and oriented to person, place, and time.  Psychiatric:        Mood and Affect: Mood normal.        Behavior: Behavior normal.        Thought Content: Thought content normal.         Judgment: Judgment normal.     No results found for any visits on 06/20/22.      Assessment & Plan:   Problem List Items Addressed This Visit       Genitourinary   Acute cystitis with hematuria - Primary    - will start on keflex for uti based on symptoms. Due to pts pmh it is hard to determine exact symptoms since she wears adult diapers and is unsure if she has frequency with her diaper changes.       Relevant Medications   cephALEXin (KEFLEX) 500 MG capsule     Other   Hematuria    - ordered CBC to check Hgb level  -CMP ordered to look at kidney function and see what cr and gfr level are  - unclear etiology as patient was unable to provide sample today. Differential: could be from urine or vagina - if it does not resolve will order KUB and look for stone. Holding off on imaging for now since pt denies back pain and denies CVA tenderness bilaterally.       Relevant Orders   CBC  COMPLETE METABOLIC PANEL WITH GFR    Meds ordered this encounter  Medications   cephALEXin (KEFLEX) 500 MG capsule    Sig: Take 1 capsule (500 mg total) by mouth 4 (four) times daily for 7 days.    Dispense:  28 capsule    Refill:  0    Return in about 5 days (around 06/25/2022).  Owens Loffler, DO

## 2022-06-21 DIAGNOSIS — R319 Hematuria, unspecified: Secondary | ICD-10-CM | POA: Diagnosis not present

## 2022-06-21 DIAGNOSIS — N3001 Acute cystitis with hematuria: Secondary | ICD-10-CM | POA: Diagnosis not present

## 2022-06-21 LAB — POCT URINALYSIS DIP (CLINITEK)
Bilirubin, UA: NEGATIVE
Glucose, UA: NEGATIVE mg/dL
Ketones, POC UA: NEGATIVE mg/dL
Nitrite, UA: POSITIVE — AB
POC PROTEIN,UA: NEGATIVE
Spec Grav, UA: 1.015 (ref 1.010–1.025)
Urobilinogen, UA: 0.2 E.U./dL
pH, UA: 5.5 (ref 5.0–8.0)

## 2022-06-21 LAB — COMPLETE METABOLIC PANEL WITH GFR
AG Ratio: 1.7 (calc) (ref 1.0–2.5)
ALT: 19 U/L (ref 6–29)
AST: 19 U/L (ref 10–35)
Albumin: 4.3 g/dL (ref 3.6–5.1)
Alkaline phosphatase (APISO): 71 U/L (ref 37–153)
BUN/Creatinine Ratio: 30 (calc) — ABNORMAL HIGH (ref 6–22)
BUN: 28 mg/dL — ABNORMAL HIGH (ref 7–25)
CO2: 26 mmol/L (ref 20–32)
Calcium: 10 mg/dL (ref 8.6–10.4)
Chloride: 107 mmol/L (ref 98–110)
Creat: 0.93 mg/dL (ref 0.50–1.03)
Globulin: 2.5 g/dL (calc) (ref 1.9–3.7)
Glucose, Bld: 91 mg/dL (ref 65–99)
Potassium: 4 mmol/L (ref 3.5–5.3)
Sodium: 143 mmol/L (ref 135–146)
Total Bilirubin: 1.3 mg/dL — ABNORMAL HIGH (ref 0.2–1.2)
Total Protein: 6.8 g/dL (ref 6.1–8.1)
eGFR: 71 mL/min/{1.73_m2} (ref >=60)

## 2022-06-21 LAB — CBC
HCT: 38.3 % (ref 35.0–45.0)
Hemoglobin: 13 g/dL (ref 11.7–15.5)
MCH: 31.1 pg (ref 27.0–33.0)
MCHC: 33.9 g/dL (ref 32.0–36.0)
MCV: 91.6 fL (ref 80.0–100.0)
MPV: 10.1 fL (ref 7.5–12.5)
Platelets: 171 10*3/uL (ref 140–400)
RBC: 4.18 10*6/uL (ref 3.80–5.10)
RDW: 12.8 % (ref 11.0–15.0)
WBC: 6.7 10*3/uL (ref 3.8–10.8)

## 2022-06-21 NOTE — Addendum Note (Signed)
Addended by: Cline Crock on: 06/21/2022 04:51 PM   Modules accepted: Orders

## 2022-06-24 LAB — URINE CULTURE
MICRO NUMBER:: 13868381
SPECIMEN QUALITY:: ADEQUATE

## 2022-08-23 ENCOUNTER — Ambulatory Visit (INDEPENDENT_AMBULATORY_CARE_PROVIDER_SITE_OTHER): Payer: BC Managed Care – PPO | Admitting: Family Medicine

## 2022-08-23 ENCOUNTER — Encounter: Payer: Self-pay | Admitting: Family Medicine

## 2022-08-23 VITALS — BP 107/71 | HR 76 | Ht 66.0 in | Wt 169.0 lb

## 2022-08-23 DIAGNOSIS — I10 Essential (primary) hypertension: Secondary | ICD-10-CM

## 2022-08-23 DIAGNOSIS — B0089 Other herpesviral infection: Secondary | ICD-10-CM | POA: Diagnosis not present

## 2022-08-23 DIAGNOSIS — E782 Mixed hyperlipidemia: Secondary | ICD-10-CM

## 2022-08-23 DIAGNOSIS — M545 Low back pain, unspecified: Secondary | ICD-10-CM | POA: Insufficient documentation

## 2022-08-23 DIAGNOSIS — M5441 Lumbago with sciatica, right side: Secondary | ICD-10-CM | POA: Diagnosis not present

## 2022-08-23 DIAGNOSIS — G8929 Other chronic pain: Secondary | ICD-10-CM

## 2022-08-23 MED ORDER — MELOXICAM 7.5 MG PO TABS: 7.5000 mg | ORAL_TABLET | Freq: Every day | ORAL | 0 refills | Status: DC

## 2022-08-23 MED ORDER — AMBULATORY NON FORMULARY MEDICATION: 0 refills | Status: DC

## 2022-08-23 NOTE — Assessment & Plan Note (Signed)
BP remains well controlled.

## 2022-08-23 NOTE — Assessment & Plan Note (Signed)
Declines steroid.  Will use short term meloxicam x2 weeks for back pain.  Will plan for imaging if not improving.

## 2022-08-23 NOTE — Progress Notes (Signed)
Jessica Ritter - 61 y.o. female MRN [id number]  Date of birth: 22-Jul-1961  Subjective Chief Complaint  Patient presents with   Hyperlipidemia   Headache    HPI Jessica Ritter is a 61 y.o. female here today for follow up visit.     She reports that she is doing well at this time.   No significant changes to her health since last visit.  She does report that she is having some back pain.  This radiates to her R leg.  This is worse with laying down.  No increased weakness.   Remains on atorvastatin for management of HLD.  She remains tolerant of this without side effects.  Due for updated lipid panel.    Remains on valtrex as needed for HSV outbreaks.  Has not needed this recently.   Would like to start a light exercise program.  Needing note for HSA to pay for facility.   ROS:  A comprehensive ROS was completed and negative except as noted per HPI  Allergies  Allergen Reactions   Anticoagulant Compound Other (See Comments)    Subdural Hematoma   Wasp Venom Protein Swelling   Bee Venom Swelling   Warfarin Other (See Comments)    Amyloid angiopathy.  Hih risk of fatal ICH on treatment dose anticoagulants.  Antiplatelet therapy and DVT prophylaxis is okay.   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   Gait disturbance, post-stroke 04/03/2018   History of lower GI bleeding 05/2018   History of malignant neoplasm metastatic to lung 2002   History of malignant neoplasm of brain 2003   secondary, renal   History of malignant neoplasm of renal pelvis 1998   History of multiple pulmonary nodules 2002   Hyperlipidemia    Hypertension    Seizures ([place]) 2016   tapered off of Keppra 06/2018   Sleep apnea     Subdural hematoma caused by concussion ([place]) 04/2014    Past Surgical History:  Procedure Laterality Date   BRAIN SURGERY     brain tumor surgery     COLONOSCOPY W/ POLYPECTOMY  05/2018   KIDNEY SURGERY     LEG SURGERY     LUNG LOBECTOMY Right 2002   RLL    Social History   Socioeconomic History   Marital status: Married    Spouse name: Not on file   Number of children: Not on file   Years of education: Not on file   Highest education level: Not on file  Occupational History   Not on file  Tobacco Use   Smoking status: Former    Types: Cigarettes    Quit date: 07/21/1997    Years since quitting: 25.1   Smokeless tobacco: Never  Substance and Sexual Activity   Alcohol use: Yes    Alcohol/week: 4.0 standard drinks of alcohol    Types: 2 Glasses of wine, 2 Standard drinks or equivalent per week   Drug use: No   Sexual activity: Not Currently    Partners: Male  Other Topics Concern   Not on file  Social History Narrative   Not on file   Social Determinants of Health   Financial Resource Strain: Not on  file  Food Insecurity: Not on file  Transportation Needs: Not on file  Physical Activity: Not on file  Stress: Not on file  Social Connections: Not on file    Family History  Problem Relation Age of Onset   Cancer Other        mother     Health Maintenance  Topic Date Due   PAP SMEAR-Modifier  10/01/2022   COVID-19 Vaccine (3 - Pfizer risk series) 11/21/2022 (Originally 02/28/2020)   Zoster Vaccines- Shingrix (1 of 2) 11/23/2022 (Originally 09/30/1981)   INFLUENZA VACCINE  01/19/2023 (Originally 05/21/2022)   MAMMOGRAM  02/19/2023 (Originally 07/27/2021)   Hepatitis C Screening  02/19/2023 (Originally 09/30/1980)   COLONOSCOPY (Pts 45-21yr Insurance coverage will need to be confirmed)  06/19/2023   TETANUS/TDAP  07/03/2027   HPV VACCINES  Aged Out   HIV Screening  Discontinued      ----------------------------------------------------------------------------------------------------------------------------------------------------------------------------------------------------------------- Physical Exam BP 107/71 (BP Location: Left Arm, Patient Position: Sitting, Cuff Size: Large)   Pulse 76   Ht '5\' 6"'$  (1.676 m)   Wt 169 lb (76.7 kg)   SpO2 96%   BMI 27.28 kg/m   Physical Exam Eyes:     General: No scleral icterus. Cardiovascular:     Rate and Rhythm: Normal rate and regular rhythm.  Pulmonary:     Effort: Pulmonary effort is normal.     Breath sounds: Normal breath sounds.  Neurological:     Mental Status: She is alert.  Psychiatric:        Mood and Affect: Mood normal.        Behavior: Behavior normal.     ------------------------------------------------------------------------------------------------------------------------------------------------------------------------------------------------------------------- Assessment and Plan  Hypertension goal BP (blood pressure) < 130/80 BP remains well controlled.    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   Low back pain Declines steroid.  Will use short term meloxicam x2 weeks for back pain.  Will plan for imaging if not improving.    Hyperlipidemia Update lipid panel. Continue atorvastatin at current strength.    Meds ordered this encounter  Medications   meloxicam (MOBIC) 7.5 MG tablet    Sig: Take 1 tablet (7.5 mg total) by mouth daily.    Dispense:  30 tablet    Refill:  0   AMBULATORY NON FORMULARY MEDICATION    Sig: I recommend that MYancy Hascallwould benefit from increased physical activity.    Dispense:  1 Units    Refill:  0    Return in about 6 months (around 02/21/2023) for Annual exam/Fasting labs.    This visit occurred during the SARS-CoV-2 public health emergency.  Safety protocols were in place, including screening  questions prior to the visit, additional usage of staff PPE, and extensive cleaning of exam room while observing appropriate contact time as indicated for disinfecting solutions.

## 2022-08-23 NOTE — Patient Instructions (Signed)
Try meloxicam for up to 2 weeks.  You can take tylenol '1000mg'$  every 8 hours as needed.  Let me know if back pain is not improving.

## 2022-08-23 NOTE — Assessment & Plan Note (Signed)
Continue valtrex as needed.  

## 2022-08-23 NOTE — Assessment & Plan Note (Signed)
Update lipid panel. Continue atorvastatin at current strength.

## 2022-08-24 LAB — LIPID PANEL
Cholesterol: 126 mg/dL (ref <200)
HDL: 49 mg/dL — ABNORMAL LOW (ref >=50)
LDL Cholesterol (Calc): 57 mg/dL (calc)
Non-HDL Cholesterol (Calc): 77 mg/dL (calc) (ref <130)
Total CHOL/HDL Ratio: 2.6 (calc) (ref <5.0)
Triglycerides: 112 mg/dL (ref <150)

## 2022-09-06 ENCOUNTER — Encounter: Payer: Self-pay | Admitting: Family Medicine

## 2022-09-06 DIAGNOSIS — M545 Low back pain, unspecified: Secondary | ICD-10-CM

## 2022-09-09 MED ORDER — PREDNISONE 50 MG PO TABS: ORAL_TABLET | ORAL | 0 refills | Status: DC

## 2022-09-17 ENCOUNTER — Ambulatory Visit (INDEPENDENT_AMBULATORY_CARE_PROVIDER_SITE_OTHER): Payer: BC Managed Care – PPO

## 2022-09-17 DIAGNOSIS — G8929 Other chronic pain: Secondary | ICD-10-CM | POA: Diagnosis not present

## 2022-09-17 DIAGNOSIS — M545 Low back pain, unspecified: Secondary | ICD-10-CM | POA: Diagnosis not present

## 2022-09-17 DIAGNOSIS — M47816 Spondylosis without myelopathy or radiculopathy, lumbar region: Secondary | ICD-10-CM | POA: Diagnosis not present

## 2022-10-06 ENCOUNTER — Encounter: Payer: Self-pay | Admitting: Family Medicine

## 2022-12-09 ENCOUNTER — Telehealth: Payer: Self-pay

## 2022-12-09 NOTE — Telephone Encounter (Signed)
Can we have him send a mychart message with symptoms and we can do a mychart visit and have them drop off a urine specimen?  Thanks!  CM

## 2022-12-09 NOTE — Telephone Encounter (Signed)
Spouse lvm stating his wife has UTI. Requesting medication. Please advise.

## 2022-12-10 ENCOUNTER — Encounter: Payer: Self-pay | Admitting: Family Medicine

## 2022-12-10 ENCOUNTER — Encounter (INDEPENDENT_AMBULATORY_CARE_PROVIDER_SITE_OTHER): Payer: Medicare Other | Admitting: Family Medicine

## 2022-12-10 DIAGNOSIS — N3000 Acute cystitis without hematuria: Secondary | ICD-10-CM | POA: Diagnosis not present

## 2022-12-10 NOTE — Telephone Encounter (Signed)
Forward all messages directly to Dr. Zigmund Daniel, please.   Mr. Sepich will be sending a MyChart message with Ms. Kathleena symptoms. He will also drop off a urine specimen tomorrow.

## 2022-12-11 ENCOUNTER — Encounter: Payer: Self-pay | Admitting: Family Medicine

## 2022-12-11 MED ORDER — NITROFURANTOIN MONOHYD MACRO 100 MG PO CAPS: 100.0000 mg | ORAL_CAPSULE | Freq: Two times a day (BID) | ORAL | 0 refills | Status: AC

## 2022-12-11 NOTE — Telephone Encounter (Signed)
Please see the MyChart message reply(ies) for my assessment and plan.    This patient gave consent for this Medical Advice Message and is aware that it may result in a bill to their insurance company, as well as the possibility of receiving a bill for a co-payment or deductible. They are an established patient, but are not seeking medical advice exclusively about a problem treated during an in person or video visit in the last seven days. I did not recommend an in person or video visit within seven days of my reply.    I spent a total of 6 minutes cumulative time within 7 days through MyChart messaging.  Syrianna Schillaci, DO   

## 2022-12-17 ENCOUNTER — Other Ambulatory Visit: Payer: Self-pay | Admitting: Family Medicine

## 2022-12-17 DIAGNOSIS — B0089 Other herpesviral infection: Secondary | ICD-10-CM

## 2022-12-20 ENCOUNTER — Telehealth: Payer: Self-pay | Admitting: Sports Medicine

## 2022-12-20 DIAGNOSIS — I69398 Other sequelae of cerebral infarction: Secondary | ICD-10-CM

## 2022-12-20 NOTE — Assessment & Plan Note (Signed)
I saw Jessica Ritter's husband today, he has.  I placed an order for physical therapy to give her help with mobility, it sounds like she is significantly deconditioned after significant cerebral insults including subdural hematomas, strokes. Per his report she is not hemiparetic. She really needs to follows up with her PCP but I will go and place a referral for home health physical therapy. We should also consider referral to physiatry if insufficient improvement.

## 2022-12-20 NOTE — Telephone Encounter (Signed)
Gait disturbance, post-stroke I saw Poetry's husband today, he has.  I placed an order for physical therapy to give her help with mobility, it sounds like she is significantly deconditioned after significant cerebral insults including subdural hematomas, strokes. Per his report she is not hemiparetic. She really needs to follows up with her PCP but I will go and place a referral for home health physical therapy. We should also consider referral to physiatry if insufficient improvement.

## 2022-12-24 DIAGNOSIS — G4730 Sleep apnea, unspecified: Secondary | ICD-10-CM | POA: Diagnosis not present

## 2022-12-24 DIAGNOSIS — Z85118 Personal history of other malignant neoplasm of bronchus and lung: Secondary | ICD-10-CM | POA: Diagnosis not present

## 2022-12-24 DIAGNOSIS — Z87891 Personal history of nicotine dependence: Secondary | ICD-10-CM | POA: Diagnosis not present

## 2022-12-24 DIAGNOSIS — G8929 Other chronic pain: Secondary | ICD-10-CM | POA: Diagnosis not present

## 2022-12-24 DIAGNOSIS — M5441 Lumbago with sciatica, right side: Secondary | ICD-10-CM | POA: Diagnosis not present

## 2022-12-24 DIAGNOSIS — I69322 Dysarthria following cerebral infarction: Secondary | ICD-10-CM | POA: Diagnosis not present

## 2022-12-24 DIAGNOSIS — Z85841 Personal history of malignant neoplasm of brain: Secondary | ICD-10-CM | POA: Diagnosis not present

## 2022-12-24 DIAGNOSIS — Z9181 History of falling: Secondary | ICD-10-CM | POA: Diagnosis not present

## 2022-12-24 DIAGNOSIS — K219 Gastro-esophageal reflux disease without esophagitis: Secondary | ICD-10-CM | POA: Diagnosis not present

## 2022-12-24 DIAGNOSIS — Z8551 Personal history of malignant neoplasm of bladder: Secondary | ICD-10-CM | POA: Diagnosis not present

## 2022-12-24 DIAGNOSIS — E782 Mixed hyperlipidemia: Secondary | ICD-10-CM | POA: Diagnosis not present

## 2022-12-24 DIAGNOSIS — I69354 Hemiplegia and hemiparesis following cerebral infarction affecting left non-dominant side: Secondary | ICD-10-CM | POA: Diagnosis not present

## 2022-12-24 DIAGNOSIS — I10 Essential (primary) hypertension: Secondary | ICD-10-CM | POA: Diagnosis not present

## 2022-12-26 DIAGNOSIS — I10 Essential (primary) hypertension: Secondary | ICD-10-CM | POA: Diagnosis not present

## 2022-12-26 DIAGNOSIS — Z85841 Personal history of malignant neoplasm of brain: Secondary | ICD-10-CM | POA: Diagnosis not present

## 2022-12-26 DIAGNOSIS — K219 Gastro-esophageal reflux disease without esophagitis: Secondary | ICD-10-CM | POA: Diagnosis not present

## 2022-12-26 DIAGNOSIS — M5441 Lumbago with sciatica, right side: Secondary | ICD-10-CM | POA: Diagnosis not present

## 2022-12-26 DIAGNOSIS — Z85118 Personal history of other malignant neoplasm of bronchus and lung: Secondary | ICD-10-CM | POA: Diagnosis not present

## 2022-12-26 DIAGNOSIS — I69322 Dysarthria following cerebral infarction: Secondary | ICD-10-CM | POA: Diagnosis not present

## 2022-12-26 DIAGNOSIS — Z8551 Personal history of malignant neoplasm of bladder: Secondary | ICD-10-CM | POA: Diagnosis not present

## 2022-12-26 DIAGNOSIS — Z87891 Personal history of nicotine dependence: Secondary | ICD-10-CM | POA: Diagnosis not present

## 2022-12-26 DIAGNOSIS — G4730 Sleep apnea, unspecified: Secondary | ICD-10-CM | POA: Diagnosis not present

## 2022-12-26 DIAGNOSIS — G8929 Other chronic pain: Secondary | ICD-10-CM | POA: Diagnosis not present

## 2022-12-26 DIAGNOSIS — I69354 Hemiplegia and hemiparesis following cerebral infarction affecting left non-dominant side: Secondary | ICD-10-CM | POA: Diagnosis not present

## 2022-12-26 DIAGNOSIS — Z9181 History of falling: Secondary | ICD-10-CM | POA: Diagnosis not present

## 2022-12-26 DIAGNOSIS — E782 Mixed hyperlipidemia: Secondary | ICD-10-CM | POA: Diagnosis not present

## 2023-01-01 ENCOUNTER — Telehealth: Payer: Self-pay | Admitting: Family Medicine

## 2023-01-01 DIAGNOSIS — I69322 Dysarthria following cerebral infarction: Secondary | ICD-10-CM | POA: Diagnosis not present

## 2023-01-01 DIAGNOSIS — K219 Gastro-esophageal reflux disease without esophagitis: Secondary | ICD-10-CM | POA: Diagnosis not present

## 2023-01-01 DIAGNOSIS — Z85841 Personal history of malignant neoplasm of brain: Secondary | ICD-10-CM | POA: Diagnosis not present

## 2023-01-01 DIAGNOSIS — E782 Mixed hyperlipidemia: Secondary | ICD-10-CM | POA: Diagnosis not present

## 2023-01-01 DIAGNOSIS — M5441 Lumbago with sciatica, right side: Secondary | ICD-10-CM | POA: Diagnosis not present

## 2023-01-01 DIAGNOSIS — I10 Essential (primary) hypertension: Secondary | ICD-10-CM | POA: Diagnosis not present

## 2023-01-01 DIAGNOSIS — I69354 Hemiplegia and hemiparesis following cerebral infarction affecting left non-dominant side: Secondary | ICD-10-CM | POA: Diagnosis not present

## 2023-01-01 DIAGNOSIS — Z8551 Personal history of malignant neoplasm of bladder: Secondary | ICD-10-CM | POA: Diagnosis not present

## 2023-01-01 DIAGNOSIS — Z9181 History of falling: Secondary | ICD-10-CM | POA: Diagnosis not present

## 2023-01-01 DIAGNOSIS — Z87891 Personal history of nicotine dependence: Secondary | ICD-10-CM | POA: Diagnosis not present

## 2023-01-01 DIAGNOSIS — Z85118 Personal history of other malignant neoplasm of bronchus and lung: Secondary | ICD-10-CM | POA: Diagnosis not present

## 2023-01-01 DIAGNOSIS — G8929 Other chronic pain: Secondary | ICD-10-CM | POA: Diagnosis not present

## 2023-01-01 DIAGNOSIS — G4730 Sleep apnea, unspecified: Secondary | ICD-10-CM | POA: Diagnosis not present

## 2023-01-01 NOTE — Telephone Encounter (Signed)
Aldona Bar from [facility] called in regards to verbal orders that are needed for the pt. Aldona Bar stated without them they will need to discharge the pt. Please call her back at [phone]. Thank you

## 2023-01-01 NOTE — Telephone Encounter (Signed)
Agree with VO

## 2023-01-01 NOTE — Telephone Encounter (Signed)
Returned call to PT. They stated that several calls had been made to the office and voicemails left concerning VO's.   Advised this was the 1st message received from [place].  Requesting VO's for PT, OT, MSW eval. 1 x 3 weeks 1 x EOW 1 x 1w 2 x 3w 1 x 5  VO's given per Dr. Zigmund Daniel.

## 2023-01-02 DIAGNOSIS — Z85118 Personal history of other malignant neoplasm of bronchus and lung: Secondary | ICD-10-CM | POA: Diagnosis not present

## 2023-01-02 DIAGNOSIS — K219 Gastro-esophageal reflux disease without esophagitis: Secondary | ICD-10-CM | POA: Diagnosis not present

## 2023-01-02 DIAGNOSIS — G8929 Other chronic pain: Secondary | ICD-10-CM | POA: Diagnosis not present

## 2023-01-02 DIAGNOSIS — I69354 Hemiplegia and hemiparesis following cerebral infarction affecting left non-dominant side: Secondary | ICD-10-CM | POA: Diagnosis not present

## 2023-01-02 DIAGNOSIS — G4730 Sleep apnea, unspecified: Secondary | ICD-10-CM | POA: Diagnosis not present

## 2023-01-02 DIAGNOSIS — Z8551 Personal history of malignant neoplasm of bladder: Secondary | ICD-10-CM | POA: Diagnosis not present

## 2023-01-02 DIAGNOSIS — I69322 Dysarthria following cerebral infarction: Secondary | ICD-10-CM | POA: Diagnosis not present

## 2023-01-02 DIAGNOSIS — M5441 Lumbago with sciatica, right side: Secondary | ICD-10-CM | POA: Diagnosis not present

## 2023-01-02 DIAGNOSIS — E782 Mixed hyperlipidemia: Secondary | ICD-10-CM | POA: Diagnosis not present

## 2023-01-02 DIAGNOSIS — Z9181 History of falling: Secondary | ICD-10-CM | POA: Diagnosis not present

## 2023-01-02 DIAGNOSIS — I10 Essential (primary) hypertension: Secondary | ICD-10-CM | POA: Diagnosis not present

## 2023-01-02 DIAGNOSIS — Z85841 Personal history of malignant neoplasm of brain: Secondary | ICD-10-CM | POA: Diagnosis not present

## 2023-01-02 DIAGNOSIS — Z87891 Personal history of nicotine dependence: Secondary | ICD-10-CM | POA: Diagnosis not present

## 2023-01-03 ENCOUNTER — Telehealth: Payer: Self-pay

## 2023-01-03 DIAGNOSIS — K219 Gastro-esophageal reflux disease without esophagitis: Secondary | ICD-10-CM | POA: Diagnosis not present

## 2023-01-03 DIAGNOSIS — G8929 Other chronic pain: Secondary | ICD-10-CM | POA: Diagnosis not present

## 2023-01-03 DIAGNOSIS — E782 Mixed hyperlipidemia: Secondary | ICD-10-CM | POA: Diagnosis not present

## 2023-01-03 DIAGNOSIS — Z87891 Personal history of nicotine dependence: Secondary | ICD-10-CM | POA: Diagnosis not present

## 2023-01-03 DIAGNOSIS — G4730 Sleep apnea, unspecified: Secondary | ICD-10-CM | POA: Diagnosis not present

## 2023-01-03 DIAGNOSIS — M5441 Lumbago with sciatica, right side: Secondary | ICD-10-CM | POA: Diagnosis not present

## 2023-01-03 DIAGNOSIS — I69322 Dysarthria following cerebral infarction: Secondary | ICD-10-CM | POA: Diagnosis not present

## 2023-01-03 DIAGNOSIS — Z85118 Personal history of other malignant neoplasm of bronchus and lung: Secondary | ICD-10-CM | POA: Diagnosis not present

## 2023-01-03 DIAGNOSIS — I10 Essential (primary) hypertension: Secondary | ICD-10-CM | POA: Diagnosis not present

## 2023-01-03 DIAGNOSIS — I69354 Hemiplegia and hemiparesis following cerebral infarction affecting left non-dominant side: Secondary | ICD-10-CM | POA: Diagnosis not present

## 2023-01-03 DIAGNOSIS — I69398 Other sequelae of cerebral infarction: Secondary | ICD-10-CM

## 2023-01-03 DIAGNOSIS — Z8551 Personal history of malignant neoplasm of bladder: Secondary | ICD-10-CM | POA: Diagnosis not present

## 2023-01-03 DIAGNOSIS — Z9181 History of falling: Secondary | ICD-10-CM | POA: Diagnosis not present

## 2023-01-03 DIAGNOSIS — Z85841 Personal history of malignant neoplasm of brain: Secondary | ICD-10-CM | POA: Diagnosis not present

## 2023-01-03 MED ORDER — AMBULATORY NON FORMULARY MEDICATION: 99 refills | Status: DC

## 2023-01-03 NOTE — Telephone Encounter (Signed)
[place] from [place] is requesting an order for beside commode and front wheeled walker.   Ordered printed and faxed to [facility] per Victoria's request.   [facility] fax [phone] Phone  [phone]

## 2023-01-06 ENCOUNTER — Telehealth: Payer: Self-pay

## 2023-01-06 NOTE — Telephone Encounter (Signed)
Error

## 2023-01-07 DIAGNOSIS — I10 Essential (primary) hypertension: Secondary | ICD-10-CM | POA: Diagnosis not present

## 2023-01-07 DIAGNOSIS — G4730 Sleep apnea, unspecified: Secondary | ICD-10-CM | POA: Diagnosis not present

## 2023-01-07 DIAGNOSIS — Z87891 Personal history of nicotine dependence: Secondary | ICD-10-CM | POA: Diagnosis not present

## 2023-01-07 DIAGNOSIS — I69322 Dysarthria following cerebral infarction: Secondary | ICD-10-CM | POA: Diagnosis not present

## 2023-01-07 DIAGNOSIS — Z8551 Personal history of malignant neoplasm of bladder: Secondary | ICD-10-CM | POA: Diagnosis not present

## 2023-01-07 DIAGNOSIS — E782 Mixed hyperlipidemia: Secondary | ICD-10-CM | POA: Diagnosis not present

## 2023-01-07 DIAGNOSIS — Z85118 Personal history of other malignant neoplasm of bronchus and lung: Secondary | ICD-10-CM | POA: Diagnosis not present

## 2023-01-07 DIAGNOSIS — I69354 Hemiplegia and hemiparesis following cerebral infarction affecting left non-dominant side: Secondary | ICD-10-CM | POA: Diagnosis not present

## 2023-01-07 DIAGNOSIS — M5441 Lumbago with sciatica, right side: Secondary | ICD-10-CM | POA: Diagnosis not present

## 2023-01-07 DIAGNOSIS — Z9181 History of falling: Secondary | ICD-10-CM | POA: Diagnosis not present

## 2023-01-07 DIAGNOSIS — G8929 Other chronic pain: Secondary | ICD-10-CM | POA: Diagnosis not present

## 2023-01-07 DIAGNOSIS — Z85841 Personal history of malignant neoplasm of brain: Secondary | ICD-10-CM | POA: Diagnosis not present

## 2023-01-07 DIAGNOSIS — K219 Gastro-esophageal reflux disease without esophagitis: Secondary | ICD-10-CM | POA: Diagnosis not present

## 2023-01-08 DIAGNOSIS — I10 Essential (primary) hypertension: Secondary | ICD-10-CM | POA: Diagnosis not present

## 2023-01-08 DIAGNOSIS — M5441 Lumbago with sciatica, right side: Secondary | ICD-10-CM | POA: Diagnosis not present

## 2023-01-08 DIAGNOSIS — G8929 Other chronic pain: Secondary | ICD-10-CM | POA: Diagnosis not present

## 2023-01-08 DIAGNOSIS — Z85841 Personal history of malignant neoplasm of brain: Secondary | ICD-10-CM | POA: Diagnosis not present

## 2023-01-08 DIAGNOSIS — K219 Gastro-esophageal reflux disease without esophagitis: Secondary | ICD-10-CM | POA: Diagnosis not present

## 2023-01-08 DIAGNOSIS — Z87891 Personal history of nicotine dependence: Secondary | ICD-10-CM | POA: Diagnosis not present

## 2023-01-08 DIAGNOSIS — I69322 Dysarthria following cerebral infarction: Secondary | ICD-10-CM | POA: Diagnosis not present

## 2023-01-08 DIAGNOSIS — E782 Mixed hyperlipidemia: Secondary | ICD-10-CM | POA: Diagnosis not present

## 2023-01-08 DIAGNOSIS — I69354 Hemiplegia and hemiparesis following cerebral infarction affecting left non-dominant side: Secondary | ICD-10-CM | POA: Diagnosis not present

## 2023-01-08 DIAGNOSIS — Z8551 Personal history of malignant neoplasm of bladder: Secondary | ICD-10-CM | POA: Diagnosis not present

## 2023-01-08 DIAGNOSIS — G4730 Sleep apnea, unspecified: Secondary | ICD-10-CM | POA: Diagnosis not present

## 2023-01-08 DIAGNOSIS — Z9181 History of falling: Secondary | ICD-10-CM | POA: Diagnosis not present

## 2023-01-08 DIAGNOSIS — Z85118 Personal history of other malignant neoplasm of bronchus and lung: Secondary | ICD-10-CM | POA: Diagnosis not present

## 2023-01-14 DIAGNOSIS — Z85841 Personal history of malignant neoplasm of brain: Secondary | ICD-10-CM | POA: Diagnosis not present

## 2023-01-14 DIAGNOSIS — E782 Mixed hyperlipidemia: Secondary | ICD-10-CM | POA: Diagnosis not present

## 2023-01-14 DIAGNOSIS — K219 Gastro-esophageal reflux disease without esophagitis: Secondary | ICD-10-CM | POA: Diagnosis not present

## 2023-01-14 DIAGNOSIS — Z85118 Personal history of other malignant neoplasm of bronchus and lung: Secondary | ICD-10-CM | POA: Diagnosis not present

## 2023-01-14 DIAGNOSIS — I69322 Dysarthria following cerebral infarction: Secondary | ICD-10-CM | POA: Diagnosis not present

## 2023-01-14 DIAGNOSIS — I10 Essential (primary) hypertension: Secondary | ICD-10-CM | POA: Diagnosis not present

## 2023-01-14 DIAGNOSIS — M5441 Lumbago with sciatica, right side: Secondary | ICD-10-CM | POA: Diagnosis not present

## 2023-01-14 DIAGNOSIS — G8929 Other chronic pain: Secondary | ICD-10-CM | POA: Diagnosis not present

## 2023-01-14 DIAGNOSIS — I69354 Hemiplegia and hemiparesis following cerebral infarction affecting left non-dominant side: Secondary | ICD-10-CM | POA: Diagnosis not present

## 2023-01-14 DIAGNOSIS — G4730 Sleep apnea, unspecified: Secondary | ICD-10-CM | POA: Diagnosis not present

## 2023-01-14 DIAGNOSIS — Z87891 Personal history of nicotine dependence: Secondary | ICD-10-CM | POA: Diagnosis not present

## 2023-01-14 DIAGNOSIS — Z9181 History of falling: Secondary | ICD-10-CM | POA: Diagnosis not present

## 2023-01-14 DIAGNOSIS — Z8551 Personal history of malignant neoplasm of bladder: Secondary | ICD-10-CM | POA: Diagnosis not present

## 2023-01-16 ENCOUNTER — Encounter: Payer: Self-pay | Admitting: Family Medicine

## 2023-01-16 ENCOUNTER — Ambulatory Visit (INDEPENDENT_AMBULATORY_CARE_PROVIDER_SITE_OTHER): Payer: Medicare Other | Admitting: Family Medicine

## 2023-01-16 VITALS — BP 106/69 | HR 73 | Temp 98.0°F | Ht 66.0 in | Wt 169.0 lb

## 2023-01-16 DIAGNOSIS — R8290 Unspecified abnormal findings in urine: Secondary | ICD-10-CM

## 2023-01-16 DIAGNOSIS — I69354 Hemiplegia and hemiparesis following cerebral infarction affecting left non-dominant side: Secondary | ICD-10-CM | POA: Diagnosis not present

## 2023-01-16 DIAGNOSIS — Z87891 Personal history of nicotine dependence: Secondary | ICD-10-CM | POA: Diagnosis not present

## 2023-01-16 DIAGNOSIS — R319 Hematuria, unspecified: Secondary | ICD-10-CM

## 2023-01-16 DIAGNOSIS — I10 Essential (primary) hypertension: Secondary | ICD-10-CM | POA: Diagnosis not present

## 2023-01-16 DIAGNOSIS — G4730 Sleep apnea, unspecified: Secondary | ICD-10-CM | POA: Diagnosis not present

## 2023-01-16 DIAGNOSIS — Z8551 Personal history of malignant neoplasm of bladder: Secondary | ICD-10-CM | POA: Diagnosis not present

## 2023-01-16 DIAGNOSIS — G8929 Other chronic pain: Secondary | ICD-10-CM | POA: Diagnosis not present

## 2023-01-16 DIAGNOSIS — N3001 Acute cystitis with hematuria: Secondary | ICD-10-CM

## 2023-01-16 DIAGNOSIS — M5441 Lumbago with sciatica, right side: Secondary | ICD-10-CM | POA: Diagnosis not present

## 2023-01-16 DIAGNOSIS — E782 Mixed hyperlipidemia: Secondary | ICD-10-CM | POA: Diagnosis not present

## 2023-01-16 DIAGNOSIS — K219 Gastro-esophageal reflux disease without esophagitis: Secondary | ICD-10-CM | POA: Diagnosis not present

## 2023-01-16 DIAGNOSIS — Z85841 Personal history of malignant neoplasm of brain: Secondary | ICD-10-CM | POA: Diagnosis not present

## 2023-01-16 DIAGNOSIS — Z85118 Personal history of other malignant neoplasm of bronchus and lung: Secondary | ICD-10-CM | POA: Diagnosis not present

## 2023-01-16 DIAGNOSIS — Z9181 History of falling: Secondary | ICD-10-CM | POA: Diagnosis not present

## 2023-01-16 DIAGNOSIS — I69322 Dysarthria following cerebral infarction: Secondary | ICD-10-CM | POA: Diagnosis not present

## 2023-01-16 LAB — POCT URINALYSIS DIP (CLINITEK)
Bilirubin, UA: NEGATIVE
Glucose, UA: NEGATIVE mg/dL
Ketones, POC UA: NEGATIVE mg/dL
Nitrite, UA: POSITIVE — AB
POC PROTEIN,UA: NEGATIVE
Spec Grav, UA: 1.025 (ref 1.010–1.025)
Urobilinogen, UA: 0.2 E.U./dL
pH, UA: 5.5 (ref 5.0–8.0)

## 2023-01-16 MED ORDER — CEPHALEXIN 500 MG PO CAPS: 500.0000 mg | ORAL_CAPSULE | Freq: Two times a day (BID) | ORAL | 0 refills | Status: DC

## 2023-01-16 MED ORDER — MELOXICAM 7.5 MG PO TABS: 7.5000 mg | ORAL_TABLET | Freq: Every day | ORAL | 1 refills | Status: DC

## 2023-01-16 NOTE — Patient Instructions (Signed)
Start antibiotic for UTI.  Stay well hydrated.  Let us know if symptoms are not improving.

## 2023-01-16 NOTE — Progress Notes (Signed)
Jessica Ritter - 62 y.o. female MRN [id number]  Date of birth: 1961/06/05  Subjective Chief Complaint  Patient presents with   Urinary Tract Infection    HPI Jessica Ritter is a 62 y.o. female here today with her husband.  Her husband has noted changes to her mental status, noting that she is a little more confused than usual.   Some urgency.  Denies frequency or pain. Has noted some blood in the urine as well.  No fever or chills.  Has had some generalized weakness.   ROS:  A comprehensive ROS was completed and negative except as noted per HPI  Allergies  Allergen Reactions   Anticoagulant Compound Other (See Comments)    Subdural Hematoma   Wasp Venom Protein Swelling   Bee Venom Swelling   Warfarin Other (See Comments)    Amyloid angiopathy.  Hih risk of fatal ICH on treatment dose anticoagulants.  Antiplatelet therapy and DVT prophylaxis is okay.   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   Gait disturbance, post-stroke 04/03/2018   History of lower GI bleeding 05/2018   History of malignant neoplasm metastatic to lung 2002   History of malignant neoplasm of brain 2003   secondary, renal   History of malignant neoplasm of renal pelvis 1998   History of multiple pulmonary nodules 2002   Hyperlipidemia    Hypertension    Seizures ([place]) 2016   tapered off of Keppra 06/2018   Sleep apnea    Subdural hematoma caused by concussion ([place]) 04/2014    Past Surgical History:  Procedure Laterality Date   BRAIN SURGERY     brain tumor surgery     COLONOSCOPY W/ POLYPECTOMY  05/2018   KIDNEY SURGERY     LEG SURGERY     LUNG LOBECTOMY Right 2002   RLL    Social History   Socioeconomic History    Marital status: Married    Spouse name: Not on file   Number of children: Not on file   Years of education: Not on file   Highest education level: Not on file  Occupational History   Not on file  Tobacco Use   Smoking status: Former    Types: Cigarettes    Quit date: 07/21/1997    Years since quitting: 25.5   Smokeless tobacco: Never  Substance and Sexual Activity   Alcohol use: Yes    Alcohol/week: 4.0 standard drinks of alcohol    Types: 2 Glasses of wine, 2 Standard drinks or equivalent per week   Drug use: No   Sexual activity: Not Currently    Partners: Male  Other Topics Concern   Not on file  Social History Narrative   Not on file   Social Determinants of Health   Financial Resource Strain: Not on file  Food Insecurity: Not on file  Transportation Needs: Not on file  Physical Activity: Not on file  Stress: Not on file  Social Connections: Not on file    Family History  Problem Relation Age of Onset   Cancer Other        mother     Health Maintenance  Topic Date Due   INFLUENZA VACCINE  01/19/2023 (Originally 05/21/2022)   MAMMOGRAM  02/19/2023 (Originally 07/27/2021)   Hepatitis C Screening  02/19/2023 (Originally 09/30/1980)   PAP SMEAR-Modifier  07/19/2023 (Originally 10/01/2022)   COVID-19 Vaccine (3 - Pfizer risk series) 07/19/2023 (Originally 02/28/2020)   Medicare Annual Wellness (AWV)  07/19/2023 (Originally 08-17-1961)   Zoster Vaccines- Shingrix (1 of 2) 07/19/2023 (Originally 09/30/1981)   COLONOSCOPY (Pts 45-75yrs Insurance coverage will need to be confirmed)  06/19/2023   DTaP/Tdap/Td (2 - Td or Tdap) 07/03/2027   HPV VACCINES  Aged Out   HIV Screening  Discontinued     ----------------------------------------------------------------------------------------------------------------------------------------------------------------------------------------------------------------- Physical Exam BP 106/69 (BP Location: Left Arm, Patient Position:  Sitting, Cuff Size: Normal)   Pulse 73   Temp 98 F (36.7 C) (Oral)   Ht 5\' 6"  (1.676 m)   Wt 169 lb (76.7 kg)   SpO2 98%   BMI 27.28 kg/m   Physical Exam Constitutional:      Appearance: Normal appearance.  HENT:     Head: Normocephalic and atraumatic.  Abdominal:     Tenderness: There is no right CVA tenderness or left CVA tenderness.  Neurological:     Mental Status: She is alert.  Psychiatric:        Mood and Affect: Mood normal.        Behavior: Behavior normal.     ------------------------------------------------------------------------------------------------------------------------------------------------------------------------------------------------------------------- Assessment and Plan  Acute cystitis with hematuria Start cephalexin Encouraged hydration.  Urine sent for culture and will adjust antibiotic as needed based on culture results.    Meds ordered this encounter  Medications   cephALEXin (KEFLEX) 500 MG capsule    Sig: Take 1 capsule (500 mg total) by mouth 2 (two) times daily for 7 days.    Dispense:  14 capsule    Refill:  0   meloxicam (MOBIC) 7.5 MG tablet    Sig: Take 1 tablet (7.5 mg total) by mouth daily.    Dispense:  30 tablet    Refill:  1    No follow-ups on file.    This visit occurred during the SARS-CoV-2 public health emergency.  Safety protocols were in place, including screening questions prior to the visit, additional usage of staff PPE, and extensive cleaning of exam room while observing appropriate contact time as indicated for disinfecting solutions.

## 2023-01-16 NOTE — Assessment & Plan Note (Signed)
Start cephalexin Encouraged hydration.  Urine sent for culture and will adjust antibiotic as needed based on culture results.

## 2023-01-18 LAB — URINE CULTURE
MICRO NUMBER:: 14755133
SPECIMEN QUALITY:: ADEQUATE

## 2023-01-22 ENCOUNTER — Encounter: Payer: Self-pay | Admitting: Family Medicine

## 2023-01-23 DIAGNOSIS — Z9181 History of falling: Secondary | ICD-10-CM | POA: Diagnosis not present

## 2023-01-23 DIAGNOSIS — I10 Essential (primary) hypertension: Secondary | ICD-10-CM | POA: Diagnosis not present

## 2023-01-23 DIAGNOSIS — K219 Gastro-esophageal reflux disease without esophagitis: Secondary | ICD-10-CM | POA: Diagnosis not present

## 2023-01-23 DIAGNOSIS — Z8551 Personal history of malignant neoplasm of bladder: Secondary | ICD-10-CM | POA: Diagnosis not present

## 2023-01-23 DIAGNOSIS — I69354 Hemiplegia and hemiparesis following cerebral infarction affecting left non-dominant side: Secondary | ICD-10-CM | POA: Diagnosis not present

## 2023-01-23 DIAGNOSIS — M5441 Lumbago with sciatica, right side: Secondary | ICD-10-CM | POA: Diagnosis not present

## 2023-01-23 DIAGNOSIS — G8929 Other chronic pain: Secondary | ICD-10-CM | POA: Diagnosis not present

## 2023-01-23 DIAGNOSIS — Z87891 Personal history of nicotine dependence: Secondary | ICD-10-CM | POA: Diagnosis not present

## 2023-01-23 DIAGNOSIS — G4730 Sleep apnea, unspecified: Secondary | ICD-10-CM | POA: Diagnosis not present

## 2023-01-23 DIAGNOSIS — Z85118 Personal history of other malignant neoplasm of bronchus and lung: Secondary | ICD-10-CM | POA: Diagnosis not present

## 2023-01-23 DIAGNOSIS — Z85841 Personal history of malignant neoplasm of brain: Secondary | ICD-10-CM | POA: Diagnosis not present

## 2023-01-23 DIAGNOSIS — I69322 Dysarthria following cerebral infarction: Secondary | ICD-10-CM | POA: Diagnosis not present

## 2023-01-23 DIAGNOSIS — E782 Mixed hyperlipidemia: Secondary | ICD-10-CM | POA: Diagnosis not present

## 2023-01-24 MED ORDER — CEPHALEXIN 500 MG PO CAPS: 500.0000 mg | ORAL_CAPSULE | Freq: Two times a day (BID) | ORAL | 0 refills | Status: DC

## 2023-01-27 DIAGNOSIS — I69322 Dysarthria following cerebral infarction: Secondary | ICD-10-CM | POA: Diagnosis not present

## 2023-01-27 DIAGNOSIS — G4730 Sleep apnea, unspecified: Secondary | ICD-10-CM | POA: Diagnosis not present

## 2023-01-27 DIAGNOSIS — Z87891 Personal history of nicotine dependence: Secondary | ICD-10-CM | POA: Diagnosis not present

## 2023-01-27 DIAGNOSIS — K219 Gastro-esophageal reflux disease without esophagitis: Secondary | ICD-10-CM | POA: Diagnosis not present

## 2023-01-27 DIAGNOSIS — G8929 Other chronic pain: Secondary | ICD-10-CM | POA: Diagnosis not present

## 2023-01-27 DIAGNOSIS — Z85118 Personal history of other malignant neoplasm of bronchus and lung: Secondary | ICD-10-CM | POA: Diagnosis not present

## 2023-01-27 DIAGNOSIS — Z9181 History of falling: Secondary | ICD-10-CM | POA: Diagnosis not present

## 2023-01-27 DIAGNOSIS — Z85841 Personal history of malignant neoplasm of brain: Secondary | ICD-10-CM | POA: Diagnosis not present

## 2023-01-27 DIAGNOSIS — Z8551 Personal history of malignant neoplasm of bladder: Secondary | ICD-10-CM | POA: Diagnosis not present

## 2023-01-27 DIAGNOSIS — I69354 Hemiplegia and hemiparesis following cerebral infarction affecting left non-dominant side: Secondary | ICD-10-CM | POA: Diagnosis not present

## 2023-01-27 DIAGNOSIS — I10 Essential (primary) hypertension: Secondary | ICD-10-CM | POA: Diagnosis not present

## 2023-01-27 DIAGNOSIS — M5441 Lumbago with sciatica, right side: Secondary | ICD-10-CM | POA: Diagnosis not present

## 2023-01-27 DIAGNOSIS — E782 Mixed hyperlipidemia: Secondary | ICD-10-CM | POA: Diagnosis not present

## 2023-01-30 ENCOUNTER — Encounter: Payer: Self-pay | Admitting: Family Medicine

## 2023-01-30 ENCOUNTER — Other Ambulatory Visit (INDEPENDENT_AMBULATORY_CARE_PROVIDER_SITE_OTHER): Payer: Medicare Other

## 2023-01-30 ENCOUNTER — Ambulatory Visit (INDEPENDENT_AMBULATORY_CARE_PROVIDER_SITE_OTHER): Payer: Medicare Other | Admitting: Family Medicine

## 2023-01-30 VITALS — BP 121/79 | HR 70 | Ht 66.0 in | Wt 169.0 lb

## 2023-01-30 DIAGNOSIS — R319 Hematuria, unspecified: Secondary | ICD-10-CM | POA: Diagnosis not present

## 2023-01-30 DIAGNOSIS — R8290 Unspecified abnormal findings in urine: Secondary | ICD-10-CM

## 2023-01-30 DIAGNOSIS — N3001 Acute cystitis with hematuria: Secondary | ICD-10-CM

## 2023-01-30 LAB — POCT URINALYSIS DIP (CLINITEK)
Glucose, UA: NEGATIVE mg/dL
Ketones, POC UA: NEGATIVE mg/dL
Nitrite, UA: NEGATIVE
POC PROTEIN,UA: 100 — AB
Spec Grav, UA: 1.015 (ref 1.010–1.025)
Urobilinogen, UA: 0.2 E.U./dL
pH, UA: 5.5 (ref 5.0–8.0)

## 2023-01-30 MED ORDER — NITROFURANTOIN MONOHYD MACRO 100 MG PO CAPS: 100.0000 mg | ORAL_CAPSULE | Freq: Two times a day (BID) | ORAL | 0 refills | Status: DC

## 2023-01-30 NOTE — Progress Notes (Signed)
Jessica Ritter - 62 y.o. female MRN [id number]  Date of birth: [birth year]  Subjective Chief Complaint  Patient presents with   Hematuria    HPI Jessica Ritter is a 62 y.o. female here today with complaint of hematuria.  She was recently seen and treated for UTI with Keflex  She has 1 additional day of this however woke up with hematuria this morning.  She does not have any increased pain with urination, frequency or urgency.  She has not had fever or chills.  Denies flank pain.  ROS:  A comprehensive ROS was completed and negative except as noted per HPI  Allergies  Allergen Reactions   Anticoagulant Compound Other (See Comments)    Subdural Hematoma   Wasp Venom Protein Swelling   Bee Venom Swelling   Warfarin Other (See Comments)    Amyloid angiopathy.  Hih risk of fatal ICH on treatment dose anticoagulants.  Antiplatelet therapy and DVT prophylaxis is okay.   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   Gait disturbance, post-stroke 04/03/2018   History of lower GI bleeding 05/2018   History of malignant neoplasm metastatic to lung 2002   History of malignant neoplasm of brain 2003   secondary, renal   History of malignant neoplasm of renal pelvis 1998   History of multiple pulmonary nodules 2002   Hyperlipidemia    Hypertension    Seizures 2016   tapered off of Keppra 06/2018   Sleep apnea    Subdural hematoma caused by concussion 04/2014    Past Surgical History:  Procedure Laterality Date   BRAIN SURGERY     brain tumor surgery     COLONOSCOPY W/ POLYPECTOMY  05/2018   KIDNEY SURGERY     LEG SURGERY     LUNG LOBECTOMY Right 2002   RLL    Social History   Socioeconomic History   Marital  status: Married    Spouse name: Not on file   Number of children: Not on file   Years of education: Not on file   Highest education level: Not on file  Occupational History   Not on file  Tobacco Use   Smoking status: Former    Types: Cigarettes    Quit date: 07/21/1997    Years since quitting: 25.5   Smokeless tobacco: Never  Substance and Sexual Activity   Alcohol use: Yes    Alcohol/week: 4.0 standard drinks of alcohol    Types: 2 Glasses of wine, 2 Standard drinks or equivalent per week   Drug use: No   Sexual activity: Not Currently    Partners: Male  Other Topics Concern   Not on file  Social History Narrative   Not on file   Social Determinants of Health   Financial Resource Strain: Not on file  Food Insecurity: Not on file  Transportation Needs: Not on file  Physical Activity: Not on file  Stress: Not on file  Social Connections: Not on file    Family History  Problem Relation Age of Onset   Cancer Other        mother     Health Maintenance  Topic Date Due   MAMMOGRAM  02/19/2023 (Originally 07/27/2021)  Hepatitis C Screening  02/19/2023 (Originally 09/30/1980)   PAP SMEAR-Modifier  07/19/2023 (Originally 10/01/2022)   COVID-19 Vaccine (3 - Pfizer risk series) 07/19/2023 (Originally 02/28/2020)   Medicare Annual Wellness (AWV)  07/19/2023 (Originally 1961-06-26)   Zoster Vaccines- Shingrix (1 of 2) 07/19/2023 (Originally 09/30/1981)   INFLUENZA VACCINE  05/22/2023   COLONOSCOPY (Pts 45-5yrs Insurance coverage will need to be confirmed)  06/19/2023   DTaP/Tdap/Td (2 - Td or Tdap) 07/03/2027   HPV VACCINES  Aged Out   HIV Screening  Discontinued     ----------------------------------------------------------------------------------------------------------------------------------------------------------------------------------------------------------------- Physical Exam BP 121/79 (BP Location: Left Arm, Patient Position: Sitting, Cuff Size: Normal)    Pulse 70   Ht 5\' 6"  (1.676 m)   Wt 169 lb (76.7 kg)   SpO2 96%   BMI 27.28 kg/m   Physical Exam Constitutional:      Appearance: Normal appearance.  HENT:     Head: Normocephalic and atraumatic.  Abdominal:     Tenderness: There is no right CVA tenderness or left CVA tenderness.  Neurological:     Mental Status: She is alert.  Psychiatric:        Mood and Affect: Mood normal.        Behavior: Behavior normal.     ------------------------------------------------------------------------------------------------------------------------------------------------------------------------------------------------------------------- Assessment and Plan  Acute cystitis with hematuria Blood and leukocytes along with some protein noted on urinalysis.  Sending for culture.  Will hold off on adding any additional medication at this time until culture returns.  However I am sending over prescription for Macrobid since they are traveling out of town and discussed that if she develops new symptoms I would recommend that she go ahead and start this.   Meds ordered this encounter  Medications   nitrofurantoin, macrocrystal-monohydrate, (MACROBID) 100 MG capsule    Sig: Take 1 capsule (100 mg total) by mouth 2 (two) times daily.    Dispense:  14 capsule    Refill:  0    No follow-ups on file.    This visit occurred during the SARS-CoV-2 public health emergency.  Safety protocols were in place, including screening questions prior to the visit, additional usage of staff PPE, and extensive cleaning of exam room while observing appropriate contact time as indicated for disinfecting solutions.

## 2023-01-30 NOTE — Assessment & Plan Note (Signed)
Blood and leukocytes along with some protein noted on urinalysis.  Sending for culture.  Will hold off on adding any additional medication at this time until culture returns.  However I am sending over prescription for Macrobid since they are traveling out of town and discussed that if she develops new symptoms I would recommend that she go ahead and start this.

## 2023-01-31 LAB — URINE CULTURE
MICRO NUMBER:: 14812541
Result:: NO GROWTH
SPECIMEN QUALITY:: ADEQUATE

## 2023-02-04 ENCOUNTER — Encounter: Payer: Self-pay | Admitting: Family Medicine

## 2023-02-04 DIAGNOSIS — R310 Gross hematuria: Secondary | ICD-10-CM

## 2023-02-04 NOTE — Telephone Encounter (Signed)
Orders entered for imaging and referral placed to urology.   CM

## 2023-02-06 DIAGNOSIS — I69322 Dysarthria following cerebral infarction: Secondary | ICD-10-CM | POA: Diagnosis not present

## 2023-02-06 DIAGNOSIS — Z85841 Personal history of malignant neoplasm of brain: Secondary | ICD-10-CM | POA: Diagnosis not present

## 2023-02-06 DIAGNOSIS — Z87891 Personal history of nicotine dependence: Secondary | ICD-10-CM | POA: Diagnosis not present

## 2023-02-06 DIAGNOSIS — M5441 Lumbago with sciatica, right side: Secondary | ICD-10-CM | POA: Diagnosis not present

## 2023-02-06 DIAGNOSIS — E782 Mixed hyperlipidemia: Secondary | ICD-10-CM | POA: Diagnosis not present

## 2023-02-06 DIAGNOSIS — I69354 Hemiplegia and hemiparesis following cerebral infarction affecting left non-dominant side: Secondary | ICD-10-CM | POA: Diagnosis not present

## 2023-02-06 DIAGNOSIS — I10 Essential (primary) hypertension: Secondary | ICD-10-CM | POA: Diagnosis not present

## 2023-02-06 DIAGNOSIS — Z8551 Personal history of malignant neoplasm of bladder: Secondary | ICD-10-CM | POA: Diagnosis not present

## 2023-02-06 DIAGNOSIS — Z9181 History of falling: Secondary | ICD-10-CM | POA: Diagnosis not present

## 2023-02-06 DIAGNOSIS — G4730 Sleep apnea, unspecified: Secondary | ICD-10-CM | POA: Diagnosis not present

## 2023-02-06 DIAGNOSIS — G8929 Other chronic pain: Secondary | ICD-10-CM | POA: Diagnosis not present

## 2023-02-06 DIAGNOSIS — K219 Gastro-esophageal reflux disease without esophagitis: Secondary | ICD-10-CM | POA: Diagnosis not present

## 2023-02-06 DIAGNOSIS — Z85118 Personal history of other malignant neoplasm of bronchus and lung: Secondary | ICD-10-CM | POA: Diagnosis not present

## 2023-02-10 ENCOUNTER — Ambulatory Visit (INDEPENDENT_AMBULATORY_CARE_PROVIDER_SITE_OTHER): Payer: Medicare Other

## 2023-02-10 DIAGNOSIS — N281 Cyst of kidney, acquired: Secondary | ICD-10-CM | POA: Diagnosis not present

## 2023-02-10 DIAGNOSIS — R310 Gross hematuria: Secondary | ICD-10-CM | POA: Diagnosis not present

## 2023-02-10 DIAGNOSIS — N1330 Unspecified hydronephrosis: Secondary | ICD-10-CM | POA: Diagnosis not present

## 2023-02-10 MED ORDER — IOHEXOL 300 MG/ML  SOLN
200.0000 mL | Freq: Once | INTRAMUSCULAR | Status: AC | PRN
  Administered 2023-02-10: 125 mL via INTRAVENOUS

## 2023-02-11 ENCOUNTER — Other Ambulatory Visit: Payer: Medicare Other

## 2023-02-12 ENCOUNTER — Observation Stay (HOSPITAL_COMMUNITY): Payer: Medicare Other | Admitting: Anesthesiology

## 2023-02-12 ENCOUNTER — Ambulatory Visit: Payer: Self-pay | Admitting: Urology

## 2023-02-12 ENCOUNTER — Inpatient Hospital Stay (HOSPITAL_BASED_OUTPATIENT_CLINIC_OR_DEPARTMENT_OTHER)
Admission: EM | Admit: 2023-02-12 | Discharge: 2023-02-15 | DRG: 660 | Disposition: A | Discharge status: Home / Self Care | Payer: Medicare Other | Attending: Family Medicine | Admitting: Family Medicine

## 2023-02-12 ENCOUNTER — Observation Stay (HOSPITAL_COMMUNITY): Payer: Medicare Other

## 2023-02-12 ENCOUNTER — Observation Stay (HOSPITAL_BASED_OUTPATIENT_CLINIC_OR_DEPARTMENT_OTHER): Payer: Medicare Other | Admitting: Anesthesiology

## 2023-02-12 ENCOUNTER — Encounter: Payer: Self-pay | Admitting: Family Medicine

## 2023-02-12 ENCOUNTER — Encounter (HOSPITAL_BASED_OUTPATIENT_CLINIC_OR_DEPARTMENT_OTHER): Payer: Self-pay

## 2023-02-12 ENCOUNTER — Encounter (HOSPITAL_COMMUNITY)
Admission: EM | Disposition: A | Discharge status: Home / Self Care | Payer: Self-pay | Source: Home / Self Care | Attending: Family Medicine

## 2023-02-12 ENCOUNTER — Other Ambulatory Visit: Payer: Self-pay

## 2023-02-12 DIAGNOSIS — Z87891 Personal history of nicotine dependence: Secondary | ICD-10-CM | POA: Diagnosis not present

## 2023-02-12 DIAGNOSIS — I10 Essential (primary) hypertension: Secondary | ICD-10-CM | POA: Diagnosis present

## 2023-02-12 DIAGNOSIS — R531 Weakness: Secondary | ICD-10-CM | POA: Diagnosis not present

## 2023-02-12 DIAGNOSIS — Z85118 Personal history of other malignant neoplasm of bronchus and lung: Secondary | ICD-10-CM | POA: Diagnosis not present

## 2023-02-12 DIAGNOSIS — E854 Organ-limited amyloidosis: Secondary | ICD-10-CM | POA: Diagnosis present

## 2023-02-12 DIAGNOSIS — K219 Gastro-esophageal reflux disease without esophagitis: Secondary | ICD-10-CM | POA: Diagnosis not present

## 2023-02-12 DIAGNOSIS — E785 Hyperlipidemia, unspecified: Secondary | ICD-10-CM | POA: Diagnosis present

## 2023-02-12 DIAGNOSIS — D649 Anemia, unspecified: Secondary | ICD-10-CM | POA: Diagnosis not present

## 2023-02-12 DIAGNOSIS — E663 Overweight: Secondary | ICD-10-CM | POA: Diagnosis present

## 2023-02-12 DIAGNOSIS — N2889 Other specified disorders of kidney and ureter: Secondary | ICD-10-CM | POA: Diagnosis not present

## 2023-02-12 DIAGNOSIS — Z8553 Personal history of malignant neoplasm of renal pelvis: Secondary | ICD-10-CM

## 2023-02-12 DIAGNOSIS — G4730 Sleep apnea, unspecified: Secondary | ICD-10-CM | POA: Diagnosis present

## 2023-02-12 DIAGNOSIS — I680 Cerebral amyloid angiopathy: Secondary | ICD-10-CM | POA: Diagnosis not present

## 2023-02-12 DIAGNOSIS — Z91030 Bee allergy status: Secondary | ICD-10-CM | POA: Diagnosis not present

## 2023-02-12 DIAGNOSIS — N139 Obstructive and reflux uropathy, unspecified: Secondary | ICD-10-CM | POA: Diagnosis not present

## 2023-02-12 DIAGNOSIS — R059 Cough, unspecified: Secondary | ICD-10-CM | POA: Diagnosis present

## 2023-02-12 DIAGNOSIS — Z881 Allergy status to other antibiotic agents status: Secondary | ICD-10-CM

## 2023-02-12 DIAGNOSIS — Q600 Renal agenesis, unilateral: Secondary | ICD-10-CM

## 2023-02-12 DIAGNOSIS — Z79899 Other long term (current) drug therapy: Secondary | ICD-10-CM | POA: Diagnosis not present

## 2023-02-12 DIAGNOSIS — G8929 Other chronic pain: Secondary | ICD-10-CM | POA: Diagnosis present

## 2023-02-12 DIAGNOSIS — N1411 Contrast-induced nephropathy: Secondary | ICD-10-CM | POA: Diagnosis present

## 2023-02-12 DIAGNOSIS — R310 Gross hematuria: Secondary | ICD-10-CM | POA: Diagnosis present

## 2023-02-12 DIAGNOSIS — Z6826 Body mass index (BMI) 26.0-26.9, adult: Secondary | ICD-10-CM

## 2023-02-12 DIAGNOSIS — N179 Acute kidney failure, unspecified: Secondary | ICD-10-CM | POA: Diagnosis not present

## 2023-02-12 DIAGNOSIS — Z8551 Personal history of malignant neoplasm of bladder: Secondary | ICD-10-CM | POA: Diagnosis not present

## 2023-02-12 DIAGNOSIS — T508X5A Adverse effect of diagnostic agents, initial encounter: Secondary | ICD-10-CM | POA: Diagnosis present

## 2023-02-12 DIAGNOSIS — I69354 Hemiplegia and hemiparesis following cerebral infarction affecting left non-dominant side: Secondary | ICD-10-CM | POA: Diagnosis not present

## 2023-02-12 DIAGNOSIS — Z85841 Personal history of malignant neoplasm of brain: Secondary | ICD-10-CM | POA: Diagnosis not present

## 2023-02-12 DIAGNOSIS — Z87440 Personal history of urinary (tract) infections: Secondary | ICD-10-CM

## 2023-02-12 DIAGNOSIS — N131 Hydronephrosis with ureteral stricture, not elsewhere classified: Secondary | ICD-10-CM

## 2023-02-12 DIAGNOSIS — Z905 Acquired absence of kidney: Secondary | ICD-10-CM | POA: Diagnosis not present

## 2023-02-12 DIAGNOSIS — R1310 Dysphagia, unspecified: Secondary | ICD-10-CM | POA: Diagnosis present

## 2023-02-12 DIAGNOSIS — I5189 Other ill-defined heart diseases: Secondary | ICD-10-CM

## 2023-02-12 DIAGNOSIS — Z888 Allergy status to other drugs, medicaments and biological substances status: Secondary | ICD-10-CM

## 2023-02-12 DIAGNOSIS — Z7982 Long term (current) use of aspirin: Secondary | ICD-10-CM

## 2023-02-12 DIAGNOSIS — N138 Other obstructive and reflux uropathy: Secondary | ICD-10-CM | POA: Diagnosis not present

## 2023-02-12 DIAGNOSIS — I6930 Unspecified sequelae of cerebral infarction: Secondary | ICD-10-CM

## 2023-02-12 DIAGNOSIS — Z902 Acquired absence of lung [part of]: Secondary | ICD-10-CM

## 2023-02-12 DIAGNOSIS — D4959 Neoplasm of unspecified behavior of other genitourinary organ: Secondary | ICD-10-CM

## 2023-02-12 DIAGNOSIS — R34 Anuria and oliguria: Secondary | ICD-10-CM | POA: Diagnosis present

## 2023-02-12 DIAGNOSIS — N135 Crossing vessel and stricture of ureter without hydronephrosis: Secondary | ICD-10-CM | POA: Diagnosis not present

## 2023-02-12 DIAGNOSIS — Z85520 Personal history of malignant carcinoid tumor of kidney: Secondary | ICD-10-CM | POA: Diagnosis not present

## 2023-02-12 DIAGNOSIS — D630 Anemia in neoplastic disease: Secondary | ICD-10-CM | POA: Diagnosis not present

## 2023-02-12 DIAGNOSIS — N1330 Unspecified hydronephrosis: Secondary | ICD-10-CM | POA: Diagnosis not present

## 2023-02-12 DIAGNOSIS — N3289 Other specified disorders of bladder: Secondary | ICD-10-CM | POA: Diagnosis present

## 2023-02-12 HISTORY — PX: CYSTOSCOPY WITH RETROGRADE PYELOGRAM, URETEROSCOPY AND STENT PLACEMENT: SHX5789

## 2023-02-12 LAB — URINALYSIS, ROUTINE W REFLEX MICROSCOPIC
Bilirubin Urine: NEGATIVE
Glucose, UA: NEGATIVE mg/dL
Ketones, ur: NEGATIVE mg/dL
Nitrite: NEGATIVE
Protein, ur: 30 mg/dL — AB
RBC / HPF: >50 RBC/hpf (ref 0–5)
Specific Gravity, Urine: 1.012 (ref 1.005–1.030)
pH: 7 (ref 5.0–8.0)

## 2023-02-12 LAB — BASIC METABOLIC PANEL
Anion gap: 13 (ref 5–15)
BUN: 88 mg/dL — ABNORMAL HIGH (ref 6–20)
CO2: 22 mmol/L (ref 22–32)
Calcium: 9.3 mg/dL (ref 8.9–10.3)
Chloride: 98 mmol/L (ref 98–111)
Creatinine, Ser: 6.25 mg/dL — ABNORMAL HIGH (ref 0.44–1.00)
GFR, Estimated: 7 mL/min — ABNORMAL LOW (ref >60)
Glucose, Bld: 90 mg/dL (ref 70–99)
Potassium: 4.5 mmol/L (ref 3.5–5.1)
Sodium: 133 mmol/L — ABNORMAL LOW (ref 135–145)

## 2023-02-12 LAB — COMPREHENSIVE METABOLIC PANEL
ALT: 17 U/L (ref 0–44)
AST: 22 U/L (ref 15–41)
Albumin: 3.5 g/dL (ref 3.5–5.0)
Alkaline Phosphatase: 64 U/L (ref 38–126)
Anion gap: 13 (ref 5–15)
BUN: 85 mg/dL — ABNORMAL HIGH (ref 6–20)
CO2: 22 mmol/L (ref 22–32)
Calcium: 9.4 mg/dL (ref 8.9–10.3)
Chloride: 99 mmol/L (ref 98–111)
Creatinine, Ser: 5.85 mg/dL — ABNORMAL HIGH (ref 0.44–1.00)
GFR, Estimated: 8 mL/min — ABNORMAL LOW (ref >60)
Glucose, Bld: 113 mg/dL — ABNORMAL HIGH (ref 70–99)
Potassium: 4.1 mmol/L (ref 3.5–5.1)
Sodium: 134 mmol/L — ABNORMAL LOW (ref 135–145)
Total Bilirubin: 0.8 mg/dL (ref 0.3–1.2)
Total Protein: 6.5 g/dL (ref 6.5–8.1)

## 2023-02-12 LAB — CBC WITH DIFFERENTIAL/PLATELET
Abs Immature Granulocytes: 0.02 10*3/uL (ref 0.00–0.07)
Basophils Absolute: 0.1 10*3/uL (ref 0.0–0.1)
Basophils Relative: 1 %
Eosinophils Absolute: 0.4 10*3/uL (ref 0.0–0.5)
Eosinophils Relative: 5 %
HCT: 33.2 % — ABNORMAL LOW (ref 36.0–46.0)
Hemoglobin: 11.2 g/dL — ABNORMAL LOW (ref 12.0–15.0)
Immature Granulocytes: 0 %
Lymphocytes Relative: 23 %
Lymphs Abs: 1.7 10*3/uL (ref 0.7–4.0)
MCH: 29.4 pg (ref 26.0–34.0)
MCHC: 33.7 g/dL (ref 30.0–36.0)
MCV: 87.1 fL (ref 80.0–100.0)
Monocytes Absolute: 0.5 10*3/uL (ref 0.1–1.0)
Monocytes Relative: 7 %
Neutro Abs: 4.8 10*3/uL (ref 1.7–7.7)
Neutrophils Relative %: 64 %
Platelets: 185 10*3/uL (ref 150–400)
RBC: 3.81 MIL/uL — ABNORMAL LOW (ref 3.87–5.11)
RDW: 13.3 % (ref 11.5–15.5)
WBC: 7.4 10*3/uL (ref 4.0–10.5)
nRBC: 0 % (ref 0.0–0.2)

## 2023-02-12 LAB — SURGICAL PCR SCREEN
MRSA, PCR: NEGATIVE
Staphylococcus aureus: NEGATIVE

## 2023-02-12 LAB — HIV ANTIBODY (ROUTINE TESTING W REFLEX): HIV Screen 4th Generation wRfx: NONREACTIVE

## 2023-02-12 SURGERY — CYSTOURETEROSCOPY, WITH RETROGRADE PYELOGRAM AND STENT INSERTION
Anesthesia: General | Laterality: Left

## 2023-02-12 MED ORDER — FENTANYL CITRATE PF 50 MCG/ML IJ SOSY: 25.0000 ug | PREFILLED_SYRINGE | INTRAMUSCULAR | Status: DC | PRN

## 2023-02-12 MED ORDER — CEFAZOLIN SODIUM-DEXTROSE 2-4 GM/100ML-% IV SOLN
INTRAVENOUS | Status: AC
  Filled 2023-02-12: qty 100

## 2023-02-12 MED ORDER — ACETAMINOPHEN 10 MG/ML IV SOLN
INTRAVENOUS | Status: DC | PRN
  Administered 2023-02-12: 1000 mg via INTRAVENOUS

## 2023-02-12 MED ORDER — DEXAMETHASONE SODIUM PHOSPHATE 10 MG/ML IJ SOLN
INTRAMUSCULAR | Status: DC | PRN
  Administered 2023-02-12: 10 mg via INTRAVENOUS

## 2023-02-12 MED ORDER — ORAL CARE MOUTH RINSE: 15.0000 mL | Freq: Once | OROMUCOSAL | Status: AC

## 2023-02-12 MED ORDER — ACETAMINOPHEN 325 MG PO TABS: 650.0000 mg | ORAL_TABLET | Freq: Four times a day (QID) | ORAL | Status: DC | PRN

## 2023-02-12 MED ORDER — LIDOCAINE HCL (PF) 2 % IJ SOLN
INTRAMUSCULAR | Status: AC
  Filled 2023-02-12: qty 5

## 2023-02-12 MED ORDER — CEFAZOLIN SODIUM-DEXTROSE 2-4 GM/100ML-% IV SOLN
2.0000 g | INTRAVENOUS | Status: AC
  Administered 2023-02-12: 2 g via INTRAVENOUS

## 2023-02-12 MED ORDER — SODIUM CHLORIDE 0.9 % IV SOLN
INTRAVENOUS | Status: DC
  Administered 2023-02-15: 75 mL/h via INTRAVENOUS

## 2023-02-12 MED ORDER — ONDANSETRON HCL 4 MG/2ML IJ SOLN
INTRAMUSCULAR | Status: DC | PRN
  Administered 2023-02-12: 4 mg via INTRAVENOUS

## 2023-02-12 MED ORDER — PROPOFOL 10 MG/ML IV BOLUS
INTRAVENOUS | Status: DC | PRN
  Administered 2023-02-12: 170 mg via INTRAVENOUS

## 2023-02-12 MED ORDER — IOHEXOL 300 MG/ML  SOLN
INTRAMUSCULAR | Status: DC | PRN
  Administered 2023-02-12: 8 mL

## 2023-02-12 MED ORDER — SODIUM CHLORIDE 0.9 % IV BOLUS: 250.0000 mL | Freq: Once | INTRAVENOUS | Status: DC

## 2023-02-12 MED ORDER — ONDANSETRON HCL 4 MG/2ML IJ SOLN: 4.0000 mg | Freq: Four times a day (QID) | INTRAMUSCULAR | Status: DC | PRN

## 2023-02-12 MED ORDER — DEXAMETHASONE SODIUM PHOSPHATE 10 MG/ML IJ SOLN
INTRAMUSCULAR | Status: AC
  Filled 2023-02-12: qty 1

## 2023-02-12 MED ORDER — FENTANYL CITRATE (PF) 250 MCG/5ML IJ SOLN
INTRAMUSCULAR | Status: DC | PRN
  Administered 2023-02-12: 50 ug via INTRAVENOUS
  Administered 2023-02-12 (×2): 25 ug via INTRAVENOUS

## 2023-02-12 MED ORDER — MORPHINE SULFATE (PF) 2 MG/ML IV SOLN: 1.0000 mg | INTRAVENOUS | Status: DC | PRN

## 2023-02-12 MED ORDER — CHLORHEXIDINE GLUCONATE 0.12 % MT SOLN
15.0000 mL | Freq: Once | OROMUCOSAL | Status: AC
  Administered 2023-02-12: 15 mL via OROMUCOSAL

## 2023-02-12 MED ORDER — ONDANSETRON HCL 4 MG/2ML IJ SOLN
INTRAMUSCULAR | Status: AC
  Filled 2023-02-12: qty 2

## 2023-02-12 MED ORDER — PROMETHAZINE HCL 25 MG/ML IJ SOLN: 6.2500 mg | INTRAMUSCULAR | Status: DC | PRN

## 2023-02-12 MED ORDER — TRAMADOL HCL 50 MG PO TABS: 50.0000 mg | ORAL_TABLET | Freq: Four times a day (QID) | ORAL | Status: DC | PRN

## 2023-02-12 MED ORDER — FENTANYL CITRATE (PF) 100 MCG/2ML IJ SOLN
INTRAMUSCULAR | Status: AC
  Filled 2023-02-12: qty 2

## 2023-02-12 MED ORDER — ACETAMINOPHEN 10 MG/ML IV SOLN
INTRAVENOUS | Status: AC
  Filled 2023-02-12: qty 100

## 2023-02-12 MED ORDER — SENNOSIDES-DOCUSATE SODIUM 8.6-50 MG PO TABS: 1.0000 | ORAL_TABLET | Freq: Every evening | ORAL | Status: DC | PRN

## 2023-02-12 MED ORDER — SODIUM CHLORIDE 0.9 % IR SOLN
Status: DC | PRN
  Administered 2023-02-12: 3000 mL

## 2023-02-12 MED ORDER — LIDOCAINE 2% (20 MG/ML) 5 ML SYRINGE
INTRAMUSCULAR | Status: DC | PRN
  Administered 2023-02-12: 40 mg via INTRAVENOUS

## 2023-02-12 MED ORDER — SODIUM CHLORIDE 0.9 % IV SOLN: Freq: Once | INTRAVENOUS | Status: AC

## 2023-02-12 SURGICAL SUPPLY — 22 items
BAG COUNTER SPONGE SURGICOUNT (BAG) IMPLANT
BAG URO CATCHER STRL LF (MISCELLANEOUS) ×1 IMPLANT
BASKET ZERO TIP NITINOL 2.4FR (BASKET) IMPLANT
CATH URETL OPEN END 6FR 70 (CATHETERS) IMPLANT
CLOTH BEACON ORANGE TIMEOUT ST (SAFETY) ×1 IMPLANT
GLOVE SURG LX STRL 7.5 STRW (GLOVE) ×1 IMPLANT
GOWN STRL REUS W/ TWL XL LVL3 (GOWN DISPOSABLE) ×1 IMPLANT
GOWN STRL REUS W/TWL XL LVL3 (GOWN DISPOSABLE) ×1
GUIDEWIRE STR DUAL SENSOR (WIRE) ×1 IMPLANT
GUIDEWIRE ZIPWRE .038 STRAIGHT (WIRE) IMPLANT
IV NS 1000ML (IV SOLUTION) ×1
IV NS 1000ML BAXH (IV SOLUTION) ×1 IMPLANT
KIT TURNOVER KIT A (KITS) IMPLANT
LASER FIB FLEXIVA PULSE ID 365 (Laser) IMPLANT
MANIFOLD NEPTUNE II (INSTRUMENTS) ×1 IMPLANT
PACK CYSTO (CUSTOM PROCEDURE TRAY) ×1 IMPLANT
SHEATH NAVIGATOR HD 12/14X36 (SHEATH) IMPLANT
STENT URET 6FRX26 CONTOUR (STENTS) IMPLANT
TRACTIP FLEXIVA PULS ID 200XHI (Laser) IMPLANT
TRACTIP FLEXIVA PULSE ID 200 (Laser)
TUBING CONNECTING 10 (TUBING) ×1 IMPLANT
TUBING UROLOGY SET (TUBING) ×1 IMPLANT

## 2023-02-12 NOTE — Op Note (Signed)
Preoperative diagnosis: 1.  Solitary left kidney 2.  Left ureteral obstruction 3.  Acute kidney injury  Postoperative diagnosis: 1.  Solitary left kidney 2.  Left ureteral obstruction 3.  Acute kidney injury 4.  Left ureteral tumor  Procedures: 1.  Cystoscopy 2.  Left retrograde pyelography with interpretation 3.  Left ureteroscopy 4.  Left ureteral stent placement (6 x 26-no string)  Surgeon: Moody Bruins MD  Anesthesia: General  Complications: None  EBL: Minimal  Specimens: None  Indication: Jessica Ritter is a 62 year old female who developed painless gross hematuria.  She has a solitary left kidney with normal baseline renal function.  She underwent a hematuria protocol CT scan on Monday for further evaluation per her primary care physician and was noted to have left hydroureteronephrosis with evidence of an obstructing mass in the distal left ureter.  She subsequently developed a anuria and presented to the emergency department today with a serum creatinine of 5.9.  It was recommended that she proceed with left ureteral stent drainage.  After reviewing options, she and her husband were in agreement to the above procedures.  The potential risks, complications, and expected recovery process was discussed in detail.  Informed consent was obtained.  Description of procedure: The patient was taken to the operating room and a general anesthetic was administered.  She was given preoperative antibiotics, placed in the dorsolithotomy position, and prepped and draped in the usual sterile fashion.  Next, a preoperative timeout was performed.  Cystourethroscopy was then performed.  There was some blood clot within the bladder that was evacuated.  The right ureteral orifice was in its normal position but was not effluxing urine considering her history of right nephrectomy.  No bladder tumors were identified.  The left ureteral orifice was then identified and an attempt was made to perform  a left retrograde pyelogram with a 6 French ureteral catheter.  There appeared to be complete obstruction of the distal ureter.  The proximal ureter which was significantly dilated down to the level of the obstructing mass still had contrast in it from Monday when she had her CT scan.  Attempts to pass a wire up the ureter were unsuccessful met resistance at the level of the mass.  A 6 French semirigid ureteroscope was then advanced into the distal left ureter and after multiple attempts, a 0.38 sensor guidewire was able to be advanced up into the left renal collecting system.  A 6 French ureteral catheter was advanced over the wire and Omnipaque contrast was injected to confirm appropriate placement.  No filling defects were noted in the renal collecting system.  The sensor wire was then replaced and backloaded on the cystoscope.  A 6 x 26 double-J ureteral stent was then advanced over the wire using Seldinger technique and positioned appropriately under fluoroscopic and cystoscopic guidance.  The wire was removed with a good curl noted in the renal pelvis as well as within the bladder.  The bladder was emptied and the procedure was ended.  She tolerated the procedure well without complications.  She was able to be awakened and transferred the recovery unit in satisfactory condition.

## 2023-02-12 NOTE — Transfer of Care (Signed)
Immediate Anesthesia Transfer of Care Note  Patient: Jessica Ritter  Procedure(s) Performed: CYSTOSCOPY WITH RETROGRADE PYELOGRAM, URETEROSCOPY AND STENT PLACEMENT (Left)  Patient Location: PACU  Anesthesia Type:General  Level of Consciousness: oriented, sedated, and patient cooperative  Airway & Oxygen Therapy: Patient Spontanous Breathing and Patient connected to face mask oxygen  Post-op Assessment: Report given to RN and Post -op Vital signs reviewed and stable  Post vital signs: Reviewed  Last Vitals:  Vitals Value Taken Time  BP 102/65 02/12/23 1715  Temp    Pulse 64 02/12/23 1716  Resp 10 02/12/23 1716  SpO2 100 % 02/12/23 1716  Vitals shown include unvalidated device data.  Last Pain:  Vitals:   02/12/23 1559  TempSrc: Oral  PainSc: 0-No pain      Patients Stated Pain Goal: 5 (02/12/23 1559)  Complications: No notable events documented.

## 2023-02-12 NOTE — Anesthesia Procedure Notes (Signed)
Procedure Name: LMA Insertion Date/Time: 02/12/2023 4:39 PM  Performed by: Lovie Chol, CRNAPre-anesthesia Checklist: Patient identified, Emergency Drugs available, Suction available and Patient being monitored Patient Re-evaluated:Patient Re-evaluated prior to induction Oxygen Delivery Method: Circle System Utilized Preoxygenation: Pre-oxygenation with 100% oxygen Induction Type: IV induction Ventilation: Mask ventilation without difficulty LMA: LMA inserted LMA Size: 4.0 Number of attempts: 1 Placement Confirmation: positive ETCO2 and breath sounds checked- equal and bilateral Tube secured with: Tape Dental Injury: Teeth and Oropharynx as per pre-operative assessment

## 2023-02-12 NOTE — ED Notes (Signed)
Pt and husband updated on pt being admitted and possibility of placement of stent today in her ureter

## 2023-02-12 NOTE — Anesthesia Preprocedure Evaluation (Addendum)
Anesthesia Evaluation  Patient identified by MRN, date of birth, ID band Patient awake    Reviewed: Allergy & Precautions, NPO status , Patient's Chart, lab work & pertinent test results  Airway Mallampati: II  TM Distance: >3 FB Neck ROM: Full    Dental  (+) Dental Advisory Given   Pulmonary sleep apnea , former smoker   breath sounds clear to auscultation       Cardiovascular hypertension,  Rhythm:Regular Rate:Normal     Neuro/Psych CVA    GI/Hepatic Neg liver ROS,GERD  ,,  Endo/Other  negative endocrine ROS    Renal/GU ARFRenal diseaseS/p right nephrectomy now with obstruction and ARF.     Musculoskeletal   Abdominal   Peds  Hematology  (+) Blood dyscrasia, anemia   Anesthesia Other Findings   Reproductive/Obstetrics                              Anesthesia Physical Anesthesia Plan  ASA: 3 and emergent  Anesthesia Plan: General   Post-op Pain Management: Ofirmev IV (intra-op)*   Induction: Intravenous  PONV Risk Score and Plan: 3 and Dexamethasone, Ondansetron and Treatment may vary due to age or medical condition  Airway Management Planned: LMA  Additional Equipment:   Intra-op Plan:   Post-operative Plan: Extubation in OR  Informed Consent: I have reviewed the patients History and Physical, chart, labs and discussed the procedure including the risks, benefits and alternatives for the proposed anesthesia with the patient or authorized representative who has indicated his/her understanding and acceptance.     Dental advisory given  Plan Discussed with:   Anesthesia Plan Comments:         Anesthesia Quick Evaluation

## 2023-02-12 NOTE — ED Notes (Signed)
Bladder scan showed 0ml, RN repeated scan and showed 0ml as well.

## 2023-02-12 NOTE — ED Provider Notes (Signed)
[facility] Provider Note   CSN: [id number] Arrival date & time: 02/12/23  1191     History  Chief Complaint  Patient presents with   Urinary Retention    Jessica Ritter is a 62 y.o. female.  CVA, has chronic left-sided weakness, seizures, subdural hematoma, metastatic bladder cancer, renal pelvis cancer.  She was seen recently by PCP for recurrence of hematuria after being treated for UTI, had CT scan showing likely recurrence of her bladder cancer, as the CT scan with contrast 2 days ago, she has not been able to produce any urine, she has been drinking fluids and had some IV fluids with the scan her husband reports.  He is the primary historian  HPI     Home Medications Prior to Admission medications   Medication Sig Start Date End Date Taking? Authorizing Provider  AMBULATORY NON FORMULARY MEDICATION I recommend that Jessica Ritter would benefit from increased physical activity. 08/23/22   Everrett Coombe, DO  AMBULATORY NON FORMULARY MEDICATION bedside commode and front wheeled walker 01/03/23   Everrett Coombe, DO  aspirin EC 81 MG tablet Take 81 mg by mouth daily.    [provider]  atorvastatin (LIPITOR) 80 MG tablet TAKE 1 TABLET BY MOUTH ONCE DAILY AT  6  PM. 02/18/22   Everrett Coombe, DO  Calcium Carbonate-Vitamin D 600-400 MG-UNIT tablet Take 1 tablet by mouth 2 (two) times daily. 04/05/19   Carlis Stable, PA-C  meloxicam (MOBIC) 7.5 MG tablet Take 1 tablet (7.5 mg total) by mouth daily. 01/16/23   Everrett Coombe, DO  nitrofurantoin, macrocrystal-monohydrate, (MACROBID) 100 MG capsule Take 1 capsule (100 mg total) by mouth 2 (two) times daily. 01/30/23   Everrett Coombe, DO  valACYclovir (VALTREX) 500 MG tablet TAKE 1 TABLET BY MOUTH TWICE DAILY FOR 3 DAYS AS NEEDED FOR  OUTBREAKS 12/18/22   Everrett Coombe, DO      Allergies    Anticoagulant compound, Wasp venom protein, Bee venom, Warfarin, and Ciprofloxacin     Review of Systems   Review of Systems  Physical Exam Updated Vital Signs BP 138/71 (BP Location: Right Arm)   Pulse 68   Temp 98.4 F (36.9 C) (Oral)   Resp 18   Ht  (1.676 m)   Wt 74.8 kg   SpO2 94%   BMI 26.63 kg/m  Physical Exam Vitals and nursing note reviewed.  Constitutional:      General: She is not in acute distress.    Appearance: She is well-developed.  HENT:     Head: Normocephalic and atraumatic.  Eyes:     Conjunctiva/sclera: Conjunctivae normal.  Cardiovascular:     Rate and Rhythm: Normal rate and regular rhythm.     Heart sounds: No murmur heard. Pulmonary:     Effort: Pulmonary effort is normal. No respiratory distress.     Breath sounds: Normal breath sounds.  Abdominal:     Palpations: Abdomen is soft.     Tenderness: There is no abdominal tenderness.  Musculoskeletal:        General: No swelling.     Cervical back: Neck supple.  Skin:    General: Skin is warm and dry.     Capillary Refill: Capillary refill takes less than 2 seconds.  Neurological:     Mental Status: She is alert.  Psychiatric:        Mood and Affect: Mood normal.     ED Results / Procedures / Treatments  Labs (all labs ordered are listed, but only abnormal results are displayed) Labs Reviewed  CBC WITH DIFFERENTIAL/PLATELET - Abnormal; Notable for the following components:      Result Value   RBC 3.81 (*)    Hemoglobin 11.2 (*)    HCT 33.2 (*)    All other components within normal limits  COMPREHENSIVE METABOLIC PANEL - Abnormal; Notable for the following components:   Sodium 134 (*)    Glucose, Bld 113 (*)    BUN 85 (*)    Creatinine, Ser 5.85 (*)    GFR, Estimated 8 (*)    All other components within normal limits  URINALYSIS, ROUTINE W REFLEX MICROSCOPIC    EKG None  Radiology CT ABDOMEN PELVIS W WO CONTRAST  Result Date: 02/12/2023 CLINICAL DATA:  Gross hematuria. EXAM: CT ABDOMEN AND PELVIS WITHOUT AND WITH CONTRAST TECHNIQUE: Multidetector CT  imaging of the abdomen and pelvis was performed following the standard protocol before and following the bolus administration of intravenous contrast. RADIATION DOSE REDUCTION: This exam was performed according to the departmental dose-optimization program which includes automated exposure control, adjustment of the mA and/or kV according to patient size and/or use of iterative reconstruction technique. CONTRAST:  OMNIPAQUE IOHEXOL 300 MG/ML  SOLN COMPARISON:  None Available. FINDINGS: Lower chest: Unremarkable Hepatobiliary: Multiple hypoenhancing lesions are seen scattered through both hepatic lobes. Dominant lesions in the left liver measure up to 15 mm and approach water attenuation, likely cysts. Multiple lesions are much too small to characterize but are statistically benign. No followup imaging is recommended. Gallbladder is nondistended. No intrahepatic or extrahepatic biliary dilation. Pancreas: No focal mass lesion. No dilatation of the main duct. No intraparenchymal cyst. No peripancreatic edema. Spleen: No splenomegaly. No focal mass lesion. Adrenals/Urinary Tract: No adrenal nodule or mass. Right kidney surgically absent. Mild to moderate left-sided hydronephrosis evident with tiny hypoattenuating left renal lesions, too small to characterize but likely benign. There is associated mild to moderate left hydroureter down to the pelvis where a focus of ureteral narrowing and circumferential wall enhancement is identified along the pelvic sidewall (axial image 71/6). Ureter then distends before narrowing again just proximal to the UVJ (78/6 and coronal 29/8). There is diffuse enhancement of the distal left ureter at the UVJ. 2 enhancing nodules are seen along the posterior bladder wall mucosa including 8 mm enhancing nodule on image 80/6 and 8 mm nodule on 79/6. These are both probably part of a larger irregular multinodular posterior bladder wall lesion. Stomach/Bowel: Stomach is unremarkable. No  gastric wall thickening. No evidence of outlet obstruction. Duodenum is normally positioned as is the ligament of Treitz. No small bowel wall thickening. No small bowel dilatation. The terminal ileum is normal. The appendix is normal. No gross colonic mass. No colonic wall thickening. Diverticuli are seen scattered along the entire length of the colon without CT findings of diverticulitis. Vascular/Lymphatic: There is mild atherosclerotic calcification of the abdominal aorta without aneurysm. There is no gastrohepatic or hepatoduodenal ligament lymphadenopathy. 10 mm short axis low left para-aortic node is identified at the aortic bifurcation (46/6) 11 mm short axis left common iliac node seen on 51/6. The left para-aortic and common iliac lymphadenopathy is well demonstrated on coronal images 42, 38, and 36 of series 8. Another 11 mm short axis left common iliac node is seen on 53/6. Reproductive: Unremarkable. Other: No intraperitoneal free fluid. Musculoskeletal: Sequelae of prior right groin hernia repair with mesh placement. Mix lucent and sclerotic lesion identified in the inter  trochanteric region of the proximal left femur, indeterminate. IMPRESSION: 1. Mild to moderate left-sided hydroureteronephrosis down to the pelvis where a focus of ureteral narrowing and circumferential wall thickening/enhancement is identified just proximal to and extending into the UVJ. At least 2 enhancing nodules are seen along the posterior bladder wall mucosa near the left UVJ, likely components of a larger mucosal/bladder wall lesion. Imaging features are highly suspicious for urothelial neoplasm. 2. Mild left para-aortic and common iliac lymphadenopathy, suspicious for metastatic disease. 3. Mix lucent and sclerotic lesion identified in the intertrochanteric region of the proximal left femur, indeterminate. This does not appear overtly suspicious for metastatic lesion. Comparison to prior imaging studies recommended and be the  most helpful means to further evaluate and establish chronicity. If this is a new finding, metastatic disease would be a greater concern. 4. Status post right nephrectomy. 5. Multiple hypoenhancing lesions scattered through both hepatic lobes. Dominant lesions in the left liver measure up to 15 mm and approach water attenuation, likely cysts. Multiple lesions are much too small to characterize but are statistically benign. Given the above described findings, attention on follow-up imaging recommended. 6.  Aortic Atherosclerosis (ICD10-I70.0). These results will be called to the ordering clinician or representative by the Radiologist Assistant, and communication documented in the PACS or [employer]. Electronically Signed   By: Kennith Center M.D.   On: 02/12/2023 09:09    Procedures Ultrasound ED Abd  Date/Time: 02/12/2023 10:10 AM  Performed by: Ma Rings, PA-C Authorized by: Ma Rings, PA-C   Procedure details:    Indications: decreased urinary output     Scope of abdominal ultrasound: bladder volume.   Bladder:  Visualized    Images: not archived    Bladder findings:    Free pelvic fluid: not identified     Volume:  5 mL     Medications Ordered in ED Medications  0.9 %  sodium chloride infusion (has no administration in time range)    ED Course/ Medical Decision Making/ A&P Clinical Course as of 02/12/23 1139  Wed Feb 12, 2023  1005 Patient comes with no urinary output since of CT scan 2 days ago which showed likely recurrence of her bladder cancer.  Bladder ultrasound by me shows approximately 5 cc of urine in the bladder, the bladder scanner by nurse also showed no urine in the bladder.  She has been drinking lots of fluids and IV fluids when she had the CT scan.  Denies any bladder distention, fevers or any flank pain.  She has some chronic low back pain has been going on for about a year but has been unchanged. [CB]  1048 Denies AKI, recent CT shows left-sided  hydronephrosis with narrowing of the left UVJ.  Urology will be consulted at this time to consider stenting [CB]    Clinical Course User Index [CB] Ma Rings, PA-C                             Medical Decision Making This patient presents to the ED for concern of no urinary output x 2 days, this involves an extensive number of treatment options, and is a complaint that carries with it a high risk of complications and morbidity.  The differential diagnosis includes other,, urinary retention, other   Co morbidities that complicate the patient evaluation  History of renal pelvis carcinoma on right with right nephrectomy, history of bladder cancer, history of subdural  hematoma, history of CVA   Additional history obtained:  Additional history obtained from EMR External records from outside source obtained and reviewed including PCP notes, CT scan from 2 days ago, oncology note from 2021 at [facility]   Lab Tests:  I Ordered, and personally interpreted labs.  The pertinent results include: CMP shows increased BUN and creatinine of 85 and 5.85 respectively significantly increased from patient's baseline which is normal    Consultations Obtained:  I requested consultation with the urologist, D. Border,  and discussed lab and imaging findings as well as pertinent plan - they recommend: Admit to [facility], they will try to put in a stent today to relieve the obstruction, discussed with hospitalist who is agreeable to accept patient for admission   Problem List / ED Course / Critical interventions / Medication management  Acute renal failure-patient has significant increase in BUN and creatinine from baseline, no urinary output for 2 days, likely secondary to obstructive uropathy which seems to be coming from a possible mass I ordered medication including patient on continuous IV fluids per hospitalist request, will be kept n.p.o. Reevaluation of the patient after these medicines  showed that the patient stayed the same I have reviewed the patients home medicines and have made adjustments as needed   Social Determinants of Health:  Left-sided weakness, husband is caregiver      Amount and/or Complexity of Data Reviewed Labs: ordered.  Risk Prescription drug management.           Final Clinical Impression(s) / ED Diagnoses Final diagnoses:  AKI (acute kidney injury)  Obstructive uropathy    Rx / DC Orders ED Discharge Orders     None         Josem Kaufmann 02/12/23 1139    Rondel Baton, MD 02/12/23 1702

## 2023-02-12 NOTE — Progress Notes (Signed)
Sleep study in 2017 negative for sleep apnea

## 2023-02-12 NOTE — Progress Notes (Addendum)
Urology Consult  Referring physician: Maryln Gottron, MD Reason for referral: Renal failure secondary to obstructive uropathy  Chief Complaint: As above  History of Present Illness: Jessica Ritter is a 62 year old female transferred in from OSH to higher level of care for urgent cystoscopy due to obstructive uropathy in the context of solitary kidney and renal failure.  PMH significant for CVA with left side hemiparesis secondary to RCC with reoccurrence in lung and brain on separate occasions, grade 1 diastolic dysfunction, seizures, and dysarthria.  On assessment today she is alert, oriented, and in no distress.  She has no pain with palpation and only complains of oliguria since Monday.  [facility] was consulted for urgent surgical intervention.  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) 07/02/2017   Gait disturbance, post-stroke 04/03/2018   History of lower GI bleeding 05/2018   History of malignant neoplasm metastatic to lung 2002   History of malignant neoplasm of brain 2003   secondary, renal   History of malignant neoplasm of renal pelvis 1998   History of multiple pulmonary nodules 2002   Hyperlipidemia    Hypertension    Seizures 2016   tapered off of Keppra 06/2018   Sleep apnea    Subdural hematoma caused by concussion 04/2014   Past Surgical History:  Procedure Laterality Date   BRAIN SURGERY     brain tumor surgery     COLONOSCOPY W/ POLYPECTOMY  05/2018   KIDNEY SURGERY     LEG SURGERY     LUNG LOBECTOMY Right 2002   RLL     Allergies:  Allergies  Allergen Reactions   Anticoagulant Compound Other (See Comments)    Subdural Hematoma   Wasp Venom Protein Swelling   Bee Venom Swelling   Warfarin Other (See Comments)    Amyloid angiopathy.  Hih risk  of fatal ICH on treatment dose anticoagulants.  Antiplatelet therapy and DVT prophylaxis is okay.   Ciprofloxacin Nausea And Vomiting    Family History  Problem Relation Age of Onset   Cancer Other        mother    Social History:  reports that she quit smoking about 25 years ago. Her smoking use included cigarettes. She has never used smokeless tobacco. She reports current alcohol use of about 4.0 standard drinks of alcohol per week. She reports that she does not use drugs.  ROS: All systems are reviewed and negative except as noted. Oliguric since Monday.  Physical Exam:  Vital signs in last 24 hours: Temp:  [97.7 F (36.5 C)-98.4 F (36.9 C)] 97.7 F (36.5 C) (04/24 1337) Pulse Rate:  [60-68] 65 (04/24 1337) Resp:  [18] 18 (04/24 1337) BP: (133-139)/(71-85) 139/85 (04/24 1337) SpO2:  [94 %-98 %] 97 % (04/24 1337) Weight:  [74.8 kg] 74.8 kg (04/24 0923)  Cardiovascular: Skin warm; not flushed Respiratory: Breaths quiet; no shortness of breath Abdomen: No masses, no guarding, soft to palpation Neurological: Dysarthria Musculoskeletal: Post CVA left-sided weakness Lymphatics: No inguinal adenopathy Skin: No rashes Genitourinary:  Laboratory Data:  Results for orders placed or performed during the hospital encounter of 02/12/23 (from the past 72 hour(s))  CBC with Differential     Status: Abnormal   Collection Time: 02/12/23  9:54 AM  Result Value Ref Range  WBC 7.4 4.0 - 10.5 K/uL   RBC 3.81 (L) 3.87 - 5.11 MIL/uL   Hemoglobin 11.2 (L) 12.0 - 15.0 g/dL   HCT 16.1 (L) 09.6 - 04.5 %   MCV 87.1 80.0 - 100.0 fL   MCH 29.4 26.0 - 34.0 pg   MCHC 33.7 30.0 - 36.0 g/dL   RDW 40.9 81.1 - 91.4 %   Platelets 185 150 - 400 K/uL   nRBC 0.0 0.0 - 0.2 %   Neutrophils Relative % 64 %   Neutro Abs 4.8 1.7 - 7.7 K/uL   Lymphocytes Relative 23 %   Lymphs Abs 1.7 0.7 - 4.0 K/uL   Monocytes Relative 7 %   Monocytes Absolute 0.5 0.1 - 1.0 K/uL   Eosinophils Relative 5 %    Eosinophils Absolute 0.4 0.0 - 0.5 K/uL   Basophils Relative 1 %   Basophils Absolute 0.1 0.0 - 0.1 K/uL   Immature Granulocytes 0 %   Abs Immature Granulocytes 0.02 0.00 - 0.07 K/uL    Comment: Performed at [facility], [address] [place], [place] [address]  Comprehensive metabolic panel     Status: Abnormal   Collection Time: 02/12/23  9:54 AM  Result Value Ref Range   Sodium 134 (L) 135 - 145 mmol/L   Potassium 4.1 3.5 - 5.1 mmol/L   Chloride 99 98 - 111 mmol/L   CO2 22 22 - 32 mmol/L   Glucose, Bld 113 (H) 70 - 99 mg/dL    Comment: Glucose reference range applies only to samples taken after fasting for at least 8 hours.   BUN 85 (H) 6 - 20 mg/dL   Creatinine, Ser 6.21 (H) 0.44 - 1.00 mg/dL   Calcium 9.4 8.9 - 30.8 mg/dL   Total Protein 6.5 6.5 - 8.1 g/dL   Albumin 3.5 3.5 - 5.0 g/dL   AST 22 15 - 41 U/L   ALT 17 0 - 44 U/L   Alkaline Phosphatase 64 38 - 126 U/L   Total Bilirubin 0.8 0.3 - 1.2 mg/dL   GFR, Estimated 8 (L) >60 mL/min    Comment: (NOTE) Calculated using the CKD-EPI Creatinine Equation (2021)    Anion gap 13 5 - 15    Comment: Performed at [facility], [address] [place], [place] [address]   No results found for this or any previous visit (from the past 240 hour(s)). Creatinine: Recent Labs    02/12/23 0954  CREATININE 5.85*    CT Abd/Pelvis: Independent review of imaging reveals solitary kidney with hydroureteronephrosis to the level of the bladder where there is an obstructing mass at the level of the UVJ.  Impression/Assessment:  Acute renal failure in the context of obstructed solitary kidney   Plan:  Continue IV fluids Daily labs To the OR for urgent cystoscopy with left retrograde pyelogram and left ureteral stent placement with Dr. Jory Sims Beryle Bagsby 02/12/2023, 3:25 PM

## 2023-02-12 NOTE — Anesthesia Postprocedure Evaluation (Signed)
Anesthesia Post Note  Patient: Jessica Ritter  Procedure(s) Performed: CYSTOSCOPY WITH RETROGRADE PYELOGRAM, URETEROSCOPY AND STENT PLACEMENT (Left)     Patient location during evaluation: PACU Anesthesia Type: General Level of consciousness: awake and alert Pain management: pain level controlled Vital Signs Assessment: post-procedure vital signs reviewed and stable Respiratory status: spontaneous breathing, nonlabored ventilation, respiratory function stable and patient connected to nasal cannula oxygen Cardiovascular status: blood pressure returned to baseline and stable Postop Assessment: no apparent nausea or vomiting Anesthetic complications: no  No notable events documented.  Last Vitals:  Vitals:   02/12/23 1730 02/12/23 1745  BP: 124/75 121/75  Pulse: 64 71  Resp: 11 15  Temp:    SpO2: 97% 93%    Last Pain:  Vitals:   02/12/23 1745  TempSrc:   PainSc: 0-No pain                 Kennieth Rad

## 2023-02-12 NOTE — H&P (Addendum)
History and Physical  [place] Severs WUJ:[id number] DOB: 11-16-1960 DOA: 02/12/2023  PCP: Everrett Coombe, DO   Chief Complaint: No urine output.   HPI: Judianne Ritter is a 62 y.o. female with medical history significant for CVA with residual right-sided weakness, malignant neoplasm of the right kidney status post nephrectomy who is being admitted to the hospital with acute oliguric renal failure.  History is provided mainly by the patient's husband who was at the bedside and a good historian.  He states that she intermittently has suffered from UTI symptoms, as well as some gross hematuria.  She most recently was on a course of oral antibiotics for E. coli UTI earlier this month, finished a 7-day course on around 02/05/2023.  She had a repeat urinalysis and culture afterwards, since she continued to have some intermittent gross hematuria.  This culture came back negative, since there is no further evidence of UTI, she was sent to her urologist for evaluation and they ordered CT scan of the abdomen and pelvis with and without contrast, which was done on 4/22.  She has baseline normal renal function.  Since the CT scan on 4/22, her husband states that she has had no urine output.  The patient feels well, she denies abdominal pain, fevers, chills, nausea, vomiting.  She has been drinking lots of water and juice in the last 24 hours.  Due to concerns about not making any urine, she presented this morning with her husband to the ER.  ED Course: In the emergency department, bladder scan confirmed no urine in the bladder.  Vital signs have been stable, lab work shows some anemia, electrolytes are unremarkable, creatinine is 5.85 most recent values in our system about 7 months ago shows normal renal function.  ER provider spoke with on-call urology, who recommends admission to the hospitalist service and they plan stent placement today.  Patient was kept n.p.o., was kept on IV fluids at my request.  Review of  Systems: Please see HPI for pertinent positives and negatives. A complete 10 system review of systems are otherwise negative.  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) 07/02/2017   Gait disturbance, post-stroke 04/03/2018   History of lower GI bleeding 05/2018   History of malignant neoplasm metastatic to lung 2002   History of malignant neoplasm of brain 2003   secondary, renal   History of malignant neoplasm of renal pelvis 1998   History of multiple pulmonary nodules 2002   Hyperlipidemia    Hypertension    Seizures 2016   tapered off of Keppra 06/2018   Sleep apnea    Subdural hematoma caused by concussion 04/2014   Past Surgical History:  Procedure Laterality Date   BRAIN SURGERY     brain tumor surgery     COLONOSCOPY W/ POLYPECTOMY  05/2018   KIDNEY SURGERY     LEG SURGERY     LUNG LOBECTOMY Right 2002   RLL    Social History:  reports that she quit smoking about 25 years ago. Her smoking use included cigarettes. She has never used smokeless tobacco. She reports current alcohol use of about 4.0 standard drinks of alcohol per week. She reports that she does not use drugs.   Allergies  Allergen Reactions   Anticoagulant Compound Other (See  Comments)    Subdural Hematoma   Wasp Venom Protein Swelling   Bee Venom Swelling   Warfarin Other (See Comments)    Amyloid angiopathy.  Hih risk of fatal ICH on treatment dose anticoagulants.  Antiplatelet therapy and DVT prophylaxis is okay.   Ciprofloxacin Nausea And Vomiting    Family History  Problem Relation Age of Onset   Cancer Other        mother      Prior to Admission medications   Medication Sig Start Date End Date Taking? Authorizing Provider  AMBULATORY NON FORMULARY MEDICATION I recommend that Jessica Ritter would benefit from increased physical activity. 08/23/22  Yes Everrett Coombe, DO  AMBULATORY NON FORMULARY MEDICATION bedside commode and front wheeled walker 01/03/23  Yes Everrett Coombe, DO  aspirin EC 81 MG tablet Take 81 mg by mouth daily.   Yes [provider]  atorvastatin (LIPITOR) 80 MG tablet TAKE 1 TABLET BY MOUTH ONCE DAILY AT  6  PM. Patient taking differently: Take 80 mg by mouth daily at 6 PM. 02/18/22  Yes Everrett Coombe, DO  Calcium Carbonate-Vitamin D 600-400 MG-UNIT tablet Take 1 tablet by mouth 2 (two) times daily. 04/05/19  Yes Carlis Stable, PA-C  meloxicam (MOBIC) 7.5 MG tablet Take 1 tablet (7.5 mg total) by mouth daily. Patient taking differently: Take 7.5 mg by mouth as needed for pain. 01/16/23  Yes Everrett Coombe, DO  valACYclovir (VALTREX) 500 MG tablet TAKE 1 TABLET BY MOUTH TWICE DAILY FOR 3 DAYS AS NEEDED FOR  OUTBREAKS Patient taking differently: Take 500 mg by mouth 2 (two) times daily as needed (for outbreaks). 12/18/22  Yes Everrett Coombe, DO  nitrofurantoin, macrocrystal-monohydrate, (MACROBID) 100 MG capsule Take 1 capsule (100 mg total) by mouth 2 (two) times daily. Patient not taking: Reported on 02/12/2023 01/30/23   Everrett Coombe, DO    Physical Exam: BP 139/85 (BP Location: Right Arm)   Pulse 65   Temp 97.7 F (36.5 C) (Oral)   Resp 18   Ht 5\' 6"  (1.676 m)   Wt 74.8 kg   SpO2 97%   BMI 26.63 kg/m   General:  Alert, oriented, calm, in no acute distress, she looks chronically ill, but comfortable.  Speech is somewhat slow and slurred, at baseline per her husband who is at the bedside. Eyes: EOMI, clear conjuctivae, white sclerea Neck: supple, no masses, trachea mildline  Cardiovascular: RRR, no murmurs or rubs, no peripheral edema  Respiratory: clear to auscultation bilaterally, no wheezes, no crackles  Abdomen: soft, nontender, nondistended, normal bowel tones heard  Skin: dry, no rashes  Musculoskeletal: no joint  effusions, normal range of motion  Psychiatric: appropriate affect, normal speech  Neurologic: extraocular muscles intact, clear speech, moving all extremities with intact sensorium          Labs on Admission:  Basic Metabolic Panel: Recent Labs  Lab 02/12/23 0954  NA 134*  K 4.1  CL 99  CO2 22  GLUCOSE 113*  BUN 85*  CREATININE 5.85*  CALCIUM 9.4   Liver Function Tests: Recent Labs  Lab 02/12/23 0954  AST 22  ALT 17  ALKPHOS 64  BILITOT 0.8  PROT 6.5  ALBUMIN 3.5   No results for input(s): "LIPASE", "AMYLASE" in the last 168 hours. No results for input(s): "AMMONIA" in the last 168 hours. CBC: Recent Labs  Lab 02/12/23 0954  WBC 7.4  NEUTROABS 4.8  HGB 11.2*  HCT 33.2*  MCV 87.1  PLT 185  Cardiac Enzymes: No results for input(s): "CKTOTAL", "CKMB", "CKMBINDEX", "TROPONINI" in the last 168 hours.  BNP (last 3 results) No results for input(s): "BNP" in the last 8760 hours.  ProBNP (last 3 results) No results for input(s): "PROBNP" in the last 8760 hours.  CBG: No results for input(s): "GLUCAP" in the last 168 hours.  Radiological Exams on Admission: CT with and without contrast on 4/22: 1. Mild to moderate left-sided hydroureteronephrosis down to the pelvis where a focus of ureteral narrowing and circumferential wall thickening/enhancement is identified just proximal to and extending into the UVJ. At least 2 enhancing nodules are seen along the posterior bladder wall mucosa near the left UVJ, likely components of a larger mucosal/bladder wall lesion. Imaging features are highly suspicious for urothelial neoplasm. 2. Mild left para-aortic and common iliac lymphadenopathy, suspicious for metastatic disease. 3. Mix lucent and sclerotic lesion identified in the intertrochanteric region of the proximal left femur, indeterminate. This does not appear overtly suspicious for metastatic lesion. Comparison to prior imaging studies recommended and be the  most helpful means to further evaluate and establish chronicity. If this is a new finding, metastatic disease would be a greater concern. 4. Status post right nephrectomy. 5. Multiple hypoenhancing lesions scattered through both hepatic lobes. Dominant lesions in the left liver measure up to 15 mm and approach water attenuation, likely cysts. Multiple lesions are much too small to characterize but are statistically benign. Given the above described findings, attention on follow-up imaging recommended. 6.  Aortic Atherosclerosis (ICD10-I70.0)  Assessment/Plan Principal Problem:   Acute renal failure with oliguria-patient presents with oliguric renal failure after receiving IV contrast for CT scan on 4/22.  She also takes meloxicam daily for chronic back pain.  Evidence of recurrent bladder mass, and left para-aortic and common iliac lymphadenopathy suspicious for metastatic disease.  Renal failure likely due to combination of NSAID use, hydronephrosis from bladder mass, and contrast nephropathy. -Observation admission -Avoid nephrotoxins, discontinue meloxicam -Continue normal saline hydration, caution due to history of grade 1 diastolic dysfunction; currently the patient looks euvolemic with no evidence of fluid overload -Keep n.p.o., Dr. Pamala Hurry of urology is aware that she is in-house, plans ureteroscopy with stent placement today -Nonurgent nephrology consultation requested due to solitary kidney, discussed with Dr. Kathrene Bongo -Pain and nausea control as needed -Will replace her meloxicam with tramadol as needed   Cerebral amyloid angiopathy (HCC)-anticoagulation is contraindicated for this reason   Hyperlipidemia   GERD (gastroesophageal reflux disease)   Overweight (BMI 25.0-29.9)   Grade I diastolic dysfunction   Hypertension goal BP (blood pressure) < 130/80-currently at goal   History of CVA with residual deficit  DVT prophylaxis: SCDs    Code Status: Full Code  Consults  called: Urology, nephrology Dr. Alan Mulder who will see in AM.  Admission status: Observation  Time spent: 65 minutes  Thierno Hun Sharlette Dense MD [facility] Pager [phone]  If 7PM-7AM, please contact night-coverage [email or web address] Password [facility]  02/12/2023, 2:56 PM

## 2023-02-12 NOTE — Progress Notes (Signed)
48F history of RCC right nephrectomy had CT due to hematuria which shows hydro and likely new bladder mass. Since the CT no UOP. Urology to see today for stent.   Requested keep npo and on iv fluids.  Not on blood thinners.  Electrolytes stable.

## 2023-02-12 NOTE — ED Notes (Signed)
Family  notified of pt being admitted and that [facility] is the transport service

## 2023-02-12 NOTE — Plan of Care (Signed)
Urology following   Problem: Urinary Elimination: Goal: Progression of disease will be identified and treated Outcome: Progressing   Problem: Safety: Goal: Ability to remain free from injury will improve 02/12/2023 1500 by Shirleen Schirmer, RN Outcome: Progressing 02/12/2023 1459 by Shirleen Schirmer, RN Outcome: Progressing   Problem: Elimination: Goal: Will not experience complications related to bowel motility 02/12/2023 1500 by Shirleen Schirmer, RN Outcome: Progressing 02/12/2023 1459 by Shirleen Schirmer, RN Outcome: Progressing

## 2023-02-12 NOTE — ED Triage Notes (Signed)
C/o not being able to urinate since Monday afternoon. States had a CT with contrast and since then has been unable to urinate. Denies abdominal pain today. Noted some blood in brief this morning.  Husband at bedside. Hx of stroke with speech difficulty and left sided weakness.

## 2023-02-12 NOTE — H&P (View-Only) (Signed)
Urology Consult  Referring physician: Ikramullah, Mir M, MD Reason for referral: Renal failure secondary to obstructive uropathy  Chief Complaint: As above  History of Present Illness: Jessica Ritter is a 61-year-old female transferred in from OSH to higher level of care for urgent cystoscopy due to obstructive uropathy in the context of solitary kidney and renal failure.  PMH significant for CVA with left side hemiparesis secondary to RCC with reoccurrence in lung and brain on separate occasions, grade 1 diastolic dysfunction, seizures, and dysarthria.  On assessment today she is alert, oriented, and in no distress.  She has no pain with palpation and only complains of oliguria since Monday.  [facility] was consulted for urgent surgical intervention.  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) 07/02/2017   Gait disturbance, post-stroke 04/03/2018   History of lower GI bleeding 05/2018   History of malignant neoplasm metastatic to lung 2002   History of malignant neoplasm of brain 2003   secondary, renal   History of malignant neoplasm of renal pelvis 1998   History of multiple pulmonary nodules 2002   Hyperlipidemia    Hypertension    Seizures 2016   tapered off of Keppra 06/2018   Sleep apnea    Subdural hematoma caused by concussion 04/2014   Past Surgical History:  Procedure Laterality Date   BRAIN SURGERY     brain tumor surgery     COLONOSCOPY W/ POLYPECTOMY  05/2018   KIDNEY SURGERY     LEG SURGERY     LUNG LOBECTOMY Right 2002   RLL     Allergies:  Allergies  Allergen Reactions   Anticoagulant Compound Other (See Comments)    Subdural Hematoma   Wasp Venom Protein Swelling   Bee Venom Swelling   Warfarin Other (See Comments)    Amyloid angiopathy.  Hih risk  of fatal ICH on treatment dose anticoagulants.  Antiplatelet therapy and DVT prophylaxis is okay.   Ciprofloxacin Nausea And Vomiting    Family History  Problem Relation Age of Onset   Cancer Other        mother    Social History:  reports that she quit smoking about 25 years ago. Her smoking use included cigarettes. She has never used smokeless tobacco. She reports current alcohol use of about 4.0 standard drinks of alcohol per week. She reports that she does not use drugs.  ROS: All systems are reviewed and negative except as noted. Oliguric since Monday.  Physical Exam:  Vital signs in last 24 hours: Temp:  [97.7 F (36.5 C)-98.4 F (36.9 C)] 97.7 F (36.5 C) (04/24 1337) Pulse Rate:  [60-68] 65 (04/24 1337) Resp:  [18] 18 (04/24 1337) BP: (133-139)/(71-85) 139/85 (04/24 1337) SpO2:  [94 %-98 %] 97 % (04/24 1337) Weight:  [74.8 kg] 74.8 kg (04/24 0923)  Cardiovascular: Skin warm; not flushed Respiratory: Breaths quiet; no shortness of breath Abdomen: No masses, no guarding, soft to palpation Neurological: Dysarthria Musculoskeletal: Post CVA left-sided weakness Lymphatics: No inguinal adenopathy Skin: No rashes Genitourinary:  Laboratory Data:  Results for orders placed or performed during the hospital encounter of 02/12/23 (from the past 72 hour(s))  CBC with Differential     Status: Abnormal   Collection Time: 02/12/23  9:54 AM  Result Value Ref Range     WBC 7.4 4.0 - 10.5 K/uL   RBC 3.81 (L) 3.87 - 5.11 MIL/uL   Hemoglobin 11.2 (L) 12.0 - 15.0 g/dL   HCT 33.2 (L) 36.0 - 46.0 %   MCV 87.1 80.0 - 100.0 fL   MCH 29.4 26.0 - 34.0 pg   MCHC 33.7 30.0 - 36.0 g/dL   RDW 13.3 11.5 - 15.5 %   Platelets 185 150 - 400 K/uL   nRBC 0.0 0.0 - 0.2 %   Neutrophils Relative % 64 %   Neutro Abs 4.8 1.7 - 7.7 K/uL   Lymphocytes Relative 23 %   Lymphs Abs 1.7 0.7 - 4.0 K/uL   Monocytes Relative 7 %   Monocytes Absolute 0.5 0.1 - 1.0 K/uL   Eosinophils Relative 5 %    Eosinophils Absolute 0.4 0.0 - 0.5 K/uL   Basophils Relative 1 %   Basophils Absolute 0.1 0.0 - 0.1 K/uL   Immature Granulocytes 0 %   Abs Immature Granulocytes 0.02 0.00 - 0.07 K/uL    Comment: Performed at [facility] [place], [address] [place], [place] [address]  Comprehensive metabolic panel     Status: Abnormal   Collection Time: 02/12/23  9:54 AM  Result Value Ref Range   Sodium 134 (L) 135 - 145 mmol/L   Potassium 4.1 3.5 - 5.1 mmol/L   Chloride 99 98 - 111 mmol/L   CO2 22 22 - 32 mmol/L   Glucose, Bld 113 (H) 70 - 99 mg/dL    Comment: Glucose reference range applies only to samples taken after fasting for at least 8 hours.   BUN 85 (H) 6 - 20 mg/dL   Creatinine, Ser 5.85 (H) 0.44 - 1.00 mg/dL   Calcium 9.4 8.9 - 10.3 mg/dL   Total Protein 6.5 6.5 - 8.1 g/dL   Albumin 3.5 3.5 - 5.0 g/dL   AST 22 15 - 41 U/L   ALT 17 0 - 44 U/L   Alkaline Phosphatase 64 38 - 126 U/L   Total Bilirubin 0.8 0.3 - 1.2 mg/dL   GFR, Estimated 8 (L) >60 mL/min    Comment: (NOTE) Calculated using the CKD-EPI Creatinine Equation (2021)    Anion gap 13 5 - 15    Comment: Performed at [facility] [place], [address] [place], [place] [address]   No results found for this or any previous visit (from the past 240 hour(s)). Creatinine: Recent Labs    02/12/23 0954  CREATININE 5.85*    CT Abd/Pelvis: Independent review of imaging reveals solitary kidney with hydroureteronephrosis to the level of the bladder where there is an obstructing mass at the level of the UVJ.  Impression/Assessment:  Acute renal failure in the context of obstructed solitary kidney   Plan:  Continue IV fluids Daily labs To the OR for urgent cystoscopy with left retrograde pyelogram and left ureteral stent placement with Dr. Borden  Attila Mccarthy W Blayne Frankie 02/12/2023, 3:25 PM      

## 2023-02-13 ENCOUNTER — Encounter (HOSPITAL_COMMUNITY): Payer: Self-pay | Admitting: Urology

## 2023-02-13 ENCOUNTER — Encounter: Payer: Medicare Other | Admitting: Urology

## 2023-02-13 DIAGNOSIS — N179 Acute kidney failure, unspecified: Principal | ICD-10-CM | POA: Diagnosis present

## 2023-02-13 DIAGNOSIS — K219 Gastro-esophageal reflux disease without esophagitis: Secondary | ICD-10-CM | POA: Diagnosis present

## 2023-02-13 DIAGNOSIS — E663 Overweight: Secondary | ICD-10-CM | POA: Diagnosis present

## 2023-02-13 DIAGNOSIS — Z85841 Personal history of malignant neoplasm of brain: Secondary | ICD-10-CM | POA: Diagnosis not present

## 2023-02-13 DIAGNOSIS — E785 Hyperlipidemia, unspecified: Secondary | ICD-10-CM | POA: Diagnosis present

## 2023-02-13 DIAGNOSIS — G8929 Other chronic pain: Secondary | ICD-10-CM | POA: Diagnosis present

## 2023-02-13 DIAGNOSIS — Z87891 Personal history of nicotine dependence: Secondary | ICD-10-CM | POA: Diagnosis not present

## 2023-02-13 DIAGNOSIS — Z881 Allergy status to other antibiotic agents status: Secondary | ICD-10-CM | POA: Diagnosis not present

## 2023-02-13 DIAGNOSIS — R34 Anuria and oliguria: Secondary | ICD-10-CM | POA: Diagnosis present

## 2023-02-13 DIAGNOSIS — Z902 Acquired absence of lung [part of]: Secondary | ICD-10-CM | POA: Diagnosis not present

## 2023-02-13 DIAGNOSIS — I10 Essential (primary) hypertension: Secondary | ICD-10-CM | POA: Diagnosis present

## 2023-02-13 DIAGNOSIS — Z79899 Other long term (current) drug therapy: Secondary | ICD-10-CM | POA: Diagnosis not present

## 2023-02-13 DIAGNOSIS — N139 Obstructive and reflux uropathy, unspecified: Secondary | ICD-10-CM | POA: Diagnosis present

## 2023-02-13 DIAGNOSIS — G4730 Sleep apnea, unspecified: Secondary | ICD-10-CM | POA: Diagnosis present

## 2023-02-13 DIAGNOSIS — Z91030 Bee allergy status: Secondary | ICD-10-CM | POA: Diagnosis not present

## 2023-02-13 DIAGNOSIS — N131 Hydronephrosis with ureteral stricture, not elsewhere classified: Secondary | ICD-10-CM | POA: Diagnosis present

## 2023-02-13 DIAGNOSIS — D649 Anemia, unspecified: Secondary | ICD-10-CM | POA: Diagnosis present

## 2023-02-13 DIAGNOSIS — I69354 Hemiplegia and hemiparesis following cerebral infarction affecting left non-dominant side: Secondary | ICD-10-CM | POA: Diagnosis not present

## 2023-02-13 DIAGNOSIS — Z8551 Personal history of malignant neoplasm of bladder: Secondary | ICD-10-CM | POA: Diagnosis not present

## 2023-02-13 DIAGNOSIS — Z6826 Body mass index (BMI) 26.0-26.9, adult: Secondary | ICD-10-CM | POA: Diagnosis not present

## 2023-02-13 DIAGNOSIS — I680 Cerebral amyloid angiopathy: Secondary | ICD-10-CM | POA: Diagnosis present

## 2023-02-13 DIAGNOSIS — Z888 Allergy status to other drugs, medicaments and biological substances status: Secondary | ICD-10-CM | POA: Diagnosis not present

## 2023-02-13 DIAGNOSIS — Z905 Acquired absence of kidney: Secondary | ICD-10-CM | POA: Diagnosis not present

## 2023-02-13 DIAGNOSIS — Z8553 Personal history of malignant neoplasm of renal pelvis: Secondary | ICD-10-CM | POA: Diagnosis not present

## 2023-02-13 DIAGNOSIS — Z85118 Personal history of other malignant neoplasm of bronchus and lung: Secondary | ICD-10-CM | POA: Diagnosis not present

## 2023-02-13 LAB — BASIC METABOLIC PANEL
Anion gap: 13 (ref 5–15)
Anion gap: 12 (ref 5–15)
BUN: 88 mg/dL — ABNORMAL HIGH (ref 6–20)
BUN: 89 mg/dL — ABNORMAL HIGH (ref 6–20)
CO2: 21 mmol/L — ABNORMAL LOW (ref 22–32)
CO2: 21 mmol/L — ABNORMAL LOW (ref 22–32)
Calcium: 9.4 mg/dL (ref 8.9–10.3)
Calcium: 9 mg/dL (ref 8.9–10.3)
Chloride: 102 mmol/L (ref 98–111)
Chloride: 103 mmol/L (ref 98–111)
Creatinine, Ser: 5.53 mg/dL — ABNORMAL HIGH (ref 0.44–1.00)
Creatinine, Ser: 4.12 mg/dL — ABNORMAL HIGH (ref 0.44–1.00)
GFR, Estimated: 8 mL/min — ABNORMAL LOW (ref >60)
GFR, Estimated: 12 mL/min — ABNORMAL LOW (ref >60)
Glucose, Bld: 161 mg/dL — ABNORMAL HIGH (ref 70–99)
Glucose, Bld: 144 mg/dL — ABNORMAL HIGH (ref 70–99)
Potassium: 5.1 mmol/L (ref 3.5–5.1)
Potassium: 4.5 mmol/L (ref 3.5–5.1)
Sodium: 136 mmol/L (ref 135–145)
Sodium: 136 mmol/L (ref 135–145)

## 2023-02-13 LAB — CBC
HCT: 33.5 % — ABNORMAL LOW (ref 36.0–46.0)
Hemoglobin: 11.3 g/dL — ABNORMAL LOW (ref 12.0–15.0)
MCH: 30.1 pg (ref 26.0–34.0)
MCHC: 33.7 g/dL (ref 30.0–36.0)
MCV: 89.3 fL (ref 80.0–100.0)
Platelets: 189 10*3/uL (ref 150–400)
RBC: 3.75 MIL/uL — ABNORMAL LOW (ref 3.87–5.11)
RDW: 13.2 % (ref 11.5–15.5)
WBC: 4.6 10*3/uL (ref 4.0–10.5)
nRBC: 0 % (ref 0.0–0.2)

## 2023-02-13 NOTE — Progress Notes (Addendum)
1 Day Post-Op Subjective: Patient looks much better this morning.  She is accompanied by her husband.  No acute events overnight.  She has a very rhonchorous wet cough.  She reports that she has some transient dysphagia in the past but it has not been an ongoing problem.  Planning to ambulate  Objective: Vital signs in last 24 hours: Temp:  [97.5 F (36.4 C)-98.4 F (36.9 C)] 97.5 F (36.4 C) (04/25 0618) Pulse Rate:  [56-71] 56 (04/25 0618) Resp:  [10-20] 19 (04/25 0618) BP: (102-142)/(65-85) 122/68 (04/25 0618) SpO2:  [93 %-100 %] 98 % (04/25 0618)  Intake/Output from previous day: 04/24 0701 - 04/25 0700 In: 1511.2 [I.V.:1311.2; IV Piggyback:200] Out: 1576 [Urine:1575; Blood:1]  Intake/Output this shift: Total I/O In: 240 [P.O.:240] Out: 600 [Urine:600]  Physical Exam:  General: Alert and oriented CV: RRR, palpable distal pulses Lungs: CTAB, equal chest rise Abdomen: Soft, NTND, no rebound or guarding Ext: NT, No erythema  Lab Results: Recent Labs    02/12/23 0954 02/13/23 0528  HGB 11.2* 11.3*  HCT 33.2* 33.5*   BMET Recent Labs    02/12/23 1540 02/13/23 0528  NA 133* 136  K 4.5 5.1  CL 98 102  CO2 22 21*  GLUCOSE 90 161*  BUN 88* 88*  CREATININE 6.25* 5.53*  CALCIUM 9.3 9.4     Studies/Results: DG C-Arm 1-60 Min-No Report  Result Date: 02/12/2023 Fluoroscopy was utilized by the requesting physician.  No radiographic interpretation.    Assessment/Plan: -POD 1, left ureteral stent placement for obstructing mass at the level of the UVJ, in the context of solitary kidney.  -Slow improvement in serum creatinine though somewhat better today.  We discussed that patient would need to show a few days of predictable improvement before discussing discharge.  They are amenable to the plan.  -Wet rhonchorous cough.  Discussed using incentive spirometer or flutter valve in the short-term and focusing on just drinking water in the event that she is having some  aspiration.   LOS: 0 days   Elmon Kirschner, NP [facility]  02/13/2023, 11:34 AM    I have seen and examined the patient and agree with the above assessment and plan.  UOP excellent and renal function starting to improve.  Will continue to monitor renal function and would expect to return to baseline.  Will need outpatient ureteroscopy for biopsy of distal ureteral mass for further diagnostic evaluation once renal function has normalized.

## 2023-02-13 NOTE — Progress Notes (Addendum)
PROGRESS NOTE    Jessica Ritter  JXB:[id number] DOB: 1961-09-25 DOA: 02/12/2023 PCP: Everrett Coombe, DO   Brief Narrative:  Jessica Ritter is a 62 y.o. female with medical history significant for CVA with residual right-sided weakness, malignant neoplasm of the right kidney status post nephrectomy who is being admitted to the hospital with acute oliguric renal failure. Recently completed 7 day course of antibiotics for Ecoli uti (finished 4/7) - outpatient CT 4/22 admitted by hospitalist team due to poor UOP/hematuria/profound AKI from baseline.   Assessment & Plan:   Principal Problem:   Acute renal failure with oliguria Active Problems:   Cerebral amyloid angiopathy (HCC)   Hyperlipidemia   GERD (gastroesophageal reflux disease)   Overweight (BMI 25.0-29.9)   Grade I diastolic dysfunction   Hypertension goal BP (blood pressure) < 130/80   History of CVA with residual deficit   Mass of bladder  Acute renal failure with oliguria, without history of CKD Secondary to L ureteral obstruction -Complicated by history of single left kidney, prior right kidney nephrectomy due to renal cell carcinoma -Status post left retrograde pyelography with ureteral stent 6 x 26 no string -Appreciate urology insight and recommendations -Urine output improving, appears to be clearing up less hematuria than noted previously -Increase p.o. intake, will discontinue IV fluids once appropriate -Repeat labs this afternoon indicate marked improvement in renal function already.  Chronic comorbid conditions, stable Cerebral amyloid angiopathy (HCC)-anticoagulation is contraindicated for this reason Hyperlipidemia -resume atorvastatin at discharge GERD (gastroesophageal reflux disease)-diet controlled Overweight (BMI 25.0-29.9) Grade I diastolic dysfunction-appears euvolemic, not on diuretics Hypertension goal BP (blood pressure) < 130/80-currently at goal History of CVA with residual deficit-appears at  baseline per husband  DVT prophylaxis: None in the setting of above hematuria Code Status: Full Family Communication: Husband at bedside  Status is: Inpatient  Dispo: The patient is from: Home              Anticipated d/c is to: Home              Anticipated d/c date is: 24 to 48 hours              Patient currently not medically stable for discharge  Consultants:  Urology  Procedures:  Ureteral stent placing 4/24  Antimicrobials:  Perioperatively  Subjective: No acute issues or events overnight patient in good spirits this morning appears to be at baseline per husband at bedside she denies nausea vomiting diarrhea constipation headache fevers chills or chest pain.  She does have ongoing cough, but has no complaints in regards to her respiratory status  Objective: Vitals:   02/12/23 1828 02/12/23 2105 02/13/23 0255 02/13/23 0618  BP: 127/72 120/76 103/73 122/68  Pulse: (!) 57 65 (!) 59 (!) 56  Resp: Temp: 97.6 F (36.4 C) 98.4 F (36.9 C) 98 F (36.7 C) (!) 97.5 F (36.4 C)  TempSrc: Oral Oral Oral Oral  SpO2: 96% 96% 100% 98%  Weight:      Height:        Intake/Output Summary (Last 24 hours) at 02/13/2023 0732 Last data filed at 02/13/2023 0646 Gross per 24 hour  Intake 1511.23 ml  Output 1576 ml  Net -64.77 ml   Filed Weights   02/12/23 0923  Weight: 74.8 kg    Examination:  General:  Pleasantly resting in bed, No acute distress. Neck:  Without mass or deformity.  Trachea is midline. Lungs:  Clear to auscultate bilaterally without rhonchi, wheeze, or  rales. Heart:  Regular rate and rhythm.  Without murmurs, rubs, or gallops. Abdomen:  Soft, nontender, nondistended.  Without guarding or rebound. Extremities: Without cyanosis, clubbing, edema, or obvious deformity. Skin:  Warm and dry, no erythema  Data Reviewed: I have personally reviewed following labs and imaging studies  CBC: Recent Labs  Lab 02/12/23 0954 02/13/23 0528  WBC 7.4  4.6  NEUTROABS 4.8  --   HGB 11.2* 11.3*  HCT 33.2* 33.5*  MCV 87.1 89.3  PLT 185 189   Basic Metabolic Panel: Recent Labs  Lab 02/12/23 0954 02/12/23 1540 02/13/23 0528  NA 134* 133* 136  K 4.1 4.5 5.1  CL 99 98 102  CO2 22 22 21*  GLUCOSE 113* 90 161*  BUN 85* 88* 88*  CREATININE 5.85* 6.25* 5.53*  CALCIUM 9.4 9.3 9.4   GFR: Estimated Creatinine Clearance: 11.2 mL/min (A) (by C-G formula based on SCr of 5.53 mg/dL (H)). Liver Function Tests: Recent Labs  Lab 02/12/23 0954  AST 22  ALT 17  ALKPHOS 64  BILITOT 0.8  PROT 6.5  ALBUMIN 3.5    Recent Results (from the past 240 hour(s))  Surgical PCR screen     Status: None   Collection Time: 02/12/23  3:33 PM   Specimen: Nasal Mucosa; Nasal Swab  Result Value Ref Range Status   MRSA, PCR NEGATIVE NEGATIVE Final   Staphylococcus aureus NEGATIVE NEGATIVE Final    Comment: (NOTE) The Xpert SA Assay (FDA approved for NASAL specimens in patients 28 years of age and older), is one component of a comprehensive surveillance program. It is not intended to diagnose infection nor to guide or monitor treatment. Performed at [facility], [address], [place], [place] [address]     Radiology Studies: DG C-Arm 1-60 Min-No Report  Result Date: 02/12/2023 Fluoroscopy was utilized by the requesting physician.  No radiographic interpretation.    Scheduled Meds: Continuous Infusions:  sodium chloride 75 mL/hr at 02/13/23 0646     LOS: 0 days    Time spent:  Azucena Fallen, DO [facility]  If 7PM-7AM, please contact night-coverage [email or web address]  02/13/2023, 7:32 AM

## 2023-02-13 NOTE — Progress Notes (Signed)
PT Cancellation Note  Patient Details Name: Jessica Ritter MRN: [id number] DOB: 1961/09/19   Cancelled Treatment:     PT attempted this pm but pt just back to bed with nursing.  Will follow.   Trudy Kory 02/13/2023, 4:53 PM

## 2023-02-13 NOTE — TOC Initial Note (Signed)
Transition of Care [facility]) - Initial/Assessment Note    Patient Details  Name: Jessica Ritter MRN: [id number] Date of Birth: [birth year]  Transition of Care [facility]) CM/SW Contact:    Lanier Clam, RN Phone Number: 02/13/2023, 2:40 PM  Clinical Narrative: Active w/[facility] resume services @ d/c.                  Expected Discharge Plan: Home w Home Health Services Barriers to Discharge: Continued Medical Work up   Patient Goals and CMS Choice Patient states their goals for this hospitalization and ongoing recovery are:: Home          Expected Discharge Plan and Services   Discharge Planning Services: CM Consult Post Acute Care Choice: Home Health Living arrangements for the past 2 months: Single Family Home                                      Prior Living Arrangements/Services Living arrangements for the past 2 months: Single Family Home                Current home services: Home PT (Active w/[facility])    Activities of Daily Living Home Assistive Devices/Equipment: Environmental consultant (specify type) ADL Screening (condition at time of admission) Patient's cognitive ability adequate to safely complete daily activities?: No Is the patient deaf or have difficulty hearing?: No Does the patient have difficulty seeing, even when wearing glasses/contacts?: No Does the patient have difficulty concentrating, remembering, or making decisions?: No Patient able to express need for assistance with ADLs?: Yes Does the patient have difficulty dressing or bathing?: Yes Independently performs ADLs?: No Communication: Independent Dressing (OT): Needs assistance Is this a change from baseline?: Pre-admission baseline Grooming: Needs assistance Is this a change from baseline?: Pre-admission baseline Feeding: Independent Bathing: Needs assistance Is this a change from baseline?: Pre-admission baseline Toileting: Needs assistance, Independent with device  (comment) Is this a change from baseline?: Pre-admission baseline In/Out Bed: Needs assistance, Independent with device (comment) Is this a change from baseline?: Pre-admission baseline Walks in Home: Needs assistance, Independent with device (comment) Is this a change from baseline?: Pre-admission baseline Does the patient have difficulty walking or climbing stairs?: Yes Weakness of Legs: Both Weakness of Arms/Hands: None  Permission Sought/Granted                  Emotional Assessment              Admission diagnosis:  Obstructive uropathy [N13.9] AKI (acute kidney injury) [N17.9] Mass of bladder [N32.89] Acute renal failure due to urinary obstruction [N17.9, N13.9] Patient Active Problem List   Diagnosis Date Noted   Acute renal failure due to urinary obstruction 02/13/2023   Mass of bladder 02/12/2023   Acute renal failure with oliguria 02/12/2023   Low back pain 08/23/2022   Acute cystitis with hematuria 06/20/2022   Hematuria 06/20/2022   Epidermoid cyst 02/18/2022   History of seizure 04/05/2019   History of CVA with residual deficit 04/05/2019   History of lower GI bleeding 04/05/2019   Gait disturbance, post-stroke 04/03/2018   Medication monitoring encounter 04/03/2018   Hypertension goal BP (blood pressure) < 130/80 04/03/2018   At high risk for falls 04/03/2018   Post-menopausal atrophic vaginitis 10/01/2017   Overweight (BMI 25.0-29.9) 07/02/2017   Grade I diastolic dysfunction 07/02/2017   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) 07/02/2017  Tinnitus of both ears 07/02/2017   Bilateral hearing loss due to cerumen impaction 07/02/2017   Recurrent herpes simplex virus (HSV) infection of buttock 11/27/2016   History of bladder cancer 11/25/2016   GERD (gastroesophageal reflux disease) 05/14/2016   Left leg weakness 05/14/2016   Osteopenia 05/09/2015   Cerebral amyloid angiopathy (HCC) 04/21/2015   History of subdural  hematoma 04/21/2015   History of sleep apnea 04/21/2015   Hyperlipidemia 04/21/2015   Secondary malignant neoplasm of lung 11/06/2012   Secondary malignant neoplasm of brain and spinal cord 11/06/2012   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   PCP:  Everrett Coombe, DO Pharmacy:   [facility] [id number] [place] - [phone] [place] [address] Phone: [phone] Fax: [phone]     Social Determinants of Health (SDOH) Social History: SDOH Screenings   Food Insecurity: No Food Insecurity (02/12/2023)  Housing: Low Risk  (02/12/2023)  Transportation Needs: No Transportation Needs (02/12/2023)  Utilities: Not At Risk (02/12/2023)  Depression (PHQ2-9): Low Risk  (08/23/2022)  Tobacco Use: Medium Risk (02/13/2023)   SDOH Interventions:     Readmission Risk Interventions     No data to display

## 2023-02-13 NOTE — Consult Note (Signed)
[facility] Renal Consultation Note  Requesting MD: Natale Milch Indication for Consultation: AKI  HPI:  Jessica Ritter is a 62 y.o. female with past medical history significant for CVA with residual right-sided weakness and speech issues, malignancy of the right kidney status post nephrectomy possibly in 1998.  Normal creatinine noted in August 2023.  Patient had issues of late of dysuria status post a few rounds of antibiotics for UTI.  The symptoms continued so imaging was obtained.  In addition, patient's urine output had dropped off.  Imaging showed left hydronephrosis with evidence of an obstructing mass in the distal left ureter.  Upon presentation creatinine was 5.8 but then worsened to 6.2.  Urology was called and patient last evening underwent ureteral stent placement.  Patient had immediate production of urine output, total of almost 1500 overnight and creatinine down to 5.5.  I see her eating lunch.  Her husband is not present.  Patient has some issues communicating secondary to residual speech issues from the stroke but really denies any symptoms.  She is not sure if her urine is bloody.  She has been getting normal saline at 75 an hour but is not edematous.  She does not appear to be uremic  Creatinine  Date/Time Value Ref Range Status  09/07/2015 12:00 AM 0.8 0.5 - 1.1 mg/dL Final  16/07/9603 54:09 AM 0.9 0.5 - 1.1 mg/dL Final   Creat  Date/Time Value Ref Range Status  06/20/2022 10:14 AM 0.93 0.50 - 1.03 mg/dL Final  81/19/1478 29:56 AM 1.06 (H) 0.50 - 1.03 mg/dL Final  21/30/8657 84:69 AM 0.85 0.50 - 1.05 mg/dL Final    Comment:    For patients >19 years of age, the reference limit for Creatinine is approximately 13% higher for people identified as African-American. .   07/28/2019 01:35 PM 0.93 0.50 - 1.05 mg/dL Final    Comment:    For patients >21 years of age, the reference limit for Creatinine is approximately 13% higher for people identified as  African-American. .   04/03/2018 02:41 PM 0.99 0.50 - 1.05 mg/dL Final    Comment:    For patients >62 years of age, the reference limit for Creatinine is approximately 13% higher for people identified as African-American. Marland Kitchen   02/04/2017 08:08 AM 0.96 0.50 - 1.05 mg/dL Final    Comment:      For patients > or = 61 years of age: The upper reference limit for Creatinine is approximately 13% higher for people identified as African-American.     05/14/2016 11:50 AM 0.94 0.50 - 1.05 mg/dL Final    Comment:      For patients > or = 61 years of age: The upper reference limit for Creatinine is approximately 13% higher for people identified as African-American.      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) 07/02/2017   Gait disturbance, post-stroke 04/03/2018   History of lower GI bleeding 05/2018   History of malignant neoplasm metastatic to lung 2002  History of malignant neoplasm of brain 2003   secondary, renal   History of malignant neoplasm of renal pelvis 1998   History of multiple pulmonary nodules 2002   Hyperlipidemia    Hypertension    Seizures 2016   tapered off of Keppra 06/2018   Sleep apnea    Subdural hematoma caused by concussion 04/2014    Past Surgical History:  Procedure Laterality Date   BRAIN SURGERY     brain tumor surgery     COLONOSCOPY W/ POLYPECTOMY  05/2018   CYSTOSCOPY WITH RETROGRADE PYELOGRAM, URETEROSCOPY AND STENT PLACEMENT Left 02/12/2023   Procedure: CYSTOSCOPY WITH RETROGRADE PYELOGRAM, URETEROSCOPY AND  STENT PLACEMENT;  Surgeon: Heloise Purpura, MD;  Location: [facility];  Service: Urology;  Laterality: Left;   KIDNEY SURGERY     LEG SURGERY     LUNG LOBECTOMY Right 2002   RLL    Family Hx:  Family History  Problem Relation Age of Onset   Cancer Other        mother     Social History:  reports that she quit smoking about 25 years ago. Her smoking use included cigarettes. She has never used smokeless tobacco. She reports current alcohol use of about 4.0 standard drinks of alcohol per week. She reports that she does not use drugs.  Allergies:  Allergies  Allergen Reactions   Anticoagulant Compound Other (See Comments)    Subdural Hematoma   Wasp Venom Protein Swelling   Bee Venom Swelling   Warfarin Other (See Comments)    Amyloid angiopathy.  Hih risk of fatal ICH on treatment dose anticoagulants.  Antiplatelet therapy and DVT prophylaxis is okay.   Ciprofloxacin Nausea And Vomiting    Medications: Prior to Admission medications   Medication Sig Start Date End Date Taking? Authorizing Provider  AMBULATORY NON FORMULARY MEDICATION I recommend that [name] would benefit from increased physical activity. 08/23/22  Yes Everrett Coombe, DO  AMBULATORY NON FORMULARY MEDICATION bedside commode and front wheeled walker 01/03/23  Yes Everrett Coombe, DO  aspirin EC 81 MG tablet Take 81 mg by mouth daily.   Yes [provider]  atorvastatin (LIPITOR) 80 MG tablet TAKE 1 TABLET BY MOUTH ONCE DAILY AT  6  PM. Patient taking differently: Take 80 mg by mouth daily at 6 PM. 02/18/22  Yes Everrett Coombe, DO  Calcium Carbonate-Vitamin D 600-400 MG-UNIT tablet Take 1 tablet by mouth 2 (two) times daily. 04/05/19  Yes Carlis Stable, PA-C  valACYclovir (VALTREX) 500 MG tablet TAKE 1 TABLET BY MOUTH TWICE DAILY FOR 3 DAYS AS NEEDED FOR  OUTBREAKS Patient taking differently: Take 500 mg by mouth 2 (two) times daily as needed (for outbreaks). 12/18/22  Yes Everrett Coombe, DO    I  have reviewed the patient's current medications.  Labs:  Results for orders placed or performed during the hospital encounter of 02/12/23 (from the past 48 hour(s))  CBC with Differential     Status: Abnormal   Collection Time: 02/12/23  9:54 AM  Result Value Ref Range   WBC 7.4 4.0 - 10.5 K/uL   RBC 3.81 (L) 3.87 - 5.11 MIL/uL   Hemoglobin 11.2 (L) 12.0 - 15.0 g/dL   HCT 16.1 (L) 09.6 - 04.5 %   MCV 87.1 80.0 - 100.0 fL   MCH 29.4 26.0 - 34.0 pg   MCHC 33.7 30.0 - 36.0 g/dL   RDW 40.9 81.1 - 91.4 %   Platelets 185 150 - 400 K/uL  nRBC 0.0 0.0 - 0.2 %   Neutrophils Relative % 64 %   Neutro Abs 4.8 1.7 - 7.7 K/uL   Lymphocytes Relative 23 %   Lymphs Abs 1.7 0.7 - 4.0 K/uL   Monocytes Relative 7 %   Monocytes Absolute 0.5 0.1 - 1.0 K/uL   Eosinophils Relative 5 %   Eosinophils Absolute 0.4 0.0 - 0.5 K/uL   Basophils Relative 1 %   Basophils Absolute 0.1 0.0 - 0.1 K/uL   Immature Granulocytes 0 %   Abs Immature Granulocytes 0.02 0.00 - 0.07 K/uL    Comment: Performed at [facility], [address] [place], [place] [address]  Comprehensive metabolic panel     Status: Abnormal   Collection Time: 02/12/23  9:54 AM  Result Value Ref Range   Sodium 134 (L) 135 - 145 mmol/L   Potassium 4.1 3.5 - 5.1 mmol/L   Chloride 99 98 - 111 mmol/L   CO2 22 22 - 32 mmol/L   Glucose, Bld 113 (H) 70 - 99 mg/dL    Comment: Glucose reference range applies only to samples taken after fasting for at least 8 hours.   BUN 85 (H) 6 - 20 mg/dL   Creatinine, Ser 6.04 (H) 0.44 - 1.00 mg/dL   Calcium 9.4 8.9 - 54.0 mg/dL   Total Protein 6.5 6.5 - 8.1 g/dL   Albumin 3.5 3.5 - 5.0 g/dL   AST 22 15 - 41 U/L   ALT 17 0 - 44 U/L   Alkaline Phosphatase 64 38 - 126 U/L   Total Bilirubin 0.8 0.3 - 1.2 mg/dL   GFR, Estimated 8 (L) >60 mL/min    Comment: (NOTE) Calculated using the CKD-EPI Creatinine Equation (2021)    Anion gap 13 5 - 15    Comment: Performed at [facility], [address] [place], [place] [address]  Surgical PCR screen     Status: None   Collection Time: 02/12/23  3:33 PM   Specimen: Nasal Mucosa; Nasal Swab  Result Value Ref Range   MRSA, PCR NEGATIVE NEGATIVE   Staphylococcus aureus NEGATIVE NEGATIVE    Comment: (NOTE) The Xpert SA Assay (FDA approved for NASAL specimens in patients 43 years of age and older), is one component of a comprehensive surveillance program. It is not intended to diagnose infection nor to guide or monitor treatment. Performed at [facility], [address] [place], [place] [address]   HIV Antibody (routine testing w rflx)     Status: None   Collection Time: 02/12/23  3:40 PM  Result Value Ref Range   HIV Screen 4th Generation wRfx Non Reactive Non Reactive    Comment: Performed at [facility], [address], [place], [place] [address]  Basic metabolic panel     Status: Abnormal   Collection Time: 02/12/23  3:40 PM  Result Value Ref Range   Sodium 133 (L) 135 - 145 mmol/L   Potassium 4.5 3.5 - 5.1 mmol/L   Chloride 98 98 - 111 mmol/L   CO2 22 22 - 32 mmol/L   Glucose, Bld 90 70 - 99 mg/dL    Comment: Glucose reference range applies only to samples taken after fasting for at least 8 hours.   BUN 88 (H) 6 - 20 mg/dL   Creatinine, Ser 3.08 (H) 0.44 - 1.00 mg/dL   Calcium 9.3 8.9 - 65.7 mg/dL   GFR, Estimated 7 (L) >60 mL/min  Comment: (NOTE) Calculated using the CKD-EPI Creatinine Equation (2021)    Anion gap 13 5 - 15    Comment: Performed at [facility], [address] [place], [place] [address]  Urinalysis, Routine w reflex microscopic -Urine, Catheterized     Status: Abnormal   Collection Time: 02/12/23  6:36 PM  Result Value Ref Range   Color, Urine YELLOW YELLOW   APPearance HAZY (A) CLEAR   Specific Gravity, Urine 1.012 1.005 - 1.030   pH 7.0 5.0 - 8.0   Glucose, UA NEGATIVE NEGATIVE mg/dL   Hgb urine dipstick LARGE (A) NEGATIVE   Bilirubin  Urine NEGATIVE NEGATIVE   Ketones, ur NEGATIVE NEGATIVE mg/dL   Protein, ur 30 (A) NEGATIVE mg/dL   Nitrite NEGATIVE NEGATIVE   Leukocytes,Ua TRACE (A) NEGATIVE   RBC / HPF >50 0 - 5 RBC/hpf   WBC, UA 0-5 0 - 5 WBC/hpf   Bacteria, UA RARE (A) NONE SEEN   Squamous Epithelial / HPF 0-5 0 - 5 /HPF    Comment: Performed at [facility], [address] [place], [place] [address]  CBC     Status: Abnormal   Collection Time: 02/13/23  5:28 AM  Result Value Ref Range   WBC 4.6 4.0 - 10.5 K/uL   RBC 3.75 (L) 3.87 - 5.11 MIL/uL   Hemoglobin 11.3 (L) 12.0 - 15.0 g/dL   HCT 09.8 (L) 11.9 - 14.7 %   MCV 89.3 80.0 - 100.0 fL   MCH 30.1 26.0 - 34.0 pg   MCHC 33.7 30.0 - 36.0 g/dL   RDW 82.9 56.2 - 13.0 %   Platelets 189 150 - 400 K/uL   nRBC 0.0 0.0 - 0.2 %    Comment: Performed at [facility], [address], [place], [place] [address]  Basic metabolic panel     Status: Abnormal   Collection Time: 02/13/23  5:28 AM  Result Value Ref Range   Sodium 136 135 - 145 mmol/L   Potassium 5.1 3.5 - 5.1 mmol/L   Chloride 102 98 - 111 mmol/L   CO2 21 (L) 22 - 32 mmol/L   Glucose, Bld 161 (H) 70 - 99 mg/dL    Comment: Glucose reference range applies only to samples taken after fasting for at least 8 hours.   BUN 88 (H) 6 - 20 mg/dL   Creatinine, Ser 4.69 (H) 0.44 - 1.00 mg/dL   Calcium 9.4 8.9 - 62.9 mg/dL   GFR, Estimated 8 (L) >60 mL/min    Comment: (NOTE) Calculated using the CKD-EPI Creatinine Equation (2021)    Anion gap 13 5 - 15    Comment: Performed at [facility], [address] [place], [place] [address]     ROS:  A comprehensive review of systems was negative.  Physical Exam: Vitals:   02/13/23 0255 02/13/23 0618  BP: 103/73 122/68  Pulse: (!) 59 (!) 56  Resp: 20 19  Temp: 98 F (36.7 C) (!) 97.5 F (36.4 C)  SpO2: 100% 98%     General: Moderately obese white female with some speech difficulties but in no acute  distress HEENT: Pupils equal round reactive to light, extraocular motions are intact, mucous membranes moist  Neck: No JVD Heart: Regular rate and rhythm Lungs: Mostly clear Abdomen: BS, soft, nontender Extremities: Trace edema Skin: Warm and dry Neuro: Previous deficits from stroke  Assessment/Plan: 62 year old white female with history of stroke and status post nephrectomy-now presents with  obstruction of solitary kidney with question of ureteral mass 1.Renal-normal renal function in August 2023.  Now, not surprisingly with obstruction of solitary kidney has developed AKI.  Creatinine a little better status post stenting and urine output is present.  I have no further recommendations at this time.  Hopefully, we will see continued improvement of renal function.  We are not sure how long she was obstructed.  Right now urology just working on relief of obstruction.  Plans for biopsy/management of this mass in the near future.  It is difficult to tell the patient does not seem to be overly uremic.  Potassium and bicarb are within normal limits so no dialysis indications.  Will continue to monitor closely with you 2. Hypertension/volume  -patient does not have a history of hypertension and is not on any blood pressure medications.  Is receiving normal saline with a reasonable volume status so okay to continue 3.  Urology-status post stenting of ureter on 4/24.  Additional management of this possible mass plan for the future 4. Anemia  -is not a significant issue at this point.  No management is needed   Cecille Aver 02/13/2023, 1:02 PM

## 2023-02-14 DIAGNOSIS — N179 Acute kidney failure, unspecified: Secondary | ICD-10-CM | POA: Diagnosis not present

## 2023-02-14 DIAGNOSIS — R34 Anuria and oliguria: Secondary | ICD-10-CM | POA: Diagnosis not present

## 2023-02-14 DIAGNOSIS — N139 Obstructive and reflux uropathy, unspecified: Secondary | ICD-10-CM

## 2023-02-14 LAB — CBC
HCT: 32 % — ABNORMAL LOW (ref 36.0–46.0)
Hemoglobin: 10.5 g/dL — ABNORMAL LOW (ref 12.0–15.0)
MCH: 29.9 pg (ref 26.0–34.0)
MCHC: 32.8 g/dL (ref 30.0–36.0)
MCV: 91.2 fL (ref 80.0–100.0)
Platelets: 201 10*3/uL (ref 150–400)
RBC: 3.51 MIL/uL — ABNORMAL LOW (ref 3.87–5.11)
RDW: 13.3 % (ref 11.5–15.5)
WBC: 10.5 10*3/uL (ref 4.0–10.5)
nRBC: 0 % (ref 0.0–0.2)

## 2023-02-14 LAB — BASIC METABOLIC PANEL
Anion gap: 12 (ref 5–15)
BUN: 78 mg/dL — ABNORMAL HIGH (ref 6–20)
CO2: 21 mmol/L — ABNORMAL LOW (ref 22–32)
Calcium: 8.5 mg/dL — ABNORMAL LOW (ref 8.9–10.3)
Chloride: 106 mmol/L (ref 98–111)
Creatinine, Ser: 3.58 mg/dL — ABNORMAL HIGH (ref 0.44–1.00)
GFR, Estimated: 14 mL/min — ABNORMAL LOW (ref >60)
Glucose, Bld: 120 mg/dL — ABNORMAL HIGH (ref 70–99)
Potassium: 4.5 mmol/L (ref 3.5–5.1)
Sodium: 139 mmol/L (ref 135–145)

## 2023-02-14 MED ORDER — MUPIROCIN 2 % EX OINT
TOPICAL_OINTMENT | Freq: Two times a day (BID) | CUTANEOUS | Status: DC
  Administered 2023-02-14: 1 via NASAL
  Filled 2023-02-14: qty 22

## 2023-02-14 NOTE — Evaluation (Signed)
Physical Therapy Evaluation Patient Details Name: Jessica Ritter MRN: [id number] DOB: [birth year] Today's Date: 02/14/2023  History of Present Illness  Jessica Ritter is a 62 y.o. female with medical history significant for CVA with residual right-sided weakness, malignant neoplasm of the right kidney, neoplasm of brain, status post nephrectomy who is being admitted 02/12/23  to the hospital with acute oliguric renal failure. S/P1.  Cystoscopy  2.  Left retrograde pyelography with interpretation  3.  Left ureteroscopy  4.  Left ureteral stent placement on 4/24.  Clinical Impression  Pt admitted with above diagnosis.  Pt currently with functional limitations due to the deficits listed below (see PT Problem List). Pt will benefit from acute skilled PT to increase their independence and safety with mobility to allow discharge.     The patient's spouse present and provided majority of  information of PLOF.  Patient requires assistance for ambulation and ADL's at baseline.  Patient is home alone for several hours each day while spouse works.  Patient requires mod assistance for ambulation using RW, x 25' x 2. Spouse present, providing positive feedback and directions to patient to improve step length and control. Patient will benefit from further PT while in acute care and after Dc.     Recommendations for follow up therapy are one component of a multi-disciplinary discharge planning process, led by the attending physician.  Recommendations may be updated based on patient status, additional functional criteria and insurance authorization.  Follow Up Recommendations       Assistance Recommended at Discharge Frequent or constant Supervision/Assistance  Patient can return home with the following  A lot of help with walking and/or transfers;A lot of help with bathing/dressing/bathroom;Assistance with cooking/housework;Assist for transportation;Help with stairs or ramp for entrance    Equipment  Recommendations None recommended by PT  Recommendations for Other Services       Functional Status Assessment Patient has had a recent decline in their functional status and demonstrates the ability to make significant improvements in function in a reasonable and predictable amount of time.     Precautions / Restrictions Precautions Precautions: Fall Restrictions Weight Bearing Restrictions: No      Mobility  Bed Mobility Overal bed mobility: Needs Assistance Bed Mobility: Supine to Sit     Supine to sit: Min assist     General bed mobility comments: spouse assisted to sitting, min asist    Transfers Overall transfer level: Needs assistance Equipment used: Rolling walker (2 wheels) Transfers: Sit to/from Stand Sit to Stand: Mod assist           General transfer comment: retropulsion  when standing, assistance to lean forward into RW.    Ambulation/Gait Ambulation/Gait assistance: Mod assist, +2 safety/equipment Gait Distance (Feet): 25 Feet (x 2) Assistive device: Rolling walker (2 wheels) Gait Pattern/deviations: Step-to pattern, Decreased weight shift to right, Decreased weight shift to left, Decreased dorsiflexion - right, Decreased dorsiflexion - left, Scissoring, Ataxic Gait velocity: decr     General Gait Details: patient  provided verbal cues for each step, decreased control of swing with eack leg, less control on the left,  Stairs            Wheelchair Mobility    Modified Rankin (Stroke Patients Only)       Balance Overall balance assessment: Needs assistance, History of Falls Sitting-balance support: Bilateral upper extremity supported, Feet supported Sitting balance-Leahy Scale: Fair     Standing balance support: Bilateral upper extremity supported, During functional activity, Reliant on assistive  device for balance Standing balance-Leahy Scale: Poor                               Pertinent Vitals/Pain Pain  Assessment Pain Assessment: No/denies pain    Home Living Family/patient expects to be discharged to:: Private residence Living Arrangements: Spouse/significant other Available Help at Discharge: Family;Available PRN/intermittently Type of Home: House Home Access: Level entry       Home Layout: One level Home Equipment: Agricultural consultant (2 wheels);Rollator (4 wheels);Wheelchair - manual      Prior Function Prior Level of Function : Needs assist;History of Falls (last six months)  Cognitive Assist : Mobility (cognitive);ADLs (cognitive)   ADLs (Cognitive): Step by step cues Physical Assist : Mobility (physical);ADLs (physical) Mobility (physical): Bed mobility;Transfers;Gait ADLs (physical): Grooming;Bathing;Dressing;Toileting Mobility Comments: spouse assists with ambulation using RW, patient is not to amb when spouse gone. ADLs Comments: spouse assists with bath.     Hand Dominance   Dominant Hand: Right    Extremity/Trunk Assessment   Upper Extremity Assessment LUE Deficits / Details: able to grip the RW, effort to get hand to RW    Lower Extremity Assessment Lower Extremity Assessment: LLE deficits/detail LLE Deficits / Details: noted palntar flexed foot during swing, can actively  dorsiflex in supine, Increased tone    Cervical / Trunk Assessment Cervical / Trunk Assessment: Normal  Communication   Communication: Expressive difficulties;Receptive difficulties  Cognition Arousal/Alertness: Awake/alert Behavior During Therapy: WFL for tasks assessed/performed Overall Cognitive Status: Difficult to assess                                 General Comments: able to answer simple questions, spouse mostly answers for patient although he does give her time to answer as able.        General Comments      Exercises     Assessment/Plan    PT Assessment Patient needs continued PT services  PT Problem List Decreased strength;Decreased  mobility;Decreased cognition;Decreased safety awareness;Decreased balance;Decreased coordination;Decreased knowledge of precautions;Impaired tone       PT Treatment Interventions DME instruction;Therapeutic activities;Balance training;Cognitive remediation;Functional mobility training;Patient/family education;Neuromuscular re-education    PT Goals (Current goals can be found in the Care Plan section)  Acute Rehab PT Goals Patient Stated Goal: go, continue HHPT, improve ambulation PT Goal Formulation: With patient/family Time For Goal Achievement: 02/28/23 Potential to Achieve Goals: Fair    Frequency Min 1X/week     Co-evaluation               AM-PAC PT "6 Clicks" Mobility  Outcome Measure Help needed turning from your back to your side while in a flat bed without using bedrails?: A Lot Help needed moving from lying on your back to sitting on the side of a flat bed without using bedrails?: A Lot Help needed moving to and from a bed to a chair (including a wheelchair)?: A Lot Help needed standing up from a chair using your arms (e.g., wheelchair or bedside chair)?: A Lot Help needed to walk in hospital room?: A Lot Help needed climbing 3-5 steps with a railing? : Total 6 Click Score: 11    End of Session Equipment Utilized During Treatment: Gait belt Activity Tolerance: Patient tolerated treatment well Patient left: in chair;with call bell/phone within reach;with chair alarm set;with family/visitor present Nurse Communication: Mobility status PT Visit Diagnosis: Unsteadiness on  feet (R26.81);Muscle weakness (generalized) (M62.81);Other symptoms and signs involving the nervous system (R29.898);Difficulty in walking, not elsewhere classified (R26.2);Hemiplegia and hemiparesis Hemiplegia - Right/Left: Left Hemiplegia - caused by: Other Nontraumatic intracranial hemorrhage;Other cerebrovascular disease    Time: 1051-1119 PT Time Calculation (min) (ACUTE ONLY): 28  min   Charges:   PT Evaluation $PT Eval Low Complexity: 1 Low PT Treatments $Gait Training: 8-22 mins        Blanchard Kelch PT Acute Rehabilitation Services Office [phone] Weekend pager-[phone]   Rada Hay 02/14/2023, 1:16 PM

## 2023-02-14 NOTE — TOC Progression Note (Signed)
Transition of Care [facility]) - Progression Note    Patient Details  Name: Jessica Ritter MRN: [id number] Date of Birth: December 27, 1960  Transition of [facility]) CM/SW Contact  Damian Hofstra, Olegario Messier, RN Phone Number: 02/14/2023, 1:43 PM  Clinical Narrative: Per spouse Theodoro Grist decline resuming HHC services w/[facility]-No preference-[facility] rep Cindie accepted HHPT/CSW-provided her Dave(spouse) contact tel#. Has own transport home.       Expected Discharge Plan: Home w Home Health Services Barriers to Discharge: Continued Medical Work up  Expected Discharge Plan and Services   Discharge Planning Services: CM Consult Post Acute Care Choice: Home Health Living arrangements for the past 2 months: Single Family Home                                       Social Determinants of Health (SDOH) Interventions SDOH Screenings   Food Insecurity: No Food Insecurity (02/12/2023)  Housing: Low Risk  (02/12/2023)  Transportation Needs: No Transportation Needs (02/12/2023)  Utilities: Not At Risk (02/12/2023)  Depression (PHQ2-9): Low Risk  (08/23/2022)  Tobacco Use: Medium Risk (02/13/2023)    Readmission Risk Interventions     No data to display

## 2023-02-14 NOTE — Progress Notes (Signed)
[facility] PROGRESS NOTE  Jessica Ritter (DOB: [birth year]) ZOX:[id number] PCP: Jessica Coombe, DO  Brief Narrative: Jessica Ritter is a 62 y.o. female with medical history significant for CVA with residual right-sided weakness, RCC s/p right nephrectomy, recent E. coli UTI (finished 7 days antibiotics on 4/7) who had hematuria prompting outpatient CT 4/22 showing left hydroureteronephrosis, distal left ureteral obstructing mass, and presented to the ED with AKI (SCr 5.9), subsequently admitted and had ureteral stent placement 4/24. Renal function is improving slowly, patient remains on IVF with renal impairment.  Subjective: No pain, feels better than at admission, has been urinating, still somewhat dark, no red blood. Ate better this morning. Had a skin tag on her back pulled with a small wound they're worried getting infected.  Objective: BP 109/67 (BP Location: Right Arm)   Pulse (!) 49   Temp 97.7 F (36.5 C) (Oral)   Resp 20   Ht 5\' 6"  (1.676 m)   Wt 74.8 kg   SpO2 100%   BMI 26.63 kg/m   Gen: No distress, dysarthria noted which is stable Pulm: Clear, nonlabored  CV: RRR, no MRG GI: Soft, NT, ND, +BS Neuro: Alert and oriented. No new focal deficits. Ext: Warm, no deformities. Skin: Very small wound at base of acrochordon on right midback, no erythema or discharge or tenderness. No other rashes, lesions or ulcers on visualized skin   Assessment & Plan: Acute renal failure due to left ureteral obstruction in patient with solitary kidney: Now s/p left retrograde pyelography with ureteral stent 6 x 26 no string on 4/24 by Dr. Laverle Patter.  -Appreciate urology insight and recommendations - Would benefit from additional monitoring and IVF. Urine output is pocking up, but creatinine remains significantly elevated.   Acrochordon:  - Apply mupirocin BID while wound exists as preventive measure  Chronic comorbid conditions, stable Cerebral amyloid angiopathy (HCC)-anticoagulation is  contraindicated for this reason Hyperlipidemia -resume atorvastatin at discharge GERD (gastroesophageal reflux disease)-diet controlled Overweight (BMI 25.0-29.9) Grade I diastolic dysfunction-appears euvolemic, not on diuretics Hypertension goal BP (blood pressure) < 130/80-currently at goal History of CVA with residual deficit-appears at baseline per husband  Jessica Nine, MD [facility] [email or web address] 02/14/2023, 1:37 PM

## 2023-02-14 NOTE — Progress Notes (Signed)
Subjective:  1949 UOP and crt down Objective Vital signs in last 24 hours: Vitals:   02/13/23 1327 02/13/23 2013 02/14/23 0449 02/14/23 1533  BP: 119/72 123/63 109/67 (!) 115/58  Pulse: 64 (!) 54 (!) 49 62  Resp: 16 16 20 17   Temp: 97.8 F (36.6 C) 98.2 F (36.8 C) 97.7 F (36.5 C) 98.3 F (36.8 C)  TempSrc: Oral Oral Oral Oral  SpO2: 97% 100% 100% 100%  Weight:      Height:       Weight change:   Intake/Output Summary (Last 24 hours) at 02/14/2023 1622 Last data filed at 02/14/2023 0930 Gross per 24 hour  Intake 360 ml  Output 850 ml  Net -490 ml    Assessment/Plan: 62 year old white female with history of stroke and status post nephrectomy-now presents with obstruction of solitary kidney with question of ureteral mass 1.Renal-normal renal function in August 2023.  Now, not surprisingly with obstruction of solitary kidney has developed AKI.  Creatinine better status post stenting and urine output is good.  I have no further recommendations at this time.  Hopefully, we will see continued improvement of renal function.  We are not sure how long she was obstructed.  Right now urology just working on relief of obstruction.  Plans for biopsy/management of this mass in the near future.  It is difficult to tell but the patient does not seem to be overly uremic.  Potassium and bicarb are within normal limits so no dialysis indications.  Will continue to monitor closely with you.  I agree that if numbers improve tomorrow I would be ok with discharge and observation as an OP-  I dont think she need nephrology specific follow up unless her crt is still abnormal 2-3 mos from now 2. Hypertension/volume  -patient does not have a history of hypertension and is not on any blood pressure medications.  Is receiving normal saline with a reasonable volume status so okay to continue another 24 hours-  planning to possibly discharge tomorrow per primary 3.  Urology-status post stenting of ureter on 4/24.   Additional management of this possible mass plan for the future 4. Anemia  -is not a significant issue at this point.  Is decreasing with hydration but not significantly so  I agree with the plan of watching one more day-  if numbers are improved I would be ok with discharge as above-  I will essentially sign off as I feel numbers will be better tomorrow-  call with questions       Cecille Aver    Labs: Basic Metabolic Panel: Recent Labs  Lab 02/13/23 0528 02/13/23 1439 02/14/23 0511  NA 136 136 139  K 5.1 4.5 4.5  CL 102 103 106  CO2 21* 21* 21*  GLUCOSE 161* 144* 120*  BUN 88* 89* 78*  CREATININE 5.53* 4.12* 3.58*  CALCIUM 9.4 9.0 8.5*   Liver Function Tests: Recent Labs  Lab 02/12/23 0954  AST 22  ALT 17  ALKPHOS 64  BILITOT 0.8  PROT 6.5  ALBUMIN 3.5   No results for input(s): "LIPASE", "AMYLASE" in the last 168 hours. No results for input(s): "AMMONIA" in the last 168 hours. CBC: Recent Labs  Lab 02/12/23 0954 02/13/23 0528 02/14/23 0511  WBC 7.4 4.6 10.5  NEUTROABS 4.8  --   --   HGB 11.2* 11.3* 10.5*  HCT 33.2* 33.5* 32.0*  MCV 87.1 89.3 91.2  PLT 185 189 201   Cardiac Enzymes: No results  for input(s): "CKTOTAL", "CKMB", "CKMBINDEX", "TROPONINI" in the last 168 hours. CBG: No results for input(s): "GLUCAP" in the last 168 hours.  Iron Studies: No results for input(s): "IRON", "TIBC", "TRANSFERRIN", "FERRITIN" in the last 72 hours. Studies/Results: DG C-Arm 1-60 Min-No Report  Result Date: 02/12/2023 Fluoroscopy was utilized by the requesting physician.  No radiographic interpretation.   Medications: Infusions:  sodium chloride 75 mL/hr at 02/14/23 1610    Scheduled Medications:  mupirocin ointment   Nasal BID    have reviewed scheduled and prn medications.  Physical Exam: General:  NAD Heart: RRR Lungs: mostly clear Abdomen: obese, soft, non tender Extremities: min edema     02/14/2023,4:22 PM  LOS: 1 day

## 2023-02-14 NOTE — Progress Notes (Signed)
2 Days Post-Op Subjective: Patient looks much better this morning.  She is accompanied by her husband.  No acute events overnight.  She has a very rhonchorous wet cough.  She reports that she has some transient dysphagia in the past but it has not been an ongoing problem.  Planning to ambulate  Objective: Vital signs in last 24 hours: Temp:  [97.7 F (36.5 C)-98.2 F (36.8 C)] 97.7 F (36.5 C) (04/26 0449) Pulse Rate:  [49-64] 49 (04/26 0449) Resp:  [16-20] 20 (04/26 0449) BP: (109-123)/(63-72) 109/67 (04/26 0449) SpO2:  [97 %-100 %] 100 % (04/26 0449)  Intake/Output from previous day: 04/25 0701 - 04/26 0700 In: 999.8 [P.O.:480; I.V.:519.8] Out: 1950 [Urine:1950]  Intake/Output this shift: No intake/output data recorded.  Physical Exam:  General: Alert and oriented CV: RRR, palpable distal pulses Lungs: CTAB, equal chest rise Abdomen: Soft, NTND, no rebound or guarding Ext: NT, No erythema  Lab Results: Recent Labs    02/12/23 0954 02/13/23 0528 02/14/23 0511  HGB 11.2* 11.3* 10.5*  HCT 33.2* 33.5* 32.0*   BMET Recent Labs    02/13/23 1439 02/14/23 0511  NA 136 139  K 4.5 4.5  CL 103 106  CO2 21* 21*  GLUCOSE 144* 120*  BUN 89* 78*  CREATININE 4.12* 3.58*  CALCIUM 9.0 8.5*     Studies/Results: DG C-Arm 1-60 Min-No Report  Result Date: 02/12/2023 Fluoroscopy was utilized by the requesting physician.  No radiographic interpretation.    Assessment/Plan: -POD 2, left ureteral stent placement for obstructing mass at the level of the UVJ, in the context of solitary kidney.  -Plan for cysto and biopsy of obstructing mass in a couple of week with Dr. Laverle Patter.   -Interval improvement of serum creatinine down to 3.58 from peak of 6.52. Continuous IV fluids. Nephrology following.  Mellody Drown rhonchorous cough. Improved today. Recommended continue incentive spirometer and use this more frequently.   LOS: 1 day   Elmon Kirschner, NP [facility]  02/14/2023, 8:55 AM

## 2023-02-15 DIAGNOSIS — N179 Acute kidney failure, unspecified: Secondary | ICD-10-CM | POA: Diagnosis not present

## 2023-02-15 DIAGNOSIS — R34 Anuria and oliguria: Secondary | ICD-10-CM | POA: Diagnosis not present

## 2023-02-15 LAB — CBC
HCT: 32.9 % — ABNORMAL LOW (ref 36.0–46.0)
Hemoglobin: 10.6 g/dL — ABNORMAL LOW (ref 12.0–15.0)
MCH: 29.8 pg (ref 26.0–34.0)
MCHC: 32.2 g/dL (ref 30.0–36.0)
MCV: 92.4 fL (ref 80.0–100.0)
Platelets: 177 10*3/uL (ref 150–400)
RBC: 3.56 MIL/uL — ABNORMAL LOW (ref 3.87–5.11)
RDW: 13.8 % (ref 11.5–15.5)
WBC: 6.2 10*3/uL (ref 4.0–10.5)
nRBC: 0 % (ref 0.0–0.2)

## 2023-02-15 LAB — BASIC METABOLIC PANEL
Anion gap: 10 (ref 5–15)
BUN: 41 mg/dL — ABNORMAL HIGH (ref 6–20)
CO2: 20 mmol/L — ABNORMAL LOW (ref 22–32)
Calcium: 7.8 mg/dL — ABNORMAL LOW (ref 8.9–10.3)
Chloride: 113 mmol/L — ABNORMAL HIGH (ref 98–111)
Creatinine, Ser: 1.44 mg/dL — ABNORMAL HIGH (ref 0.44–1.00)
GFR, Estimated: 42 mL/min — ABNORMAL LOW (ref >60)
Glucose, Bld: 130 mg/dL — ABNORMAL HIGH (ref 70–99)
Potassium: 3.2 mmol/L — ABNORMAL LOW (ref 3.5–5.1)
Sodium: 143 mmol/L (ref 135–145)

## 2023-02-15 MED ORDER — POTASSIUM CHLORIDE CRYS ER 20 MEQ PO TBCR
40.0000 meq | EXTENDED_RELEASE_TABLET | Freq: Once | ORAL | Status: AC
  Administered 2023-02-15: 40 meq via ORAL
  Filled 2023-02-15: qty 2

## 2023-02-15 NOTE — Progress Notes (Signed)
Patient to be discharged to home this afternoon. All discharge instructions including all discharge Medications and schedules for these Medications reviewed with the Patient and the Patient's Husband. Understanding verbalized and discharge AVS with the Patient at time of discharge

## 2023-02-15 NOTE — Discharge Summary (Signed)
Physician Discharge Summary   Patient: Jessica Ritter MRN: [id number] DOB: 13-Aug-1961  Admit date:     02/12/2023  Discharge date: 02/15/23  Discharge Physician: Tyrone Nine   PCP: Everrett Coombe, DO   Recommendations at discharge:  Follow up with PCP in 1-2 weeks with recheck BMP and CBC Follow up with urology, patient with solitary kidney now s/p left ureteral stent for obstructing mass at the level of the UVJ. Plan for outpatient cystoscopy and biopsy with Dr. Laverle Patter.  Discharge Diagnoses: Principal Problem:   Acute renal failure with oliguria (HCC) Active Problems:   Cerebral amyloid angiopathy (HCC)   Hyperlipidemia   GERD (gastroesophageal reflux disease)   Overweight (BMI 25.0-29.9)   Grade I diastolic dysfunction   Hypertension goal BP (blood pressure) < 130/80   History of CVA with residual deficit   Mass of bladder   AKI (acute kidney injury) [facility])   Obstructive uropathy  Hospital Course: Jessica Ritter is a 62 y.o. female with medical history significant for CVA with residual right-sided weakness, RCC s/p right nephrectomy, recent E. coli UTI (finished 7 days antibiotics on 4/7) who had hematuria prompting outpatient CT 4/22 showing left hydroureteronephrosis, distal left ureteral obstructing mass, and presented to the ED with AKI (SCr 5.9), subsequently admitted and had ureteral stent placement 4/24. Renal function has subsequently improved significantly, urine output is good, and oral intake is adequate.   Assessment and Plan: Acute renal failure due to left ureteral obstruction in patient with solitary kidney: Now s/p left retrograde pyelography with ureteral stent 6 x 26 no string on 4/24 by Dr. Laverle Patter.  - Nephrology was following, though renal function has rebounded nicely after relief of obstruction. No follow up recommended with nephrology unless renal function does not normalize.  - Urology planning outpatient cystoscopy and biopsy, follow up with [facility].      Acrochordon:  - Apply mupirocin BID while wound exists as preventive measure   Chronic comorbid conditions, stable Cerebral amyloid angiopathy (HCC)-anticoagulation is contraindicated for this reason Hyperlipidemia -resume atorvastatin at discharge GERD (gastroesophageal reflux disease)-diet controlled Overweight (BMI 25.0-29.9) Grade I diastolic dysfunction-appears euvolemic, not on diuretics Hypertension goal BP (blood pressure) < 130/80-currently at goal History of CVA with residual deficit-appears at baseline per husband. Continue aspirin and statin.  Consultants: Urology, nephrology Procedures performed:  02/12/23 CYSTOSCOPY WITH RETROGRADE PYELOGRAM, URETEROSCOPY AND STENT PLACEMENT Heloise Purpura, MD  Disposition: Home Diet recommendation:  Cardiac and Carb modified diet DISCHARGE MEDICATION: Allergies as of 02/15/2023       Reactions   Anticoagulant Compound Other (See Comments)   Subdural Hematoma   Wasp Venom Protein Swelling   Bee Venom Swelling   Warfarin Other (See Comments)   Amyloid angiopathy.  Hih risk of fatal ICH on treatment dose anticoagulants.  Antiplatelet therapy and DVT prophylaxis is okay.   Ciprofloxacin Nausea And Vomiting        Medication List     STOP taking these medications    AMBULATORY NON FORMULARY MEDICATION   AMBULATORY NON FORMULARY MEDICATION       TAKE these medications    aspirin EC 81 MG tablet Take 81 mg by mouth daily.   atorvastatin 80 MG tablet Commonly known as: LIPITOR TAKE 1 TABLET BY MOUTH ONCE DAILY AT  6  PM. What changed:  how much to take how to take this when to take this additional instructions   Calcium Carbonate-Vitamin D 600-400 MG-UNIT tablet Take 1 tablet by mouth 2 (two) times daily.  valACYclovir 500 MG tablet Commonly known as: VALTREX TAKE 1 TABLET BY MOUTH TWICE DAILY FOR 3 DAYS AS NEEDED FOR  OUTBREAKS What changed: See the new instructions.        Follow-up Information      Care, [facility] Follow up.   Specialty: Home Health Services Why: [facility] physical therapy/social worker Contact information: [address] [place] [address] [phone]         Everrett Coombe, DO Follow up.   Specialty: Family Medicine Contact information: [address]  [address] [place] [address] [phone]         Heloise Purpura, MD. Schedule an appointment as soon as possible for a visit in 1 week(s).   Specialty: Urology Contact information: [address] [place] [address] [phone]                Discharge Exam: BP 119/70 (BP Location: Right Arm)   Pulse 61   Temp 97.6 F (36.4 C) (Oral)   Resp 18   Ht 5\' 6"  (1.676 m)   Wt 74.8 kg   SpO2 99%   BMI 26.63 kg/m   Gen: No distress Pulm: Clear, nonlabored  CV: RRR, no MRG or edema GI: Soft, NT, ND, +BS  Condition at discharge: stable  The results of significant diagnostics from this hospitalization (including imaging, microbiology, ancillary and laboratory) are listed below for reference.   Imaging Studies: DG C-Arm 1-60 Min-No Report  Result Date: 02/12/2023 Fluoroscopy was utilized by the requesting physician.  No radiographic interpretation.   CT ABDOMEN PELVIS W WO CONTRAST  Result Date: 02/12/2023 CLINICAL DATA:  Gross hematuria. EXAM: CT ABDOMEN AND PELVIS WITHOUT AND WITH CONTRAST TECHNIQUE: Multidetector CT imaging of the abdomen and pelvis was performed following the standard protocol before and following the bolus administration of intravenous contrast. RADIATION DOSE REDUCTION: This exam was performed according to the departmental dose-optimization program which includes automated exposure control, adjustment of the mA and/or kV according to patient size and/or use of iterative reconstruction technique. CONTRAST:  OMNIPAQUE IOHEXOL 300 MG/ML  SOLN COMPARISON:  None Available. FINDINGS: Lower chest: Unremarkable Hepatobiliary: Multiple  hypoenhancing lesions are seen scattered through both hepatic lobes. Dominant lesions in the left liver measure up to 15 mm and approach water attenuation, likely cysts. Multiple lesions are much too small to characterize but are statistically benign. No followup imaging is recommended. Gallbladder is nondistended. No intrahepatic or extrahepatic biliary dilation. Pancreas: No focal mass lesion. No dilatation of the main duct. No intraparenchymal cyst. No peripancreatic edema. Spleen: No splenomegaly. No focal mass lesion. Adrenals/Urinary Tract: No adrenal nodule or mass. Right kidney surgically absent. Mild to moderate left-sided hydronephrosis evident with tiny hypoattenuating left renal lesions, too small to characterize but likely benign. There is associated mild to moderate left hydroureter down to the pelvis where a focus of ureteral narrowing and circumferential wall enhancement is identified along the pelvic sidewall (axial image 71/6). Ureter then distends before narrowing again just proximal to the UVJ (78/6 and coronal 29/8). There is diffuse enhancement of the distal left ureter at the UVJ. 2 enhancing nodules are seen along the posterior bladder wall mucosa including 8 mm enhancing nodule on image 80/6 and 8 mm nodule on 79/6. These are both probably part of a larger irregular multinodular posterior bladder wall lesion. Stomach/Bowel: Stomach is unremarkable. No gastric wall thickening. No evidence of outlet obstruction. Duodenum is normally positioned as is the ligament of Treitz.  No small bowel wall thickening. No small bowel dilatation. The terminal ileum is normal. The appendix is normal. No gross colonic mass. No colonic wall thickening. Diverticuli are seen scattered along the entire length of the colon without CT findings of diverticulitis. Vascular/Lymphatic: There is mild atherosclerotic calcification of the abdominal aorta without aneurysm. There is no gastrohepatic or hepatoduodenal ligament  lymphadenopathy. 10 mm short axis low left para-aortic node is identified at the aortic bifurcation (46/6) 11 mm short axis left common iliac node seen on 51/6. The left para-aortic and common iliac lymphadenopathy is well demonstrated on coronal images 42, 38, and 36 of series 8. Another 11 mm short axis left common iliac node is seen on 53/6. Reproductive: Unremarkable. Other: No intraperitoneal free fluid. Musculoskeletal: Sequelae of prior right groin hernia repair with mesh placement. Mix lucent and sclerotic lesion identified in the inter trochanteric region of the proximal left femur, indeterminate. IMPRESSION: 1. Mild to moderate left-sided hydroureteronephrosis down to the pelvis where a focus of ureteral narrowing and circumferential wall thickening/enhancement is identified just proximal to and extending into the UVJ. At least 2 enhancing nodules are seen along the posterior bladder wall mucosa near the left UVJ, likely components of a larger mucosal/bladder wall lesion. Imaging features are highly suspicious for urothelial neoplasm. 2. Mild left para-aortic and common iliac lymphadenopathy, suspicious for metastatic disease. 3. Mix lucent and sclerotic lesion identified in the intertrochanteric region of the proximal left femur, indeterminate. This does not appear overtly suspicious for metastatic lesion. Comparison to prior imaging studies recommended and be the most helpful means to further evaluate and establish chronicity. If this is a new finding, metastatic disease would be a greater concern. 4. Status post right nephrectomy. 5. Multiple hypoenhancing lesions scattered through both hepatic lobes. Dominant lesions in the left liver measure up to 15 mm and approach water attenuation, likely cysts. Multiple lesions are much too small to characterize but are statistically benign. Given the above described findings, attention on follow-up imaging recommended. 6.  Aortic Atherosclerosis (ICD10-I70.0).  These results will be called to the ordering clinician or representative by the Radiologist Assistant, and communication documented in the PACS or [employer]. Electronically Signed   By: Kennith Center M.D.   On: 02/12/2023 09:09    Microbiology: Results for orders placed or performed during the hospital encounter of 02/12/23  Surgical PCR screen     Status: None   Collection Time: 02/12/23  3:33 PM   Specimen: Nasal Mucosa; Nasal Swab  Result Value Ref Range Status   MRSA, PCR NEGATIVE NEGATIVE Final   Staphylococcus aureus NEGATIVE NEGATIVE Final    Comment: (NOTE) The Xpert SA Assay (FDA approved for NASAL specimens in patients 67 years of age and older), is one component of a comprehensive surveillance program. It is not intended to diagnose infection nor to guide or monitor treatment. Performed at [facility], [address] [place], [place] [address]     Labs: CBC: Recent Labs  Lab 02/12/23 [phone] 02/13/23 0528 02/14/23 0511 02/15/23 1115  WBC 7.4 4.6 10.5 6.2  NEUTROABS 4.8  --   --   --   HGB 11.2* 11.3* 10.5* 10.6*  HCT 33.2* 33.5* 32.0* 32.9*  MCV 87.1 89.3 91.2 92.4  PLT 185 189 201 177   Basic Metabolic Panel: Recent Labs  Lab 02/12/23 1540 02/13/23 0528 02/13/23 1439 02/14/23 0511 02/15/23 1115  NA 133* 136 136 139 143  K 4.5 5.1 4.5 4.5 3.2*  CL 98  102 103 106 113*  CO2 22 21* 21* 21* 20*  GLUCOSE 90 161* 144* 120* 130*  BUN 88* 88* 89* 78* 41*  CREATININE 6.25* 5.53* 4.12* 3.58* 1.44*  CALCIUM 9.3 9.4 9.0 8.5* 7.8*   Liver Function Tests: Recent Labs  Lab 02/12/23 0954  AST 22  ALT 17  ALKPHOS 64  BILITOT 0.8  PROT 6.5  ALBUMIN 3.5   CBG: No results for input(s): "GLUCAP" in the last 168 hours.  Discharge time spent: greater than 30 minutes.  Signed: Tyrone Nine, MD [facility] 02/15/2023

## 2023-02-15 NOTE — Evaluation (Signed)
Occupational Therapy Evaluation Patient Details Name: Jessica Ritter MRN: [id number] DOB: December 18, 1960 Today's Date: 02/15/2023   History of Present Illness Jessica Ritter is a 62 y.o. female with medical history significant for CVA with residual right-sided weakness, malignant neoplasm of the right kidney, neoplasm of brain, status post nephrectomy who is being admitted 02/12/23  to the hospital with acute oliguric renal failure. S/P1.  Cystoscopy  2.  Left retrograde pyelography with interpretation  3.  Left ureteroscopy  4.  Left ureteral stent placement on 4/24.   Clinical Impression   Patient is a 62 year old female who was admitted for above. Patient was living at home with husband supervision for ADLs at home. Currently, patient is max A for LB dressing/hygiene tasks. Patient was noted to have decreased functional activity tolerance, decreased endurance, decreased standing balance, decreased safety awareness, and decreased knowledge of AD/AE impacting participation in ADLs. Patient would continue to benefit from skilled OT services at this time while admitted and after d/c to address noted deficits in order to improve overall safety and independence in ADLs.       Recommendations for follow up therapy are one component of a multi-disciplinary discharge planning process, led by the attending physician.  Recommendations may be updated based on patient status, additional functional criteria and insurance authorization.   Assistance Recommended at Discharge Frequent or constant Supervision/Assistance  Patient can return home with the following A little help with walking and/or transfers;A little help with bathing/dressing/bathroom;Assistance with cooking/housework;Direct supervision/assist for medications management;Assist for transportation;Help with stairs or ramp for entrance;Direct supervision/assist for financial management    Functional Status Assessment  Patient has had a recent decline in  their functional status and demonstrates the ability to make significant improvements in function in a reasonable and predictable amount of time.  Equipment Recommendations  None recommended by OT       Precautions / Restrictions Precautions Precautions: Fall Restrictions Weight Bearing Restrictions: No      Mobility Bed Mobility Overal bed mobility: Needs Assistance Bed Mobility: Supine to Sit     Supine to sit: Min guard     General bed mobility comments: with increased time and HOB near flat             Balance Overall balance assessment: Needs assistance, History of Falls Sitting-balance support: Bilateral upper extremity supported, Feet supported Sitting balance-Leahy Scale: Fair     Standing balance support: Bilateral upper extremity supported, During functional activity, Reliant on assistive device for balance Standing balance-Leahy Scale: Poor         ADL either performed or assessed with clinical judgement   ADL Overall ADL's : Needs assistance/impaired Eating/Feeding: Modified independent;Sitting;Set up   Grooming: Set up;Supervision/safety;Sitting   Upper Body Bathing: Set up;Sitting;Supervision/ safety   Lower Body Bathing: Minimal assistance;Sit to/from stand;Sitting/lateral leans   Upper Body Dressing : Set up;Min guard;Sitting   Lower Body Dressing: Maximal assistance;Sit to/from stand Lower Body Dressing Details (indicate cue type and reason): to doff adult absorbent undergarments and don new pain with BUE on RW to maintain standing balance. Toilet Transfer: Minimal assistance;Ambulation;Rolling walker (2 wheels) Toilet Transfer Details (indicate cue type and reason): patient needed min A intial standing with patient having posterior leaning. then patient was able to progress to min A to help steering the RW with patient needing to speak aloud on which foot was moving to make good steps. husband present and very supportive of patient. Toileting-  Clothing Manipulation and Hygiene: Maximal assistance;Sit to/from stand Toileting -  Clothing Manipulation Details (indicate cue type and reason): patient needed BUE support on RW to maintain standing balance.                          Pertinent Vitals/Pain Pain Assessment Pain Assessment: No/denies pain     Hand Dominance Right   Extremity/Trunk Assessment Upper Extremity Assessment Upper Extremity Assessment: Overall WFL for tasks assessed;LUE deficits/detail LUE Deficits / Details: noted to have redness on posterior side of proximal forearm with redness, dry skin and heat. husband in room reported MD is aware. this site is proximal to distal IV site on posteiror wrist.       Cervical / Trunk Assessment Cervical / Trunk Assessment: Normal   Communication Communication Communication: Expressive difficulties;Receptive difficulties   Cognition Arousal/Alertness: Awake/alert Behavior During Therapy: WFL for tasks assessed/performed Overall Cognitive Status: Difficult to assess       General Comments: able to answer simple questions, spouse mostly answers for patient although he does give her time to answer as able.                Home Living Family/patient expects to be discharged to:: Private residence Living Arrangements: Spouse/significant other Available Help at Discharge: Family;Available PRN/intermittently Type of Home: House Home Access: Level entry     Home Layout: One level     Bathroom Shower/Tub: Walk-in shower         Home Equipment: Agricultural consultant (2 wheels);Rollator (4 wheels);Wheelchair - manual          Prior Functioning/Environment Prior Level of Function : Needs assist;History of Falls (last six months)  Cognitive Assist : Mobility (cognitive);ADLs (cognitive)   ADLs (Cognitive): Step by step cues Physical Assist : Mobility (physical);ADLs (physical) Mobility (physical): Bed mobility;Transfers;Gait ADLs (physical):  Grooming;Bathing;Dressing;Toileting Mobility Comments: spouse assists with ambulation using RW, patient is not to amb when spouse gone. ADLs Comments: spouse assists with bath.        OT Problem List: Decreased activity tolerance;Impaired balance (sitting and/or standing);Decreased coordination;Decreased safety awareness;Decreased knowledge of precautions      OT Treatment/Interventions: Self-care/ADL training;Energy conservation;Therapeutic exercise;DME and/or AE instruction;Therapeutic activities;Patient/family education;Balance training    OT Goals(Current goals can be found in the care plan section) Acute Rehab OT Goals Patient Stated Goal: to go home OT Goal Formulation: With patient/family Time For Goal Achievement: 03/01/23 Potential to Achieve Goals: Fair  OT Frequency: Min 2X/week       AM-PAC OT "6 Clicks" Daily Activity     Outcome Measure Help from another person eating meals?: A Little Help from another person taking care of personal grooming?: A Little Help from another person toileting, which includes using toliet, bedpan, or urinal?: A Lot Help from another person bathing (including washing, rinsing, drying)?: A Lot Help from another person to put on and taking off regular upper body clothing?: A Little Help from another person to put on and taking off regular lower body clothing?: A Lot 6 Click Score: 15   End of Session Equipment Utilized During Treatment: Gait belt;Rolling walker (2 wheels) Nurse Communication: Other (comment) (ok to participate in session, redness/rash in LUE)  Activity Tolerance: Patient tolerated treatment well Patient left: in bed;with call bell/phone within reach;with family/visitor present  OT Visit Diagnosis: Other abnormalities of gait and mobility (R26.89);Unsteadiness on feet (R26.81);Muscle weakness (generalized) (M62.81)                Time: 1914-7829 OT Time Calculation (min): 38 min Charges:  OT  General Charges $OT Visit: 1  Visit OT Evaluation $OT Eval Low Complexity: 1 Low OT Treatments $Self Care/Home Management : 23-37 mins  Jermie Hippe OTR/L, MS Acute Rehabilitation Department Office# [phone]   Selinda Flavin 02/15/2023, 1:05 PM

## 2023-02-15 NOTE — TOC Transition Note (Signed)
Transition of Care [facility]) - CM/SW Discharge Note   Patient Details  Name: Jessica Ritter MRN: [id number] Date of Birth: 1961-04-06  [facility] CM/SW Contact:  Lanier Clam, RN Phone Number: 02/15/2023, 1:17 PM   Clinical Narrative: Per TOC yesterdays note. Already set up for [facility]. No further CM needs.      Final next level of care: Home w Home Health Services Barriers to Discharge: No Barriers Identified   Patient Goals and CMS Choice      Discharge Placement                         Discharge Plan and Services Additional resources added to the After Visit Summary for     Discharge Planning Services: CM Consult Post Acute Care Choice: Home Health                               Social Determinants of Health (SDOH) Interventions SDOH Screenings   Food Insecurity: No Food Insecurity (02/12/2023)  Housing: Low Risk  (02/12/2023)  Transportation Needs: No Transportation Needs (02/12/2023)  Utilities: Not At Risk (02/12/2023)  Depression (PHQ2-9): Low Risk  (08/23/2022)  Tobacco Use: Medium Risk (02/13/2023)     Readmission Risk Interventions     No data to display

## 2023-02-17 ENCOUNTER — Telehealth: Payer: Self-pay | Admitting: *Deleted

## 2023-02-17 ENCOUNTER — Encounter: Payer: Self-pay | Admitting: *Deleted

## 2023-02-17 DIAGNOSIS — I69351 Hemiplegia and hemiparesis following cerebral infarction affecting right dominant side: Secondary | ICD-10-CM | POA: Diagnosis not present

## 2023-02-17 DIAGNOSIS — I69354 Hemiplegia and hemiparesis following cerebral infarction affecting left non-dominant side: Secondary | ICD-10-CM | POA: Diagnosis not present

## 2023-02-17 DIAGNOSIS — G8929 Other chronic pain: Secondary | ICD-10-CM | POA: Diagnosis not present

## 2023-02-17 DIAGNOSIS — C679 Malignant neoplasm of bladder, unspecified: Secondary | ICD-10-CM | POA: Diagnosis not present

## 2023-02-17 DIAGNOSIS — E854 Organ-limited amyloidosis: Secondary | ICD-10-CM | POA: Diagnosis not present

## 2023-02-17 DIAGNOSIS — I680 Cerebral amyloid angiopathy: Secondary | ICD-10-CM | POA: Diagnosis not present

## 2023-02-17 DIAGNOSIS — M549 Dorsalgia, unspecified: Secondary | ICD-10-CM | POA: Diagnosis not present

## 2023-02-17 DIAGNOSIS — G4730 Sleep apnea, unspecified: Secondary | ICD-10-CM | POA: Diagnosis not present

## 2023-02-17 DIAGNOSIS — E785 Hyperlipidemia, unspecified: Secondary | ICD-10-CM | POA: Diagnosis not present

## 2023-02-17 DIAGNOSIS — I700 Atherosclerosis of aorta: Secondary | ICD-10-CM | POA: Diagnosis not present

## 2023-02-17 DIAGNOSIS — N179 Acute kidney failure, unspecified: Secondary | ICD-10-CM | POA: Diagnosis not present

## 2023-02-17 DIAGNOSIS — K219 Gastro-esophageal reflux disease without esophagitis: Secondary | ICD-10-CM | POA: Diagnosis not present

## 2023-02-17 DIAGNOSIS — N139 Obstructive and reflux uropathy, unspecified: Secondary | ICD-10-CM | POA: Diagnosis not present

## 2023-02-17 DIAGNOSIS — I119 Hypertensive heart disease without heart failure: Secondary | ICD-10-CM | POA: Diagnosis not present

## 2023-02-17 DIAGNOSIS — I69322 Dysarthria following cerebral infarction: Secondary | ICD-10-CM | POA: Diagnosis not present

## 2023-02-17 DIAGNOSIS — D630 Anemia in neoplastic disease: Secondary | ICD-10-CM | POA: Diagnosis not present

## 2023-02-17 NOTE — Transitions of Care (Post Inpatient/ED Visit) (Signed)
   02/17/2023  Name: Jessica Ritter MRN: [id number] DOB: 1961-02-19  Today's TOC FU Call Status: Today's TOC FU Call Status:: Unsuccessul Call (1st Attempt) Unsuccessful Call (1st Attempt) Date: 02/17/23  Attempted to reach the patient regarding the most recent Inpatient visit; left HIPAA compliant voice message requesting call back  Follow Up Plan: Additional outreach attempts will be made to reach the patient to complete the Transitions of Care (Post Inpatient visit) call.   Caryl Pina, RN, BSN, CCRN Alumnus RN CM Care Coordination/ Transition of Care- [facility] Care Management [phone]: direct office

## 2023-02-18 ENCOUNTER — Encounter: Payer: Self-pay | Admitting: *Deleted

## 2023-02-18 ENCOUNTER — Telehealth: Payer: Self-pay | Admitting: *Deleted

## 2023-02-18 NOTE — Transitions of Care (Post Inpatient/ED Visit) (Signed)
   02/18/2023  Name: Jessica Ritter MRN: [id number] DOB: 14-May-1961  Today's TOC FU Call Status: Today's TOC FU Call Status:: Unsuccessful Call (2nd Attempt) Unsuccessful Call (2nd Attempt) Date: 02/18/23  Attempted to reach the patient regarding the most recent Inpatient visit; left HIPAA compliant voice message requesting call back  Follow Up Plan: Additional outreach attempts will be made to reach the patient to complete the Transitions of Care (Post Inpatient visit) call.   Caryl Pina, RN, BSN, CCRN Alumnus RN CM Care Coordination/ Transition of Care- [facility] Care Management [phone]: direct office

## 2023-02-19 ENCOUNTER — Encounter: Payer: Self-pay | Admitting: *Deleted

## 2023-02-19 ENCOUNTER — Telehealth: Payer: Self-pay | Admitting: *Deleted

## 2023-02-19 NOTE — Transitions of Care (Post Inpatient/ED Visit) (Addendum)
   02/19/2023  Name: Zendayah Hardgrave MRN: [id number] DOB: 03/05/1961  Today's TOC FU Call Status: Today's TOC FU Call Status:: Unsuccessful Call (3rd Attempt) Unsuccessful Call (3rd Attempt) Date: 02/19/23  Attempted to reach the patient regarding the most recent Inpatient visit; left HIPAA compliant voice message requesting call back  Patient's husband returned call after earlier call this morning- left voice message stating patient was doing well; attempted call back for TOC, left second voice message for attempt # 3, requesting call back   Follow Up Plan: No further outreach attempts will be made at this time. We have been unable to contact the patient.  Caryl Pina, RN, BSN, CCRN Alumnus RN CM Care Coordination/ Transition of Care- [facility] Care Management [phone]: direct office

## 2023-02-20 ENCOUNTER — Encounter: Payer: Medicare Other | Admitting: Urology

## 2023-02-21 ENCOUNTER — Encounter: Payer: Self-pay | Admitting: Family Medicine

## 2023-02-21 ENCOUNTER — Other Ambulatory Visit: Payer: Self-pay | Admitting: Urology

## 2023-02-21 ENCOUNTER — Ambulatory Visit (INDEPENDENT_AMBULATORY_CARE_PROVIDER_SITE_OTHER): Payer: Medicare Other | Admitting: Family Medicine

## 2023-02-21 VITALS — BP 108/78 | HR 99 | Ht 66.0 in | Wt 162.0 lb

## 2023-02-21 DIAGNOSIS — G8929 Other chronic pain: Secondary | ICD-10-CM | POA: Diagnosis not present

## 2023-02-21 DIAGNOSIS — D630 Anemia in neoplastic disease: Secondary | ICD-10-CM | POA: Diagnosis not present

## 2023-02-21 DIAGNOSIS — M5441 Lumbago with sciatica, right side: Secondary | ICD-10-CM | POA: Diagnosis not present

## 2023-02-21 DIAGNOSIS — I69322 Dysarthria following cerebral infarction: Secondary | ICD-10-CM | POA: Diagnosis not present

## 2023-02-21 DIAGNOSIS — I680 Cerebral amyloid angiopathy: Secondary | ICD-10-CM | POA: Diagnosis not present

## 2023-02-21 DIAGNOSIS — I119 Hypertensive heart disease without heart failure: Secondary | ICD-10-CM | POA: Diagnosis not present

## 2023-02-21 DIAGNOSIS — I700 Atherosclerosis of aorta: Secondary | ICD-10-CM | POA: Diagnosis not present

## 2023-02-21 DIAGNOSIS — E785 Hyperlipidemia, unspecified: Secondary | ICD-10-CM | POA: Diagnosis not present

## 2023-02-21 DIAGNOSIS — E854 Organ-limited amyloidosis: Secondary | ICD-10-CM | POA: Diagnosis not present

## 2023-02-21 DIAGNOSIS — I69351 Hemiplegia and hemiparesis following cerebral infarction affecting right dominant side: Secondary | ICD-10-CM | POA: Diagnosis not present

## 2023-02-21 DIAGNOSIS — N139 Obstructive and reflux uropathy, unspecified: Secondary | ICD-10-CM | POA: Diagnosis not present

## 2023-02-21 DIAGNOSIS — I69354 Hemiplegia and hemiparesis following cerebral infarction affecting left non-dominant side: Secondary | ICD-10-CM | POA: Diagnosis not present

## 2023-02-21 DIAGNOSIS — M549 Dorsalgia, unspecified: Secondary | ICD-10-CM | POA: Diagnosis not present

## 2023-02-21 DIAGNOSIS — K219 Gastro-esophageal reflux disease without esophagitis: Secondary | ICD-10-CM | POA: Diagnosis not present

## 2023-02-21 DIAGNOSIS — N179 Acute kidney failure, unspecified: Secondary | ICD-10-CM | POA: Diagnosis not present

## 2023-02-21 DIAGNOSIS — C679 Malignant neoplasm of bladder, unspecified: Secondary | ICD-10-CM | POA: Diagnosis not present

## 2023-02-21 DIAGNOSIS — G4730 Sleep apnea, unspecified: Secondary | ICD-10-CM | POA: Diagnosis not present

## 2023-02-21 LAB — CBC WITH DIFFERENTIAL/PLATELET
Absolute Monocytes: 693 cells/uL (ref 200–950)
Eosinophils Relative: 4.5 %
HCT: 37.7 % (ref 35.0–45.0)
Hemoglobin: 12.4 g/dL (ref 11.7–15.5)
Lymphs Abs: 2287 cells/uL (ref 850–3900)
MPV: 10 fL (ref 7.5–12.5)
Monocytes Relative: 7 %
Platelets: 252 10*3/uL (ref 140–400)
RBC: 4.19 10*6/uL (ref 3.80–5.10)
RDW: 13.1 % (ref 11.0–15.0)

## 2023-02-21 MED ORDER — TRAMADOL-ACETAMINOPHEN 37.5-325 MG PO TABS: 1.0000 | ORAL_TABLET | Freq: Three times a day (TID) | ORAL | 1 refills | Status: DC | PRN

## 2023-02-21 NOTE — Assessment & Plan Note (Signed)
Improved with meloxicam previously but avoiding due to AKI.  Adding ultracet as needed

## 2023-02-21 NOTE — Progress Notes (Signed)
Jessica Ritter - 62 y.o. female MRN [id number]  Date of birth: 10-28-1960  Subjective Chief Complaint  Patient presents with   Follow-up    Questions about anti inflammatory medication and organ function,pt is requesting 90 day refills    HPI Jessica Ritter is a 62 y.o. female here today for hospital follow up.    She was seen previously for UTI which was treated.  Developed hematuria shortly after treatment.  Urine re-collected and culture negative.  Urology referral placed and CT abdomen/pelvis ordered showing ureteral nodule/mass creating stricture.  Bladder lesion noted as well. She became oliguric a couple of days later and was referred to the ED.  She was admitted from 02/12/23-02/15/23.  She was noted to have AKI and was taken to OR where  ureteral stent was placed.  She was hydrated aggressively and her renal function rebounded nicely.  She was discharged in stable condition with improving renal function and plan to follow up with PCP and Urology.  TOC call placed 02/19/23 to schedule with PCP. She reports that she is doing much better.  She is making a good amount of urine.  Minimal blood in urine.  She has follow up with urology next week. Will need biopsy of ureteral and/or bladder lesion.   ROS:  A comprehensive ROS was completed and negative except as noted per HPI  Allergies  Allergen Reactions   Anticoagulant Compound Other (See Comments)    Subdural Hematoma   Wasp Venom Protein Swelling   Bee Venom Swelling   Warfarin Other (See Comments)    Amyloid angiopathy.  Hih risk of fatal ICH on treatment dose anticoagulants.  Antiplatelet therapy and DVT prophylaxis is okay.   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/2017    Gait disturbance, post-stroke 04/03/2018   History of lower GI bleeding 05/2018   History of malignant neoplasm metastatic to lung 2002   History of malignant neoplasm of brain 2003   secondary, renal   History of malignant neoplasm of renal pelvis 1998   History of multiple pulmonary nodules 2002   Hyperlipidemia    Hypertension    Seizures (HCC) 2016   tapered off of Keppra 06/2018   Sleep apnea    Subdural hematoma caused by concussion (HCC) 04/2014    Past Surgical History:  Procedure Laterality Date   BRAIN SURGERY     brain tumor surgery     COLONOSCOPY W/ POLYPECTOMY  05/2018   CYSTOSCOPY WITH RETROGRADE PYELOGRAM, URETEROSCOPY AND STENT PLACEMENT Left 02/12/2023   Procedure: CYSTOSCOPY WITH RETROGRADE PYELOGRAM, URETEROSCOPY AND STENT PLACEMENT;  Surgeon: Heloise Purpura, MD;  Location: [facility];  Service: Urology;  Laterality: Left;   KIDNEY SURGERY     LEG SURGERY     LUNG LOBECTOMY Right 2002   RLL    Social History   Socioeconomic History   Marital status: Married    Spouse name: Not on file   Number of children: Not on file   Years of education: Not on file   Highest education level: Not on file  Occupational History   Not on file  Tobacco Use   Smoking status: Former    Types: Cigarettes    Quit date: 07/21/1997  Years since quitting: 25.6   Smokeless tobacco: Never  Vaping Use   Vaping Use: Never used  Substance and Sexual Activity   Alcohol use: Yes    Alcohol/week: 4.0 standard drinks of alcohol    Types: 2 Glasses of wine, 2 Standard drinks or equivalent per week   Drug use: No   Sexual activity: Not Currently    Partners: Male  Other Topics Concern   Not on file  Social History Narrative   Not on file   Social Determinants of Health   Financial Resource Strain: Not on file  Food Insecurity: No Food Insecurity (02/12/2023)   Hunger Vital Sign    Worried About Running Out of Food in the Last Year: Never true    Ran Out of Food in the Last  Year: Never true  Transportation Needs: No Transportation Needs (02/12/2023)   PRAPARE - Administrator, Civil Service (Medical): No    Lack of Transportation (Non-Medical): No  Physical Activity: Not on file  Stress: Not on file  Social Connections: Not on file    Family History  Problem Relation Age of Onset   Cancer Other        mother     Health Maintenance  Topic Date Due   PAP SMEAR-Modifier  07/19/2023 (Originally 10/01/2022)   COVID-19 Vaccine (3 - Pfizer risk series) 07/19/2023 (Originally 02/28/2020)   Medicare Annual Wellness (AWV)  07/19/2023 (Originally 07/12/1961)   Zoster Vaccines- Shingrix (1 of 2) 07/19/2023 (Originally 09/30/1981)   MAMMOGRAM  02/21/2024 (Originally 07/27/2021)   Hepatitis C Screening  02/21/2024 (Originally 09/30/1980)   INFLUENZA VACCINE  05/22/2023   COLONOSCOPY (Pts 45-5yrs Insurance coverage will need to be confirmed)  06/19/2023   DTaP/Tdap/Td (2 - Td or Tdap) 07/03/2027   HPV VACCINES  Aged Out   HIV Screening  Discontinued     ----------------------------------------------------------------------------------------------------------------------------------------------------------------------------------------------------------------- Physical Exam BP 108/78   Pulse 99   Ht 5\' 6"  (1.676 m)   Wt 162 lb (73.5 kg)   SpO2 100%   BMI 26.15 kg/m   Physical Exam Constitutional:      Appearance: Normal appearance.  HENT:     Head: Normocephalic and atraumatic.  Eyes:     General: No scleral icterus. Cardiovascular:     Rate and Rhythm: Normal rate and regular rhythm.  Pulmonary:     Effort: Pulmonary effort is normal.     Breath sounds: Normal breath sounds.  Neurological:     Mental Status: She is alert.  Psychiatric:        Mood and Affect: Mood normal.        Behavior: Behavior normal.      ------------------------------------------------------------------------------------------------------------------------------------------------------------------------------------------------------------------- Assessment and Plan  Obstructive uropathy Doing much better since stent placement.  Rechecking renal function today.  She has follow up with urology.    Low back pain Improved with meloxicam previously but avoiding due to AKI.  Adding ultracet as needed   Meds ordered this encounter  Medications   traMADol-acetaminophen (ULTRACET) 37.5-325 MG tablet    Sig: Take 1 tablet by mouth every 8 (eight) hours as needed.    Dispense:  15 tablet    Refill:  1    Return in about 4 months (around 06/24/2023) for F/u Renal function/Pain.    This visit occurred during the SARS-CoV-2 public health emergency.  Safety protocols were in place, including screening questions prior to the visit, additional usage of staff PPE, and extensive cleaning of exam room while observing  appropriate contact time as indicated for disinfecting solutions.

## 2023-02-21 NOTE — Assessment & Plan Note (Signed)
Doing much better since stent placement.  Rechecking renal function today.  She has follow up with urology.

## 2023-02-21 NOTE — Patient Instructions (Signed)
Try ultracet for pain control.  See me again in 4 months.

## 2023-02-22 LAB — CBC WITH DIFFERENTIAL/PLATELET
Basophils Absolute: 89 cells/uL (ref 0–200)
Basophils Relative: 0.9 %
Eosinophils Absolute: 446 cells/uL (ref 15–500)
MCH: 29.6 pg (ref 27.0–33.0)
MCHC: 32.9 g/dL (ref 32.0–36.0)
MCV: 90 fL (ref 80.0–100.0)
Neutro Abs: 6386 cells/uL (ref 1500–7800)
Neutrophils Relative %: 64.5 %
Total Lymphocyte: 23.1 %
WBC: 9.9 10*3/uL (ref 3.8–10.8)

## 2023-02-22 LAB — BASIC METABOLIC PANEL
BUN/Creatinine Ratio: 23 (calc) — ABNORMAL HIGH (ref 6–22)
BUN: 32 mg/dL — ABNORMAL HIGH (ref 7–25)
CO2: 30 mmol/L (ref 20–32)
Calcium: 10.9 mg/dL — ABNORMAL HIGH (ref 8.6–10.4)
Chloride: 102 mmol/L (ref 98–110)
Creat: 1.38 mg/dL — ABNORMAL HIGH (ref 0.50–1.05)
Glucose, Bld: 99 mg/dL (ref 65–99)
Potassium: 4.2 mmol/L (ref 3.5–5.3)
Sodium: 142 mmol/L (ref 135–146)

## 2023-02-26 DIAGNOSIS — N179 Acute kidney failure, unspecified: Secondary | ICD-10-CM | POA: Diagnosis not present

## 2023-02-26 DIAGNOSIS — I69351 Hemiplegia and hemiparesis following cerebral infarction affecting right dominant side: Secondary | ICD-10-CM | POA: Diagnosis not present

## 2023-02-26 DIAGNOSIS — D630 Anemia in neoplastic disease: Secondary | ICD-10-CM | POA: Diagnosis not present

## 2023-02-26 DIAGNOSIS — I680 Cerebral amyloid angiopathy: Secondary | ICD-10-CM | POA: Diagnosis not present

## 2023-02-26 DIAGNOSIS — G4730 Sleep apnea, unspecified: Secondary | ICD-10-CM | POA: Diagnosis not present

## 2023-02-26 DIAGNOSIS — G8929 Other chronic pain: Secondary | ICD-10-CM | POA: Diagnosis not present

## 2023-02-26 DIAGNOSIS — I700 Atherosclerosis of aorta: Secondary | ICD-10-CM | POA: Diagnosis not present

## 2023-02-26 DIAGNOSIS — I69354 Hemiplegia and hemiparesis following cerebral infarction affecting left non-dominant side: Secondary | ICD-10-CM | POA: Diagnosis not present

## 2023-02-26 DIAGNOSIS — K219 Gastro-esophageal reflux disease without esophagitis: Secondary | ICD-10-CM | POA: Diagnosis not present

## 2023-02-26 DIAGNOSIS — E854 Organ-limited amyloidosis: Secondary | ICD-10-CM | POA: Diagnosis not present

## 2023-02-26 DIAGNOSIS — I69322 Dysarthria following cerebral infarction: Secondary | ICD-10-CM | POA: Diagnosis not present

## 2023-02-26 DIAGNOSIS — N139 Obstructive and reflux uropathy, unspecified: Secondary | ICD-10-CM | POA: Diagnosis not present

## 2023-02-26 DIAGNOSIS — C679 Malignant neoplasm of bladder, unspecified: Secondary | ICD-10-CM | POA: Diagnosis not present

## 2023-02-26 DIAGNOSIS — I119 Hypertensive heart disease without heart failure: Secondary | ICD-10-CM | POA: Diagnosis not present

## 2023-02-26 DIAGNOSIS — M549 Dorsalgia, unspecified: Secondary | ICD-10-CM | POA: Diagnosis not present

## 2023-02-26 DIAGNOSIS — E785 Hyperlipidemia, unspecified: Secondary | ICD-10-CM | POA: Diagnosis not present

## 2023-02-27 ENCOUNTER — Encounter: Payer: Medicare Other | Admitting: Urology

## 2023-02-28 DIAGNOSIS — I700 Atherosclerosis of aorta: Secondary | ICD-10-CM | POA: Diagnosis not present

## 2023-02-28 DIAGNOSIS — C679 Malignant neoplasm of bladder, unspecified: Secondary | ICD-10-CM | POA: Diagnosis not present

## 2023-02-28 DIAGNOSIS — M549 Dorsalgia, unspecified: Secondary | ICD-10-CM | POA: Diagnosis not present

## 2023-02-28 DIAGNOSIS — G8929 Other chronic pain: Secondary | ICD-10-CM | POA: Diagnosis not present

## 2023-02-28 DIAGNOSIS — I680 Cerebral amyloid angiopathy: Secondary | ICD-10-CM | POA: Diagnosis not present

## 2023-02-28 DIAGNOSIS — I69351 Hemiplegia and hemiparesis following cerebral infarction affecting right dominant side: Secondary | ICD-10-CM | POA: Diagnosis not present

## 2023-02-28 DIAGNOSIS — I69354 Hemiplegia and hemiparesis following cerebral infarction affecting left non-dominant side: Secondary | ICD-10-CM | POA: Diagnosis not present

## 2023-02-28 DIAGNOSIS — I119 Hypertensive heart disease without heart failure: Secondary | ICD-10-CM | POA: Diagnosis not present

## 2023-02-28 DIAGNOSIS — K219 Gastro-esophageal reflux disease without esophagitis: Secondary | ICD-10-CM | POA: Diagnosis not present

## 2023-02-28 DIAGNOSIS — I69322 Dysarthria following cerebral infarction: Secondary | ICD-10-CM | POA: Diagnosis not present

## 2023-02-28 DIAGNOSIS — N139 Obstructive and reflux uropathy, unspecified: Secondary | ICD-10-CM | POA: Diagnosis not present

## 2023-02-28 DIAGNOSIS — G4730 Sleep apnea, unspecified: Secondary | ICD-10-CM | POA: Diagnosis not present

## 2023-02-28 DIAGNOSIS — D630 Anemia in neoplastic disease: Secondary | ICD-10-CM | POA: Diagnosis not present

## 2023-02-28 DIAGNOSIS — N179 Acute kidney failure, unspecified: Secondary | ICD-10-CM | POA: Diagnosis not present

## 2023-02-28 DIAGNOSIS — E785 Hyperlipidemia, unspecified: Secondary | ICD-10-CM | POA: Diagnosis not present

## 2023-02-28 DIAGNOSIS — E854 Organ-limited amyloidosis: Secondary | ICD-10-CM | POA: Diagnosis not present

## 2023-03-03 NOTE — Patient Instructions (Signed)
DUE TO COVID-19 ONLY TWO VISITORS  (aged 61 and older)  ARE ALLOWED TO COME WITH YOU AND STAY IN THE WAITING ROOM ONLY DURING PRE OP AND PROCEDURE.   **NO VISITORS ARE ALLOWED IN THE SHORT STAY AREA OR RECOVERY ROOM!!**  IF YOU WILL BE ADMITTED INTO THE HOSPITAL YOU ARE ALLOWED ONLY FOUR SUPPORT PEOPLE DURING VISITATION HOURS ONLY (7 AM -8PM)   The support person(s) must pass our screening, gel in and out, and wear a mask at all times, including in the patient's room. Patients must also wear a mask when staff or their support person are in the room. Visitors GUEST BADGE MUST BE WORN VISIBLY  One adult visitor may remain with you overnight and MUST be in the room by 8 P.M.     Your procedure is scheduled on: 03/10/23   Report to [facility] Main Entrance    Report to admitting at : 10:15 AM   Call this number if you have problems the morning of surgery [phone]   Do not eat food :After Midnight.   After Midnight you may have the following liquids until : 9:30 AM DAY OF SURGERY  Water Black Coffee (sugar ok, NO MILK/CREAM OR CREAMERS)  Tea (sugar ok, NO MILK/CREAM OR CREAMERS) regular and decaf                             Plain Jell-O (NO RED)                                           Fruit ices (not with fruit pulp, NO RED)                                     Popsicles (NO RED)                                                                  Juice: apple, WHITE grape, WHITE cranberry Sports drinks like Gatorade (NO RED)    Oral Hygiene is also important to reduce your risk of infection.                                    Remember - BRUSH YOUR TEETH THE MORNING OF SURGERY WITH YOUR REGULAR TOOTHPASTE  DENTURES WILL BE REMOVED PRIOR TO SURGERY PLEASE DO NOT APPLY "Poly grip" OR ADHESIVES!!!   Do NOT smoke after Midnight   Take these medicines the morning of surgery with A SIP OF WATER: tramadol as needed.  DO NOT TAKE ANY ORAL DIABETIC MEDICATIONS DAY OF YOUR  SURGERY  Bring CPAP mask and tubing day of surgery.                              You may not have any metal on your body including hair pins, jewelry, and body piercing  Do not wear make-up, lotions, powders, perfumes/cologne, or deodorant  Do not wear nail polish including gel and S&S, artificial/acrylic nails, or any other type of covering on natural nails including finger and toenails. If you have artificial nails, gel coating, etc. that needs to be removed by a nail salon please have this removed prior to surgery or surgery may need to be canceled/ delayed if the surgeon/ anesthesia feels like they are unable to be safely monitored.   Do not shave  48 hours prior to surgery.    Do not bring valuables to the hospital. [facility] IS NOT             RESPONSIBLE   FOR VALUABLES.   Contacts, glasses, or bridgework may not be worn into surgery.   Bring small overnight bag day of surgery.   DO NOT BRING YOUR HOME MEDICATIONS TO THE HOSPITAL. PHARMACY WILL DISPENSE MEDICATIONS LISTED ON YOUR MEDICATION LIST TO YOU DURING YOUR ADMISSION IN THE HOSPITAL!    Patients discharged on the day of surgery will not be allowed to drive home.  Someone NEEDS to stay with you for the first 24 hours after anesthesia.   Special Instructions: Bring a copy of your healthcare power of attorney and living will documents         the day of surgery if you haven't scanned them before.              Please read over the following fact sheets you were given: IF YOU HAVE QUESTIONS ABOUT YOUR PRE-OP INSTRUCTIONS PLEASE CALL [phone]    [facility] - Preparing for Surgery Before surgery, you can play an important role.  Because skin is not sterile, your skin needs to be as free of germs as possible.  You can reduce the number of germs on your skin by washing with CHG (chlorahexidine gluconate) soap before surgery.  CHG is an antiseptic cleaner which kills germs and bonds with the skin to continue killing  germs even after washing. Please DO NOT use if you have an allergy to CHG or antibacterial soaps.  If your skin becomes reddened/irritated stop using the CHG and inform your nurse when you arrive at Short Stay. Do not shave (including legs and underarms) for at least 48 hours prior to the first CHG shower.  You may shave your face/neck. Please follow these instructions carefully:  1.  Shower with CHG Soap the night before surgery and the  morning of Surgery.  2.  If you choose to wash your hair, wash your hair first as usual with your  normal  shampoo.  3.  After you shampoo, rinse your hair and body thoroughly to remove the  shampoo.                           4.  Use CHG as you would any other liquid soap.  You can apply chg directly  to the skin and wash                       Gently with a scrungie or clean washcloth.  5.  Apply the CHG Soap to your body ONLY FROM THE NECK DOWN.   Do not use on face/ open                           Wound or open sores. Avoid contact with eyes,  ears mouth and genitals (private parts).                       Wash face,  Genitals (private parts) with your normal soap.             6.  Wash thoroughly, paying special attention to the area where your surgery  will be performed.  7.  Thoroughly rinse your body with warm water from the neck down.  8.  DO NOT shower/wash with your normal soap after using and rinsing off  the CHG Soap.                9.  Pat yourself dry with a clean towel.            10.  Wear clean pajamas.            11.  Place clean sheets on your bed the night of your first shower and do not  sleep with pets. Day of Surgery : Do not apply any lotions/deodorants the morning of surgery.  Please wear clean clothes to the hospital/surgery center.  FAILURE TO FOLLOW THESE INSTRUCTIONS MAY RESULT IN THE CANCELLATION OF YOUR SURGERY PATIENT SIGNATURE_________________________________  NURSE  SIGNATURE__________________________________  ________________________________________________________________________

## 2023-03-04 ENCOUNTER — Other Ambulatory Visit: Payer: Self-pay

## 2023-03-04 ENCOUNTER — Encounter (HOSPITAL_COMMUNITY): Payer: Self-pay

## 2023-03-04 ENCOUNTER — Encounter (HOSPITAL_COMMUNITY)
Admission: RE | Admit: 2023-03-04 | Discharge: 2023-03-04 | Disposition: A | Discharge status: Home / Self Care | Payer: Medicare Other | Source: Ambulatory Visit | Attending: Urology | Admitting: Urology

## 2023-03-04 VITALS — BP 120/71 | HR 72 | Temp 97.9°F | Ht 66.0 in | Wt 153.0 lb

## 2023-03-04 DIAGNOSIS — Z8553 Personal history of malignant neoplasm of renal pelvis: Secondary | ICD-10-CM | POA: Insufficient documentation

## 2023-03-04 DIAGNOSIS — Z01818 Encounter for other preprocedural examination: Secondary | ICD-10-CM | POA: Insufficient documentation

## 2023-03-04 DIAGNOSIS — Z8673 Personal history of transient ischemic attack (TIA), and cerebral infarction without residual deficits: Secondary | ICD-10-CM | POA: Insufficient documentation

## 2023-03-04 DIAGNOSIS — G4730 Sleep apnea, unspecified: Secondary | ICD-10-CM | POA: Insufficient documentation

## 2023-03-04 DIAGNOSIS — N2889 Other specified disorders of kidney and ureter: Secondary | ICD-10-CM | POA: Diagnosis not present

## 2023-03-04 DIAGNOSIS — E785 Hyperlipidemia, unspecified: Secondary | ICD-10-CM | POA: Insufficient documentation

## 2023-03-04 DIAGNOSIS — I700 Atherosclerosis of aorta: Secondary | ICD-10-CM | POA: Insufficient documentation

## 2023-03-04 DIAGNOSIS — I10 Essential (primary) hypertension: Secondary | ICD-10-CM | POA: Insufficient documentation

## 2023-03-04 DIAGNOSIS — Z905 Acquired absence of kidney: Secondary | ICD-10-CM | POA: Diagnosis not present

## 2023-03-04 DIAGNOSIS — I6930 Unspecified sequelae of cerebral infarction: Secondary | ICD-10-CM

## 2023-03-04 NOTE — Progress Notes (Signed)
For Short Stay: COVID SWAB appointment date:  Bowel Prep reminder: N/A   For Anesthesia: PCP - DO: Everrett Coombe Cardiologist - N/A  Chest x-ray -  EKG - 03/04/23 Stress Test -  ECHO -  Cardiac Cath -  Pacemaker/ICD device last checked: Pacemaker orders received: Device Rep notified:  Spinal Cord Stimulator:  Sleep Study - Yes CPAP - No  Fasting Blood Sugar - N/A Checks Blood Sugar _____ times a day Date and result of last Hgb A1c-  Last dose of GLP1 agonist- N/A GLP1 instructions:   Last dose of SGLT-2 inhibitors-  SGLT-2 instructions:   Blood Thinner Instructions:  Aspirin Instructions: Will be held after: 03/04/23 Last Dose:  Activity level: Can go up a flight of stairs and activities of daily living without stopping and without chest pain and/or shortness of breath   Able to exercise without chest pain and/or shortness of breath  Anesthesia review: Hx: OSA(NO CPAP),CVA,Seizure(2016).  Patient denies shortness of breath, fever, cough and chest pain at PAT appointment   Patient verbalized understanding of instructions that were given to them at the PAT appointment. Patient was also instructed that they will need to review over the PAT instructions again at home before surgery.

## 2023-03-05 DIAGNOSIS — I69351 Hemiplegia and hemiparesis following cerebral infarction affecting right dominant side: Secondary | ICD-10-CM | POA: Diagnosis not present

## 2023-03-05 DIAGNOSIS — I700 Atherosclerosis of aorta: Secondary | ICD-10-CM | POA: Diagnosis not present

## 2023-03-05 DIAGNOSIS — I119 Hypertensive heart disease without heart failure: Secondary | ICD-10-CM | POA: Diagnosis not present

## 2023-03-05 DIAGNOSIS — D630 Anemia in neoplastic disease: Secondary | ICD-10-CM | POA: Diagnosis not present

## 2023-03-05 DIAGNOSIS — G8929 Other chronic pain: Secondary | ICD-10-CM | POA: Diagnosis not present

## 2023-03-05 DIAGNOSIS — G4730 Sleep apnea, unspecified: Secondary | ICD-10-CM | POA: Diagnosis not present

## 2023-03-05 DIAGNOSIS — E854 Organ-limited amyloidosis: Secondary | ICD-10-CM | POA: Diagnosis not present

## 2023-03-05 DIAGNOSIS — I680 Cerebral amyloid angiopathy: Secondary | ICD-10-CM | POA: Diagnosis not present

## 2023-03-05 DIAGNOSIS — N179 Acute kidney failure, unspecified: Secondary | ICD-10-CM | POA: Diagnosis not present

## 2023-03-05 DIAGNOSIS — M549 Dorsalgia, unspecified: Secondary | ICD-10-CM | POA: Diagnosis not present

## 2023-03-05 DIAGNOSIS — K219 Gastro-esophageal reflux disease without esophagitis: Secondary | ICD-10-CM | POA: Diagnosis not present

## 2023-03-05 DIAGNOSIS — C679 Malignant neoplasm of bladder, unspecified: Secondary | ICD-10-CM | POA: Diagnosis not present

## 2023-03-05 DIAGNOSIS — I69322 Dysarthria following cerebral infarction: Secondary | ICD-10-CM | POA: Diagnosis not present

## 2023-03-05 DIAGNOSIS — E785 Hyperlipidemia, unspecified: Secondary | ICD-10-CM | POA: Diagnosis not present

## 2023-03-05 DIAGNOSIS — N139 Obstructive and reflux uropathy, unspecified: Secondary | ICD-10-CM | POA: Diagnosis not present

## 2023-03-05 DIAGNOSIS — I69354 Hemiplegia and hemiparesis following cerebral infarction affecting left non-dominant side: Secondary | ICD-10-CM | POA: Diagnosis not present

## 2023-03-06 NOTE — Anesthesia Preprocedure Evaluation (Addendum)
Anesthesia Evaluation  Patient identified by MRN, date of birth, ID band Patient awake    Reviewed: Allergy & Precautions, NPO status , Patient's Chart, lab work & pertinent test results  History of Anesthesia Complications Negative for: history of anesthetic complications  Airway Mallampati: II  TM Distance: >3 FB Neck ROM: Full    Dental  (+) Poor Dentition, Dental Advisory Given   Pulmonary sleep apnea (does not use CPAP) , former smoker H/o lung met   breath sounds clear to auscultation       Cardiovascular hypertension (not on BP meds), (-) angina  Rhythm:Regular Rate:Normal  Echo 04/04/2015:   Interpretation Summary  The aortic valve is trileaflet.  The left ventricle is grossly normal size.  The left ventricular ejection fraction is normal (60-65%).  Grade I mild diastolic dysfunction; abnormal relaxation pattern.  The right ventricular ejection fraction is normal.  The left ventricular wall motion is normal.  Injection of contrast documented no interatrial shunt .       Neuro/Psych Seizures -, Well Controlled,  H/o brain met CVA (L hemiparesis), Residual Symptoms    GI/Hepatic negative GI ROS, Neg liver ROS,,,  Endo/Other  negative endocrine ROS    Renal/GU H/o renal cancer     Musculoskeletal   Abdominal   Peds  Hematology negative hematology ROS (+)   Anesthesia Other Findings   Reproductive/Obstetrics                             Anesthesia Physical Anesthesia Plan  ASA: 3  Anesthesia Plan: General   Post-op Pain Management: Tylenol PO (pre-op)*   Induction: Intravenous  PONV Risk Score and Plan: 3 and Ondansetron, Dexamethasone and Treatment may vary due to age or medical condition  Airway Management Planned: LMA  Additional Equipment: None  Intra-op Plan:   Post-operative Plan:   Informed Consent: I have reviewed the patients History and Physical, chart,  labs and discussed the procedure including the risks, benefits and alternatives for the proposed anesthesia with the patient or authorized representative who has indicated his/her understanding and acceptance.     Dental advisory given  Plan Discussed with: CRNA and Surgeon  Anesthesia Plan Comments: (See PAT note from 5/14 by Sherlie Ban PA-c)        Anesthesia Quick Evaluation

## 2023-03-06 NOTE — Progress Notes (Addendum)
Case: [id number] Date/Time: 03/10/23 1215   Procedure: CYSTOSCOPY/LEFT URETEROSCOPY WITH BIOPSY/LEFT URETERAL STENT PLACEMENT (Left) - 60 MINUTES NEEDED FOR CASE   Anesthesia type: General   Pre-op diagnosis: LEFT URETERAL MASS   [facility]   Surgeons: Heloise Purpura, MD       DISCUSSION: Jessica Ritter is a 62 yo female who presents to PAT prior to surgery listed above.  Past medical history significant for right-sided renal cell carcinoma status post right nephrectomy in 1998.  Patient has had multiple UTIs with hematuria over the past year and was recently admitted to the hospital for acute renal failure due to obstructive uropathy due to a L sided ureteral/bladder mass.  She underwent cystoscopy and left ureteral stent placement on 02/12/2023. Now scheduled for repeat cysto with biopsy of mass.  Other past medical history includes history of multiple CVAs, SDH, seizures, and brain tumor s/p resection. She previously followed with Neurology but lost to follow up in 2020. Has not had any recurrent neurologic events. Also followed with Oncology but due to being disease free for >15 years all therapy now deferred to primary care.  Patient follows with PCP Dr. Ashley Royalty - last seen on 02/21/23 for hospital follow up. No acute issues noted. Her renal function has improved s/p stenting.  No prior anesthesia complications   VS: BP 120/71   Pulse 72   Temp 36.6 C (Oral)   Ht 5\' 6"  (1.676 m)   Wt 69.4 kg   SpO2 98%   BMI 24.69 kg/m   PROVIDERS: Everrett Coombe, DO   LABS: Labs reviewed: Acceptable for surgery. (all labs ordered are listed, but only abnormal results are displayed)  Labs Reviewed - No data to display   IMAGES:  CT abdomen/pelvis w/wo contrast 02/10/23:  IMPRESSION: 1. Mild to moderate left-sided hydroureteronephrosis down to the pelvis where a focus of ureteral narrowing and circumferential wall thickening/enhancement is identified just  proximal to and extending into the UVJ. At least 2 enhancing nodules are seen along the posterior bladder wall mucosa near the left UVJ, likely components of a larger mucosal/bladder wall lesion. Imaging features are highly suspicious for urothelial neoplasm. 2. Mild left para-aortic and common iliac lymphadenopathy, suspicious for metastatic disease. 3. Mix lucent and sclerotic lesion identified in the intertrochanteric region of the proximal left femur, indeterminate. This does not appear overtly suspicious for metastatic lesion. Comparison to prior imaging studies recommended and be the most helpful means to further evaluate and establish chronicity. If this is a new finding, metastatic disease would be a greater concern. 4. Status post right nephrectomy. 5. Multiple hypoenhancing lesions scattered through both hepatic lobes. Dominant lesions in the left liver measure up to 15 mm and approach water attenuation, likely cysts. Multiple lesions are much too small to characterize but are statistically benign. Given the above described findings, attention on follow-up imaging recommended. 6.  Aortic Atherosclerosis (ICD10-I70.0).   EKG 03/04/23:  NSR   CV:  Echo 04/04/2015:  Interpretation Summary  A complete two-dimensional transthoracic echocardiogram was performed.  Saline contrast injection was performed. The study was technically  difficult. The aortic valve is trileaflet.  The left ventricle is grossly normal size.  The left ventricular ejection fraction is normal (60-65%).  Grade I mild diastolic dysfunction; abnormal relaxation pattern.  The right ventricular ejection fraction is normal.  The left ventricular wall motion is normal.  Injection of contrast documented no interatrial shunt .   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/2017   Gait disturbance, post-stroke 04/03/2018   History of lower GI bleeding 05/2018   History of malignant neoplasm metastatic to lung 2002   History of malignant neoplasm of brain 2003   secondary, renal   History of malignant neoplasm of renal pelvis 1998   History of multiple pulmonary nodules 2002   Hyperlipidemia    Hypertension    Seizures (HCC) 2016   tapered off of Keppra 06/2018   Sleep apnea    Subdural hematoma caused by concussion (HCC) 04/2014    Past Surgical History:  Procedure Laterality Date   BRAIN SURGERY     brain tumor surgery     COLONOSCOPY W/ POLYPECTOMY  05/2018   CYSTOSCOPY WITH RETROGRADE PYELOGRAM, URETEROSCOPY AND STENT PLACEMENT Left 02/12/2023   Procedure: CYSTOSCOPY WITH RETROGRADE PYELOGRAM, URETEROSCOPY AND STENT PLACEMENT;  Surgeon: Heloise Purpura, MD;  Location: [facility];  Service: Urology;  Laterality: Left;   KIDNEY SURGERY     LEG SURGERY     LUNG LOBECTOMY Right 2002   RLL    MEDICATIONS:  aspirin EC 81 MG tablet   atorvastatin (LIPITOR) 80 MG tablet   Calcium Carbonate-Vitamin D 600-400 MG-UNIT tablet   meloxicam (MOBIC) 7.5 MG tablet   traMADol-acetaminophen (ULTRACET) 37.5-325 MG tablet   valACYclovir (VALTREX) 500 MG tablet   No current facility-administered medications for this encounter.   Marcille Blanco [facility] Short Stay/Anesthesiology [facility] Phone [phone] 03/06/2023 11:59 AM

## 2023-03-07 DIAGNOSIS — I700 Atherosclerosis of aorta: Secondary | ICD-10-CM | POA: Diagnosis not present

## 2023-03-07 DIAGNOSIS — E785 Hyperlipidemia, unspecified: Secondary | ICD-10-CM | POA: Diagnosis not present

## 2023-03-07 DIAGNOSIS — I69354 Hemiplegia and hemiparesis following cerebral infarction affecting left non-dominant side: Secondary | ICD-10-CM | POA: Diagnosis not present

## 2023-03-07 DIAGNOSIS — D630 Anemia in neoplastic disease: Secondary | ICD-10-CM | POA: Diagnosis not present

## 2023-03-07 DIAGNOSIS — C679 Malignant neoplasm of bladder, unspecified: Secondary | ICD-10-CM | POA: Diagnosis not present

## 2023-03-07 DIAGNOSIS — I119 Hypertensive heart disease without heart failure: Secondary | ICD-10-CM | POA: Diagnosis not present

## 2023-03-07 DIAGNOSIS — E854 Organ-limited amyloidosis: Secondary | ICD-10-CM | POA: Diagnosis not present

## 2023-03-07 DIAGNOSIS — N179 Acute kidney failure, unspecified: Secondary | ICD-10-CM | POA: Diagnosis not present

## 2023-03-07 DIAGNOSIS — I69322 Dysarthria following cerebral infarction: Secondary | ICD-10-CM | POA: Diagnosis not present

## 2023-03-07 DIAGNOSIS — I69351 Hemiplegia and hemiparesis following cerebral infarction affecting right dominant side: Secondary | ICD-10-CM | POA: Diagnosis not present

## 2023-03-07 DIAGNOSIS — K219 Gastro-esophageal reflux disease without esophagitis: Secondary | ICD-10-CM | POA: Diagnosis not present

## 2023-03-07 DIAGNOSIS — I680 Cerebral amyloid angiopathy: Secondary | ICD-10-CM | POA: Diagnosis not present

## 2023-03-07 DIAGNOSIS — N139 Obstructive and reflux uropathy, unspecified: Secondary | ICD-10-CM | POA: Diagnosis not present

## 2023-03-07 DIAGNOSIS — M549 Dorsalgia, unspecified: Secondary | ICD-10-CM | POA: Diagnosis not present

## 2023-03-07 DIAGNOSIS — G4730 Sleep apnea, unspecified: Secondary | ICD-10-CM | POA: Diagnosis not present

## 2023-03-07 DIAGNOSIS — G8929 Other chronic pain: Secondary | ICD-10-CM | POA: Diagnosis not present

## 2023-03-10 ENCOUNTER — Ambulatory Visit (HOSPITAL_BASED_OUTPATIENT_CLINIC_OR_DEPARTMENT_OTHER): Payer: Medicare Other | Admitting: Anesthesiology

## 2023-03-10 ENCOUNTER — Other Ambulatory Visit: Payer: Self-pay | Admitting: Family Medicine

## 2023-03-10 ENCOUNTER — Encounter (HOSPITAL_COMMUNITY)
Admission: RE | Disposition: A | Discharge status: Home / Self Care | Payer: Self-pay | Source: Home / Self Care | Attending: Urology

## 2023-03-10 ENCOUNTER — Other Ambulatory Visit: Payer: Self-pay

## 2023-03-10 ENCOUNTER — Ambulatory Visit (HOSPITAL_COMMUNITY)
Admission: RE | Admit: 2023-03-10 | Discharge: 2023-03-10 | Disposition: A | Discharge status: Home / Self Care | Payer: Medicare Other | Attending: Urology | Admitting: Urology

## 2023-03-10 ENCOUNTER — Encounter (HOSPITAL_COMMUNITY): Payer: Self-pay | Admitting: Urology

## 2023-03-10 ENCOUNTER — Ambulatory Visit (HOSPITAL_COMMUNITY): Payer: Medicare Other

## 2023-03-10 ENCOUNTER — Ambulatory Visit (HOSPITAL_COMMUNITY): Payer: Medicare Other | Admitting: Medical

## 2023-03-10 DIAGNOSIS — G8194 Hemiplegia, unspecified affecting left nondominant side: Secondary | ICD-10-CM | POA: Diagnosis not present

## 2023-03-10 DIAGNOSIS — N132 Hydronephrosis with renal and ureteral calculous obstruction: Secondary | ICD-10-CM | POA: Diagnosis not present

## 2023-03-10 DIAGNOSIS — Z85841 Personal history of malignant neoplasm of brain: Secondary | ICD-10-CM | POA: Diagnosis not present

## 2023-03-10 DIAGNOSIS — I10 Essential (primary) hypertension: Secondary | ICD-10-CM | POA: Insufficient documentation

## 2023-03-10 DIAGNOSIS — C670 Malignant neoplasm of trigone of bladder: Secondary | ICD-10-CM | POA: Diagnosis not present

## 2023-03-10 DIAGNOSIS — Z85528 Personal history of other malignant neoplasm of kidney: Secondary | ICD-10-CM | POA: Diagnosis not present

## 2023-03-10 DIAGNOSIS — N135 Crossing vessel and stricture of ureter without hydronephrosis: Secondary | ICD-10-CM

## 2023-03-10 DIAGNOSIS — G4730 Sleep apnea, unspecified: Secondary | ICD-10-CM | POA: Diagnosis not present

## 2023-03-10 DIAGNOSIS — Z902 Acquired absence of lung [part of]: Secondary | ICD-10-CM | POA: Diagnosis not present

## 2023-03-10 DIAGNOSIS — Z85118 Personal history of other malignant neoplasm of bronchus and lung: Secondary | ICD-10-CM | POA: Diagnosis not present

## 2023-03-10 DIAGNOSIS — Z905 Acquired absence of kidney: Secondary | ICD-10-CM | POA: Insufficient documentation

## 2023-03-10 DIAGNOSIS — R8289 Other abnormal findings on cytological and histological examination of urine: Secondary | ICD-10-CM | POA: Diagnosis not present

## 2023-03-10 DIAGNOSIS — E782 Mixed hyperlipidemia: Secondary | ICD-10-CM

## 2023-03-10 DIAGNOSIS — Q600 Renal agenesis, unilateral: Secondary | ICD-10-CM | POA: Diagnosis not present

## 2023-03-10 DIAGNOSIS — Z87891 Personal history of nicotine dependence: Secondary | ICD-10-CM | POA: Insufficient documentation

## 2023-03-10 DIAGNOSIS — N179 Acute kidney failure, unspecified: Secondary | ICD-10-CM | POA: Insufficient documentation

## 2023-03-10 HISTORY — PX: CYSTOSCOPY/URETEROSCOPY/HOLMIUM LASER/STENT PLACEMENT: SHX6546

## 2023-03-10 LAB — BASIC METABOLIC PANEL
Anion gap: 8 (ref 5–15)
BUN: 30 mg/dL — ABNORMAL HIGH (ref 6–20)
CO2: 25 mmol/L (ref 22–32)
Calcium: 9.6 mg/dL (ref 8.9–10.3)
Chloride: 109 mmol/L (ref 98–111)
Creatinine, Ser: 1.02 mg/dL — ABNORMAL HIGH (ref 0.44–1.00)
GFR, Estimated: >60 mL/min (ref >60)
Glucose, Bld: 101 mg/dL — ABNORMAL HIGH (ref 70–99)
Potassium: 4 mmol/L (ref 3.5–5.1)
Sodium: 142 mmol/L (ref 135–145)

## 2023-03-10 SURGERY — CYSTOSCOPY/URETEROSCOPY/HOLMIUM LASER/STENT PLACEMENT
Anesthesia: General | Laterality: Left

## 2023-03-10 MED ORDER — MIDAZOLAM HCL 2 MG/2ML IJ SOLN
INTRAMUSCULAR | Status: AC
  Filled 2023-03-10: qty 2

## 2023-03-10 MED ORDER — PHENAZOPYRIDINE HCL 200 MG PO TABS: 200.0000 mg | ORAL_TABLET | Freq: Three times a day (TID) | ORAL | 0 refills | Status: DC | PRN

## 2023-03-10 MED ORDER — LIDOCAINE HCL (CARDIAC) PF 100 MG/5ML IV SOSY
PREFILLED_SYRINGE | INTRAVENOUS | Status: DC | PRN
  Administered 2023-03-10: 20 mg via INTRAVENOUS

## 2023-03-10 MED ORDER — FENTANYL CITRATE (PF) 100 MCG/2ML IJ SOLN
INTRAMUSCULAR | Status: AC
  Filled 2023-03-10: qty 2

## 2023-03-10 MED ORDER — FENTANYL CITRATE PF 50 MCG/ML IJ SOSY: 25.0000 ug | PREFILLED_SYRINGE | INTRAMUSCULAR | Status: DC | PRN

## 2023-03-10 MED ORDER — 0.9 % SODIUM CHLORIDE (POUR BTL) OPTIME
TOPICAL | Status: DC | PRN
  Administered 2023-03-10: 1000 mL

## 2023-03-10 MED ORDER — PROMETHAZINE HCL 25 MG/ML IJ SOLN: 6.2500 mg | INTRAMUSCULAR | Status: DC | PRN

## 2023-03-10 MED ORDER — PROPOFOL 10 MG/ML IV BOLUS
INTRAVENOUS | Status: AC
  Filled 2023-03-10: qty 20

## 2023-03-10 MED ORDER — ORAL CARE MOUTH RINSE: 15.0000 mL | Freq: Once | OROMUCOSAL | Status: AC

## 2023-03-10 MED ORDER — PROPOFOL 10 MG/ML IV BOLUS
INTRAVENOUS | Status: DC | PRN
  Administered 2023-03-10: 150 mg via INTRAVENOUS

## 2023-03-10 MED ORDER — DEXAMETHASONE SODIUM PHOSPHATE 10 MG/ML IJ SOLN
INTRAMUSCULAR | Status: DC | PRN
  Administered 2023-03-10: 4 mg via INTRAVENOUS

## 2023-03-10 MED ORDER — CEFAZOLIN SODIUM-DEXTROSE 2-4 GM/100ML-% IV SOLN
2.0000 g | INTRAVENOUS | Status: AC
  Administered 2023-03-10: 2 g via INTRAVENOUS
  Filled 2023-03-10: qty 100

## 2023-03-10 MED ORDER — EPHEDRINE 5 MG/ML INJ
INTRAVENOUS | Status: AC
  Filled 2023-03-10: qty 5

## 2023-03-10 MED ORDER — LACTATED RINGERS IV SOLN: INTRAVENOUS | Status: DC

## 2023-03-10 MED ORDER — MIDAZOLAM HCL 2 MG/2ML IJ SOLN: 0.5000 mg | Freq: Once | INTRAMUSCULAR | Status: DC | PRN

## 2023-03-10 MED ORDER — ONDANSETRON HCL 4 MG/2ML IJ SOLN
INTRAMUSCULAR | Status: DC | PRN
  Administered 2023-03-10: 4 mg via INTRAVENOUS

## 2023-03-10 MED ORDER — ONDANSETRON HCL 4 MG/2ML IJ SOLN
INTRAMUSCULAR | Status: AC
  Filled 2023-03-10: qty 2

## 2023-03-10 MED ORDER — ACETAMINOPHEN 500 MG PO TABS
1000.0000 mg | ORAL_TABLET | Freq: Once | ORAL | Status: AC
  Administered 2023-03-10: 1000 mg via ORAL
  Filled 2023-03-10: qty 2

## 2023-03-10 MED ORDER — OXYCODONE HCL 5 MG PO TABS: 5.0000 mg | ORAL_TABLET | Freq: Once | ORAL | Status: DC | PRN

## 2023-03-10 MED ORDER — STERILE WATER FOR IRRIGATION IR SOLN
Status: DC | PRN
  Administered 2023-03-10: 3000 mL

## 2023-03-10 MED ORDER — EPHEDRINE SULFATE (PRESSORS) 50 MG/ML IJ SOLN
INTRAMUSCULAR | Status: DC | PRN
  Administered 2023-03-10: 5 mg via INTRAVENOUS

## 2023-03-10 MED ORDER — SODIUM CHLORIDE 0.9 % IR SOLN
Status: DC | PRN
  Administered 2023-03-10: 3000 mL via INTRAVESICAL

## 2023-03-10 MED ORDER — CHLORHEXIDINE GLUCONATE 0.12 % MT SOLN
15.0000 mL | Freq: Once | OROMUCOSAL | Status: AC
  Administered 2023-03-10: 15 mL via OROMUCOSAL

## 2023-03-10 MED ORDER — FENTANYL CITRATE (PF) 100 MCG/2ML IJ SOLN
INTRAMUSCULAR | Status: DC | PRN
  Administered 2023-03-10 (×2): 25 ug via INTRAVENOUS
  Administered 2023-03-10: 50 ug via INTRAVENOUS

## 2023-03-10 MED ORDER — OXYCODONE HCL 5 MG/5ML PO SOLN: 5.0000 mg | Freq: Once | ORAL | Status: DC | PRN

## 2023-03-10 SURGICAL SUPPLY — 26 items
BAG COUNTER SPONGE SURGICOUNT (BAG) IMPLANT
BAG SPNG CNTER NS LX DISP (BAG)
BAG URO CATCHER STRL LF (MISCELLANEOUS) ×1 IMPLANT
BASKET ZERO TIP NITINOL 2.4FR (BASKET) IMPLANT
BRUSH URET BIOPSY 3F (UROLOGICAL SUPPLIES) IMPLANT
BSKT STON RTRVL ZERO TP 2.4FR (BASKET)
CATH URETL OPEN END 6FR 70 (CATHETERS) IMPLANT
CLOTH BEACON ORANGE TIMEOUT ST (SAFETY) ×1 IMPLANT
GLOVE SURG LX STRL 7.5 STRW (GLOVE) ×1 IMPLANT
GOWN STRL REUS W/ TWL XL LVL3 (GOWN DISPOSABLE) ×1 IMPLANT
GOWN STRL REUS W/TWL XL LVL3 (GOWN DISPOSABLE) ×1
GUIDEWIRE STR DUAL SENSOR (WIRE) ×1 IMPLANT
GUIDEWIRE ZIPWRE .038 STRAIGHT (WIRE) IMPLANT
IV NS 1000ML (IV SOLUTION)
IV NS 1000ML BAXH (IV SOLUTION) ×1 IMPLANT
KIT TURNOVER KIT A (KITS) IMPLANT
LASER FIB FLEXIVA PULSE ID 365 (Laser) IMPLANT
MANIFOLD NEPTUNE II (INSTRUMENTS) ×1 IMPLANT
PACK CYSTO (CUSTOM PROCEDURE TRAY) ×1 IMPLANT
PAD TELFA 2X3 NADH STRL (GAUZE/BANDAGES/DRESSINGS) IMPLANT
SHEATH NAVIGATOR HD 12/14X36 (SHEATH) IMPLANT
STENT URET 6FRX26 CONTOUR (STENTS) IMPLANT
TRACTIP FLEXIVA PULS ID 200XHI (Laser) IMPLANT
TRACTIP FLEXIVA PULSE ID 200 (Laser)
TUBING CONNECTING 10 (TUBING) ×1 IMPLANT
TUBING UROLOGY SET (TUBING) ×1 IMPLANT

## 2023-03-10 NOTE — Anesthesia Postprocedure Evaluation (Signed)
Anesthesia Post Note  Patient: Marlou Sa  Procedure(s) Performed: CYSTOSCOPY/LEFT URETEROSCOPY WITH BRUSH BIOPSY/LEFT URETERAL STENT PLACEMENT WITH BLADDER BIOPSIES (Left)     Patient location during evaluation: PACU Anesthesia Type: General Level of consciousness: awake and alert, patient cooperative and oriented Pain management: pain level controlled Vital Signs Assessment: post-procedure vital signs reviewed and stable Respiratory status: spontaneous breathing, nonlabored ventilation and respiratory function stable Cardiovascular status: blood pressure returned to baseline and stable Postop Assessment: no apparent nausea or vomiting, able to ambulate and adequate PO intake Anesthetic complications: no   No notable events documented.  Last Vitals:  Vitals:   03/10/23 1415 03/10/23 1427  BP: 106/73 112/68  Pulse: 64   Resp: 15   Temp: 36.7 C 36.6 C  SpO2: 93%     Last Pain:  Vitals:   03/10/23 1400  TempSrc:   PainSc: 0-No pain                 Barkley Kratochvil,E. Clayton Bosserman

## 2023-03-10 NOTE — Discharge Instructions (Addendum)

## 2023-03-10 NOTE — Transfer of Care (Signed)
Immediate Anesthesia Transfer of Care Note  Patient: Jessica Ritter  Procedure(s) Performed: CYSTOSCOPY/LEFT URETEROSCOPY WITH BRUSH BIOPSY/LEFT URETERAL STENT PLACEMENT WITH BLADDER BIOPSIES (Left)  Patient Location: PACU  Anesthesia Type:General  Level of Consciousness: awake, alert , oriented, and patient cooperative  Airway & Oxygen Therapy: Patient Spontanous Breathing and Patient connected to face mask oxygen  Post-op Assessment: Report given to RN and Post -op Vital signs reviewed and stable  Post vital signs: Reviewed and stable  Last Vitals:  Vitals Value Taken Time  BP 106/65 03/10/23 1330  Temp    Pulse 66 03/10/23 1332  Resp 7 03/10/23 1332  SpO2 100 % 03/10/23 1332  Vitals shown include unvalidated device data.  Last Pain:  Vitals:   03/10/23 1032  TempSrc: Oral  PainSc:          Complications: No notable events documented.

## 2023-03-10 NOTE — Anesthesia Procedure Notes (Signed)
Procedure Name: LMA Insertion Date/Time: 03/10/2023 12:46 PM  Performed by: Garth Bigness, CRNAPre-anesthesia Checklist: Patient identified, Emergency Drugs available, Suction available and Patient being monitored Patient Re-evaluated:Patient Re-evaluated prior to induction Oxygen Delivery Method: Circle system utilized Preoxygenation: Pre-oxygenation with 100% oxygen Induction Type: IV induction Ventilation: Mask ventilation without difficulty LMA: LMA with gastric port inserted LMA Size: 4.0 Tube type: Oral Number of attempts: 1 Placement Confirmation: positive ETCO2 and breath sounds checked- equal and bilateral Tube secured with: Tape Dental Injury: Teeth and Oropharynx as per pre-operative assessment

## 2023-03-10 NOTE — Op Note (Signed)
Preoperative diagnosis: Left ureteral obstruction with possible left ureteral mass, solitary left kidney  Postoperative diagnosis: Left ureteral obstruction with possible left ureteral mass, solitary left kidney  Procedures: 1.  Cystoscopy 2.  Pelvic exam under anesthesia 3.  Left ureteroscopy with brush biopsy of distal left ureter 4.  Bladder biopsies of the left trigonal extrinsic mass 5.  Left ureteral stent (6 x 26-no string)  Surgeon: Moody Bruins MD  Anesthesia: General  Complications: None  EBL: Minimal  Specimens: 1.  Left ureteral brush biopsy 2.  Bladder biopsies of extrinsic left trigonal bladder mass  Disposition of specimens: Pathology  Indication: Ms. Egge is a 62 year old female who recently presented in acute renal failure with obstruction of her solitary left kidney.  She underwent left ureteral stent placement.  However, attempts at left ureteroscopy were unsuccessful due to the significant mass effect of the obstruction.  She presents today after allowing for passive ureteral dilation with her indwelling stent.  Her renal function has returned to normal with a serum creatinine today 1.02.  After reviewing options, it was determined to have her present for repeat attempted ureteroscopy with additional diagnostic evaluation including possible biopsies.  The potential risks, complications, and expected recovery process associated with the planned procedures were discussed with her and her husband.  She gives informed consent to proceed.  Description of procedure: The patient was taken the operating room and a general anesthetic was administered.  She was given preoperative antibiotics, placed in the dorsolithotomy position, and prepped and draped in usual sterile fashion.  Next, a preoperative timeout was performed.  Cystourethroscopy was then performed which revealed her indwelling left ureteral stent.  There was a prominent bulge noted just medial to the left  ureteral orifice but was extrinsic and appeared to have normal bladder mucosa overlying it.  No other bladder tumors or other abnormalities were identified.  The ureteral stent was then brought out to the urethral meatus with flexible graspers and a 0.38 sensor guidewire was advanced through the stent up into the left renal collecting system.  The 6 French semirigid ureteroscope was then advanced next to the wire and there did not appear to be an obvious intrinsic left ureteral mass.  A brush biopsy of the distal left ureter was performed.  The ureteral stent was then replaced with a 6 x 26 double-J ureteral stent advanced over the wire using Seldinger technique.  The wire was removed with a good curl noted in the renal pelvis as well as within the bladder.  Attention then turned to the extrinsic mass that appeared to be bulging posteriorly just medial to the left ureteral orifice.  The irrigation fluid was changed to sterile water and the cold cup biopsy forceps was used to perform multiple biopsies of this area.  A Bugbee electrode was used to achieve hemostasis.  The bladder was emptied and reinspected and there appeared to be no ongoing bleeding.  In addition, a pelvic exam under anesthesia was performed.  No intravaginal masses were noted.  No anterior pelvic mass could be appreciated either.  The patient tolerated the procedure well without complications.  She was able to be awakened and transferred to recovery unit in satisfactory condition.

## 2023-03-10 NOTE — Interval H&P Note (Signed)
History and Physical Interval Note:  03/10/2023 12:19 PM  Jessica Ritter  has presented today for surgery, with the diagnosis of LEFT URETERAL MASS.  The various methods of treatment have been discussed with the patient and family. After consideration of risks, benefits and other options for treatment, the patient has consented to  Procedure(s) with comments: CYSTOSCOPY/LEFT URETEROSCOPY WITH BIOPSY/LEFT URETERAL STENT PLACEMENT (Left) - 60 MINUTES NEEDED FOR CASE as a surgical intervention.  The patient's history has been reviewed, patient examined, no change in status, stable for surgery.  I have reviewed the patient's chart and labs.  Questions were answered to the patient's satisfaction.     [employer]

## 2023-03-11 ENCOUNTER — Encounter (HOSPITAL_COMMUNITY): Payer: Self-pay | Admitting: Urology

## 2023-03-11 DIAGNOSIS — I69322 Dysarthria following cerebral infarction: Secondary | ICD-10-CM | POA: Diagnosis not present

## 2023-03-11 DIAGNOSIS — G4730 Sleep apnea, unspecified: Secondary | ICD-10-CM | POA: Diagnosis not present

## 2023-03-11 DIAGNOSIS — I69351 Hemiplegia and hemiparesis following cerebral infarction affecting right dominant side: Secondary | ICD-10-CM | POA: Diagnosis not present

## 2023-03-11 DIAGNOSIS — N179 Acute kidney failure, unspecified: Secondary | ICD-10-CM | POA: Diagnosis not present

## 2023-03-11 DIAGNOSIS — G8929 Other chronic pain: Secondary | ICD-10-CM | POA: Diagnosis not present

## 2023-03-11 DIAGNOSIS — N139 Obstructive and reflux uropathy, unspecified: Secondary | ICD-10-CM | POA: Diagnosis not present

## 2023-03-11 DIAGNOSIS — E785 Hyperlipidemia, unspecified: Secondary | ICD-10-CM | POA: Diagnosis not present

## 2023-03-11 DIAGNOSIS — E854 Organ-limited amyloidosis: Secondary | ICD-10-CM | POA: Diagnosis not present

## 2023-03-11 DIAGNOSIS — D630 Anemia in neoplastic disease: Secondary | ICD-10-CM | POA: Diagnosis not present

## 2023-03-11 DIAGNOSIS — C679 Malignant neoplasm of bladder, unspecified: Secondary | ICD-10-CM | POA: Diagnosis not present

## 2023-03-11 DIAGNOSIS — I700 Atherosclerosis of aorta: Secondary | ICD-10-CM | POA: Diagnosis not present

## 2023-03-11 DIAGNOSIS — M549 Dorsalgia, unspecified: Secondary | ICD-10-CM | POA: Diagnosis not present

## 2023-03-11 DIAGNOSIS — I119 Hypertensive heart disease without heart failure: Secondary | ICD-10-CM | POA: Diagnosis not present

## 2023-03-11 DIAGNOSIS — K219 Gastro-esophageal reflux disease without esophagitis: Secondary | ICD-10-CM | POA: Diagnosis not present

## 2023-03-11 DIAGNOSIS — I69354 Hemiplegia and hemiparesis following cerebral infarction affecting left non-dominant side: Secondary | ICD-10-CM | POA: Diagnosis not present

## 2023-03-11 DIAGNOSIS — I680 Cerebral amyloid angiopathy: Secondary | ICD-10-CM | POA: Diagnosis not present

## 2023-03-11 LAB — CYTOLOGY - NON PAP

## 2023-03-11 LAB — SURGICAL PATHOLOGY

## 2023-03-19 DIAGNOSIS — I700 Atherosclerosis of aorta: Secondary | ICD-10-CM | POA: Diagnosis not present

## 2023-03-19 DIAGNOSIS — I680 Cerebral amyloid angiopathy: Secondary | ICD-10-CM | POA: Diagnosis not present

## 2023-03-19 DIAGNOSIS — G4730 Sleep apnea, unspecified: Secondary | ICD-10-CM | POA: Diagnosis not present

## 2023-03-19 DIAGNOSIS — D630 Anemia in neoplastic disease: Secondary | ICD-10-CM | POA: Diagnosis not present

## 2023-03-19 DIAGNOSIS — I69351 Hemiplegia and hemiparesis following cerebral infarction affecting right dominant side: Secondary | ICD-10-CM | POA: Diagnosis not present

## 2023-03-19 DIAGNOSIS — I69322 Dysarthria following cerebral infarction: Secondary | ICD-10-CM | POA: Diagnosis not present

## 2023-03-19 DIAGNOSIS — N179 Acute kidney failure, unspecified: Secondary | ICD-10-CM | POA: Diagnosis not present

## 2023-03-19 DIAGNOSIS — N139 Obstructive and reflux uropathy, unspecified: Secondary | ICD-10-CM | POA: Diagnosis not present

## 2023-03-19 DIAGNOSIS — M549 Dorsalgia, unspecified: Secondary | ICD-10-CM | POA: Diagnosis not present

## 2023-03-19 DIAGNOSIS — C679 Malignant neoplasm of bladder, unspecified: Secondary | ICD-10-CM | POA: Diagnosis not present

## 2023-03-19 DIAGNOSIS — E785 Hyperlipidemia, unspecified: Secondary | ICD-10-CM | POA: Diagnosis not present

## 2023-03-19 DIAGNOSIS — I119 Hypertensive heart disease without heart failure: Secondary | ICD-10-CM | POA: Diagnosis not present

## 2023-03-19 DIAGNOSIS — G8929 Other chronic pain: Secondary | ICD-10-CM | POA: Diagnosis not present

## 2023-03-19 DIAGNOSIS — I69354 Hemiplegia and hemiparesis following cerebral infarction affecting left non-dominant side: Secondary | ICD-10-CM | POA: Diagnosis not present

## 2023-03-19 DIAGNOSIS — E854 Organ-limited amyloidosis: Secondary | ICD-10-CM | POA: Diagnosis not present

## 2023-03-19 DIAGNOSIS — K219 Gastro-esophageal reflux disease without esophagitis: Secondary | ICD-10-CM | POA: Diagnosis not present

## 2023-03-25 DIAGNOSIS — N131 Hydronephrosis with ureteral stricture, not elsewhere classified: Secondary | ICD-10-CM | POA: Diagnosis not present

## 2023-03-25 DIAGNOSIS — C678 Malignant neoplasm of overlapping sites of bladder: Secondary | ICD-10-CM | POA: Diagnosis not present

## 2023-03-26 DIAGNOSIS — I69354 Hemiplegia and hemiparesis following cerebral infarction affecting left non-dominant side: Secondary | ICD-10-CM | POA: Diagnosis not present

## 2023-03-26 DIAGNOSIS — I680 Cerebral amyloid angiopathy: Secondary | ICD-10-CM | POA: Diagnosis not present

## 2023-03-26 DIAGNOSIS — N179 Acute kidney failure, unspecified: Secondary | ICD-10-CM | POA: Diagnosis not present

## 2023-03-26 DIAGNOSIS — E785 Hyperlipidemia, unspecified: Secondary | ICD-10-CM | POA: Diagnosis not present

## 2023-03-26 DIAGNOSIS — G4730 Sleep apnea, unspecified: Secondary | ICD-10-CM | POA: Diagnosis not present

## 2023-03-26 DIAGNOSIS — I69351 Hemiplegia and hemiparesis following cerebral infarction affecting right dominant side: Secondary | ICD-10-CM | POA: Diagnosis not present

## 2023-03-26 DIAGNOSIS — N139 Obstructive and reflux uropathy, unspecified: Secondary | ICD-10-CM | POA: Diagnosis not present

## 2023-03-26 DIAGNOSIS — E854 Organ-limited amyloidosis: Secondary | ICD-10-CM | POA: Diagnosis not present

## 2023-03-26 DIAGNOSIS — C679 Malignant neoplasm of bladder, unspecified: Secondary | ICD-10-CM | POA: Diagnosis not present

## 2023-03-26 DIAGNOSIS — D630 Anemia in neoplastic disease: Secondary | ICD-10-CM | POA: Diagnosis not present

## 2023-03-26 DIAGNOSIS — K219 Gastro-esophageal reflux disease without esophagitis: Secondary | ICD-10-CM | POA: Diagnosis not present

## 2023-03-26 DIAGNOSIS — M549 Dorsalgia, unspecified: Secondary | ICD-10-CM | POA: Diagnosis not present

## 2023-03-26 DIAGNOSIS — I700 Atherosclerosis of aorta: Secondary | ICD-10-CM | POA: Diagnosis not present

## 2023-03-26 DIAGNOSIS — I69322 Dysarthria following cerebral infarction: Secondary | ICD-10-CM | POA: Diagnosis not present

## 2023-03-26 DIAGNOSIS — I119 Hypertensive heart disease without heart failure: Secondary | ICD-10-CM | POA: Diagnosis not present

## 2023-03-26 DIAGNOSIS — G8929 Other chronic pain: Secondary | ICD-10-CM | POA: Diagnosis not present

## 2023-03-28 DIAGNOSIS — K219 Gastro-esophageal reflux disease without esophagitis: Secondary | ICD-10-CM | POA: Diagnosis not present

## 2023-03-28 DIAGNOSIS — C7910 Secondary malignant neoplasm of unspecified urinary organs: Secondary | ICD-10-CM | POA: Diagnosis not present

## 2023-03-28 DIAGNOSIS — R338 Other retention of urine: Secondary | ICD-10-CM | POA: Diagnosis not present

## 2023-03-28 DIAGNOSIS — Z905 Acquired absence of kidney: Secondary | ICD-10-CM | POA: Diagnosis not present

## 2023-03-28 DIAGNOSIS — N201 Calculus of ureter: Secondary | ICD-10-CM | POA: Diagnosis not present

## 2023-03-28 DIAGNOSIS — Z85118 Personal history of other malignant neoplasm of bronchus and lung: Secondary | ICD-10-CM | POA: Diagnosis not present

## 2023-03-28 DIAGNOSIS — I69322 Dysarthria following cerebral infarction: Secondary | ICD-10-CM | POA: Diagnosis not present

## 2023-03-28 DIAGNOSIS — R591 Generalized enlarged lymph nodes: Secondary | ICD-10-CM | POA: Diagnosis not present

## 2023-03-28 DIAGNOSIS — Z8679 Personal history of other diseases of the circulatory system: Secondary | ICD-10-CM | POA: Diagnosis not present

## 2023-03-28 DIAGNOSIS — I69354 Hemiplegia and hemiparesis following cerebral infarction affecting left non-dominant side: Secondary | ICD-10-CM | POA: Diagnosis not present

## 2023-03-28 DIAGNOSIS — Z888 Allergy status to other drugs, medicaments and biological substances status: Secondary | ICD-10-CM | POA: Diagnosis not present

## 2023-03-28 DIAGNOSIS — D649 Anemia, unspecified: Secondary | ICD-10-CM | POA: Diagnosis not present

## 2023-03-28 DIAGNOSIS — Z85528 Personal history of other malignant neoplasm of kidney: Secondary | ICD-10-CM | POA: Diagnosis not present

## 2023-03-28 DIAGNOSIS — C670 Malignant neoplasm of trigone of bladder: Secondary | ICD-10-CM | POA: Diagnosis not present

## 2023-03-28 DIAGNOSIS — Z8589 Personal history of malignant neoplasm of other organs and systems: Secondary | ICD-10-CM | POA: Diagnosis not present

## 2023-03-28 DIAGNOSIS — I6389 Other cerebral infarction: Secondary | ICD-10-CM | POA: Diagnosis not present

## 2023-03-28 DIAGNOSIS — C679 Malignant neoplasm of bladder, unspecified: Secondary | ICD-10-CM | POA: Diagnosis not present

## 2023-03-28 DIAGNOSIS — N1330 Unspecified hydronephrosis: Secondary | ICD-10-CM | POA: Diagnosis not present

## 2023-03-28 DIAGNOSIS — G9389 Other specified disorders of brain: Secondary | ICD-10-CM | POA: Diagnosis not present

## 2023-03-28 DIAGNOSIS — D62 Acute posthemorrhagic anemia: Secondary | ICD-10-CM | POA: Diagnosis not present

## 2023-03-28 DIAGNOSIS — N99820 Postprocedural hemorrhage and hematoma of a genitourinary system organ or structure following a genitourinary system procedure: Secondary | ICD-10-CM | POA: Diagnosis not present

## 2023-03-28 DIAGNOSIS — Z85841 Personal history of malignant neoplasm of brain: Secondary | ICD-10-CM | POA: Diagnosis not present

## 2023-03-28 DIAGNOSIS — R319 Hematuria, unspecified: Secondary | ICD-10-CM | POA: Diagnosis not present

## 2023-03-28 DIAGNOSIS — E785 Hyperlipidemia, unspecified: Secondary | ICD-10-CM | POA: Diagnosis not present

## 2023-03-28 DIAGNOSIS — I10 Essential (primary) hypertension: Secondary | ICD-10-CM | POA: Diagnosis not present

## 2023-03-28 DIAGNOSIS — E854 Organ-limited amyloidosis: Secondary | ICD-10-CM | POA: Diagnosis not present

## 2023-03-28 DIAGNOSIS — R000 Tachycardia, unspecified: Secondary | ICD-10-CM | POA: Diagnosis not present

## 2023-03-28 DIAGNOSIS — I680 Cerebral amyloid angiopathy: Secondary | ICD-10-CM | POA: Diagnosis not present

## 2023-03-28 DIAGNOSIS — R310 Gross hematuria: Secondary | ICD-10-CM | POA: Diagnosis not present

## 2023-04-01 ENCOUNTER — Telehealth: Payer: Self-pay

## 2023-04-01 DIAGNOSIS — C676 Malignant neoplasm of ureteric orifice: Secondary | ICD-10-CM | POA: Insufficient documentation

## 2023-04-01 NOTE — Transitions of Care (Post Inpatient/ED Visit) (Signed)
   04/01/2023  Name: Jessica Ritter MRN: [id number] DOB: 05/13/1961  Today's TOC FU Call Status: Today's TOC FU Call Status:: Unsuccessul Call (1st Attempt) Unsuccessful Call (1st Attempt) Date: 04/01/23  Attempted to reach the patient regarding the most recent Inpatient/ED visit.  Follow Up Plan: Additional outreach attempts will be made to reach the patient to complete the Transitions of Care (Post Inpatient/ED visit) call.     Antionette Fairy, RN,BSN,CCM [facility] Care Management Care Management Community Coordinator Direct Phone: [phone] Toll Free: [phone] Fax: [phone]

## 2023-04-02 ENCOUNTER — Telehealth: Payer: Self-pay

## 2023-04-02 DIAGNOSIS — C679 Malignant neoplasm of bladder, unspecified: Secondary | ICD-10-CM | POA: Diagnosis not present

## 2023-04-02 DIAGNOSIS — K219 Gastro-esophageal reflux disease without esophagitis: Secondary | ICD-10-CM | POA: Diagnosis not present

## 2023-04-02 DIAGNOSIS — E785 Hyperlipidemia, unspecified: Secondary | ICD-10-CM | POA: Diagnosis not present

## 2023-04-02 DIAGNOSIS — G8929 Other chronic pain: Secondary | ICD-10-CM | POA: Diagnosis not present

## 2023-04-02 DIAGNOSIS — E854 Organ-limited amyloidosis: Secondary | ICD-10-CM | POA: Diagnosis not present

## 2023-04-02 DIAGNOSIS — I700 Atherosclerosis of aorta: Secondary | ICD-10-CM | POA: Diagnosis not present

## 2023-04-02 DIAGNOSIS — I119 Hypertensive heart disease without heart failure: Secondary | ICD-10-CM | POA: Diagnosis not present

## 2023-04-02 DIAGNOSIS — G4730 Sleep apnea, unspecified: Secondary | ICD-10-CM | POA: Diagnosis not present

## 2023-04-02 DIAGNOSIS — I69351 Hemiplegia and hemiparesis following cerebral infarction affecting right dominant side: Secondary | ICD-10-CM | POA: Diagnosis not present

## 2023-04-02 DIAGNOSIS — D630 Anemia in neoplastic disease: Secondary | ICD-10-CM | POA: Diagnosis not present

## 2023-04-02 DIAGNOSIS — N179 Acute kidney failure, unspecified: Secondary | ICD-10-CM | POA: Diagnosis not present

## 2023-04-02 DIAGNOSIS — I69322 Dysarthria following cerebral infarction: Secondary | ICD-10-CM | POA: Diagnosis not present

## 2023-04-02 DIAGNOSIS — I69354 Hemiplegia and hemiparesis following cerebral infarction affecting left non-dominant side: Secondary | ICD-10-CM | POA: Diagnosis not present

## 2023-04-02 DIAGNOSIS — M549 Dorsalgia, unspecified: Secondary | ICD-10-CM | POA: Diagnosis not present

## 2023-04-02 DIAGNOSIS — N139 Obstructive and reflux uropathy, unspecified: Secondary | ICD-10-CM | POA: Diagnosis not present

## 2023-04-02 DIAGNOSIS — I680 Cerebral amyloid angiopathy: Secondary | ICD-10-CM | POA: Diagnosis not present

## 2023-04-02 NOTE — Transitions of Care (Post Inpatient/ED Visit) (Signed)
04/02/2023  Name: Jessica Ritter MRN: [id number] DOB: [birth year]  Today's TOC FU Call Status: Today's TOC FU Call Status:: Successful TOC FU Call Competed TOC FU Call Complete Date: 04/02/23  Transition Care Management Follow-up Telephone Call Date of Discharge: 03/30/23 Discharge Facility: Other [facility]) Name of Other (Non-[facility]) Discharge Facility: [facility] Primary Inpatient Discharge Diagnosis:: "hematuria,unspecified" How have you been since you were released from the hospital?: Better (Spoke w/ spouse-states pt is doing better-no further blood in urine.) Any questions or concerns?: No  Items Reviewed: Did you receive and understand the discharge instructions provided?: Yes Medications obtained,verified, and reconciled?: Partial Review Completed Reason for Partial Mediation Review: spouse reported no med changes at discharge-did not wish to complete full med review Any new allergies since your discharge?: No Dietary orders reviewed?: Yes Type of Diet Ordered:: low salt/heart healthy Do you have support at home?: Yes People in Home: spouse Name of Support/Comfort Primary Source: Theodoro Grist  Medications Reviewed Today: Medications Reviewed Today     Reviewed by Charlyn Minerva, RN (Registered Nurse) on 04/02/23 at 1559  Med List Status: <None>   Medication Order Taking? Sig Documenting Provider Last Dose Status Informant  aspirin EC 81 MG tablet [id number] No Take 81 mg by mouth daily. [provider] Past Week Active Spouse/Significant Other  atorvastatin (LIPITOR) 80 MG tablet [id number]  TAKE 1 TABLET BY MOUTH ONCE DAILY AT  6  PM Everrett Coombe, DO  Active   Calcium Carbonate-Vitamin D 600-400 MG-UNIT tablet [id number] No Take 1 tablet by mouth 2 (two) times daily. Carlis Stable, [place] Past Week Active Spouse/Significant Other  meloxicam (MOBIC) 7.5 MG tablet [id number] No Take 7.5 mg by mouth daily. [provider] Past Week Active Spouse/Significant Other           Med Note (   Thu Feb 27, 2023  3:11 PM) On hold for procedure  phenazopyridine (PYRIDIUM) 200 MG tablet [id number]  Take 1 tablet (200 mg total) by mouth 3 (three) times daily as needed for pain. Heloise Purpura, MD  Active   traMADol-acetaminophen [facility]) 37.5-325 MG tablet [id number] No Take 1 tablet by mouth every 8 (eight) hours as needed. Everrett Coombe, DO 03/09/2023 Active Spouse/Significant Other  valACYclovir (VALTREX) 500 MG tablet [id number] No TAKE 1 TABLET BY MOUTH TWICE DAILY FOR 3 DAYS AS NEEDED FOR  OUTBREAKS  Patient taking differently: Take 500 mg by mouth 2 (two) times daily as needed (for outbreaks).   Everrett Coombe, DO Unknown Active Spouse/Significant Other            Home Care and Equipment/Supplies: Were Home Health Services Ordered?: NA Any new equipment or medical supplies ordered?: NA  Functional Questionnaire: Do you need assistance with bathing/showering or dressing?: No Do you need assistance with meal preparation?: No Do you need assistance with eating?: No Do you have difficulty maintaining continence: No Do you need assistance with getting out of bed/getting out of a chair/moving?: No Do you have difficulty managing or taking your medications?: No  Follow up appointments reviewed: PCP Follow-up appointment confirmed?: No (Spouse declined making appt at this time-will call office later if he decides to make appt) Specialist Hospital Follow-up appointment confirmed?: Yes Follow-Up Specialty Provider:: sposue confirms pt has urologist appt in a few weeks Do you need transportation to your follow-up appointment?: No Do you understand care options if your condition(s) worsen?: Yes-patient verbalized understanding   TOC Interventions Today    Flowsheet Row Most Recent  Value  TOC Interventions   TOC Interventions Discussed/Reviewed TOC Interventions Discussed      Interventions Today     Flowsheet Row Most Recent Value  General Interventions   General Interventions Discussed/Reviewed General Interventions Discussed, Doctor Visits  Doctor Visits Discussed/Reviewed Doctor Visits Discussed, PCP  PCP/Specialist Visits Compliance with follow-up visit  Education Interventions   Education Provided Provided Education  Provided Verbal Education On When to see the doctor, Medication  Nutrition Interventions   Nutrition Discussed/Reviewed Nutrition Discussed  Pharmacy Interventions   Pharmacy Dicussed/Reviewed Pharmacy Topics Discussed        Alessandra Grout [facility] Care Management Care Management Community Coordinator Direct Phone: [phone] Toll Free: [phone] Fax: [phone]

## 2023-04-02 NOTE — Transitions of Care (Post Inpatient/ED Visit) (Signed)
   04/02/2023  Name: Jessica Ritter MRN: [id number] DOB: January 16, 1961  Today's TOC FU Call Status: Today's TOC FU Call Status:: Unsuccessful Call (2nd Attempt) Unsuccessful Call (2nd Attempt) Date: 04/02/23  Attempted to reach the patient regarding the most recent Inpatient/ED visit.  Follow Up Plan: Additional outreach attempts will be made to reach the patient to complete the Transitions of Care (Post Inpatient/ED visit) call.     Antionette Fairy, RN,BSN,CCM [facility] Care Management Care Management Community Coordinator Direct Phone: [phone] Toll Free: [phone] Fax: [phone]

## 2023-04-03 ENCOUNTER — Encounter: Payer: Self-pay | Admitting: Genetic Counselor

## 2023-04-09 DIAGNOSIS — E785 Hyperlipidemia, unspecified: Secondary | ICD-10-CM | POA: Diagnosis not present

## 2023-04-09 DIAGNOSIS — I119 Hypertensive heart disease without heart failure: Secondary | ICD-10-CM | POA: Diagnosis not present

## 2023-04-09 DIAGNOSIS — G8929 Other chronic pain: Secondary | ICD-10-CM | POA: Diagnosis not present

## 2023-04-09 DIAGNOSIS — I69322 Dysarthria following cerebral infarction: Secondary | ICD-10-CM | POA: Diagnosis not present

## 2023-04-09 DIAGNOSIS — D630 Anemia in neoplastic disease: Secondary | ICD-10-CM | POA: Diagnosis not present

## 2023-04-09 DIAGNOSIS — E854 Organ-limited amyloidosis: Secondary | ICD-10-CM | POA: Diagnosis not present

## 2023-04-09 DIAGNOSIS — I700 Atherosclerosis of aorta: Secondary | ICD-10-CM | POA: Diagnosis not present

## 2023-04-09 DIAGNOSIS — N179 Acute kidney failure, unspecified: Secondary | ICD-10-CM | POA: Diagnosis not present

## 2023-04-09 DIAGNOSIS — C679 Malignant neoplasm of bladder, unspecified: Secondary | ICD-10-CM | POA: Diagnosis not present

## 2023-04-09 DIAGNOSIS — K219 Gastro-esophageal reflux disease without esophagitis: Secondary | ICD-10-CM | POA: Diagnosis not present

## 2023-04-09 DIAGNOSIS — G4730 Sleep apnea, unspecified: Secondary | ICD-10-CM | POA: Diagnosis not present

## 2023-04-09 DIAGNOSIS — I69354 Hemiplegia and hemiparesis following cerebral infarction affecting left non-dominant side: Secondary | ICD-10-CM | POA: Diagnosis not present

## 2023-04-09 DIAGNOSIS — M549 Dorsalgia, unspecified: Secondary | ICD-10-CM | POA: Diagnosis not present

## 2023-04-09 DIAGNOSIS — I69351 Hemiplegia and hemiparesis following cerebral infarction affecting right dominant side: Secondary | ICD-10-CM | POA: Diagnosis not present

## 2023-04-09 DIAGNOSIS — I680 Cerebral amyloid angiopathy: Secondary | ICD-10-CM | POA: Diagnosis not present

## 2023-04-09 DIAGNOSIS — N139 Obstructive and reflux uropathy, unspecified: Secondary | ICD-10-CM | POA: Diagnosis not present

## 2023-04-11 DIAGNOSIS — C676 Malignant neoplasm of ureteric orifice: Secondary | ICD-10-CM | POA: Diagnosis not present

## 2023-04-11 DIAGNOSIS — Z5112 Encounter for antineoplastic immunotherapy: Secondary | ICD-10-CM | POA: Diagnosis not present

## 2023-04-11 DIAGNOSIS — I10 Essential (primary) hypertension: Secondary | ICD-10-CM | POA: Diagnosis not present

## 2023-04-11 DIAGNOSIS — C659 Malignant neoplasm of unspecified renal pelvis: Secondary | ICD-10-CM | POA: Diagnosis not present

## 2023-04-11 DIAGNOSIS — Z79899 Other long term (current) drug therapy: Secondary | ICD-10-CM | POA: Diagnosis not present

## 2023-04-11 DIAGNOSIS — D62 Acute posthemorrhagic anemia: Secondary | ICD-10-CM | POA: Diagnosis not present

## 2023-04-11 DIAGNOSIS — C651 Malignant neoplasm of right renal pelvis: Secondary | ICD-10-CM | POA: Diagnosis not present

## 2023-04-11 DIAGNOSIS — C775 Secondary and unspecified malignant neoplasm of intrapelvic lymph nodes: Secondary | ICD-10-CM | POA: Diagnosis not present

## 2023-04-14 DIAGNOSIS — I700 Atherosclerosis of aorta: Secondary | ICD-10-CM | POA: Diagnosis not present

## 2023-04-14 DIAGNOSIS — G4730 Sleep apnea, unspecified: Secondary | ICD-10-CM | POA: Diagnosis not present

## 2023-04-14 DIAGNOSIS — I119 Hypertensive heart disease without heart failure: Secondary | ICD-10-CM | POA: Diagnosis not present

## 2023-04-14 DIAGNOSIS — C679 Malignant neoplasm of bladder, unspecified: Secondary | ICD-10-CM | POA: Diagnosis not present

## 2023-04-14 DIAGNOSIS — E854 Organ-limited amyloidosis: Secondary | ICD-10-CM | POA: Diagnosis not present

## 2023-04-14 DIAGNOSIS — E785 Hyperlipidemia, unspecified: Secondary | ICD-10-CM | POA: Diagnosis not present

## 2023-04-14 DIAGNOSIS — D630 Anemia in neoplastic disease: Secondary | ICD-10-CM | POA: Diagnosis not present

## 2023-04-14 DIAGNOSIS — G8929 Other chronic pain: Secondary | ICD-10-CM | POA: Diagnosis not present

## 2023-04-14 DIAGNOSIS — I69322 Dysarthria following cerebral infarction: Secondary | ICD-10-CM | POA: Diagnosis not present

## 2023-04-14 DIAGNOSIS — I69354 Hemiplegia and hemiparesis following cerebral infarction affecting left non-dominant side: Secondary | ICD-10-CM | POA: Diagnosis not present

## 2023-04-14 DIAGNOSIS — K219 Gastro-esophageal reflux disease without esophagitis: Secondary | ICD-10-CM | POA: Diagnosis not present

## 2023-04-14 DIAGNOSIS — I680 Cerebral amyloid angiopathy: Secondary | ICD-10-CM | POA: Diagnosis not present

## 2023-04-14 DIAGNOSIS — N139 Obstructive and reflux uropathy, unspecified: Secondary | ICD-10-CM | POA: Diagnosis not present

## 2023-04-14 DIAGNOSIS — I69351 Hemiplegia and hemiparesis following cerebral infarction affecting right dominant side: Secondary | ICD-10-CM | POA: Diagnosis not present

## 2023-04-14 DIAGNOSIS — N179 Acute kidney failure, unspecified: Secondary | ICD-10-CM | POA: Diagnosis not present

## 2023-04-14 DIAGNOSIS — M549 Dorsalgia, unspecified: Secondary | ICD-10-CM | POA: Diagnosis not present

## 2023-04-29 DIAGNOSIS — M549 Dorsalgia, unspecified: Secondary | ICD-10-CM | POA: Diagnosis not present

## 2023-04-29 DIAGNOSIS — I119 Hypertensive heart disease without heart failure: Secondary | ICD-10-CM | POA: Diagnosis not present

## 2023-04-29 DIAGNOSIS — G319 Degenerative disease of nervous system, unspecified: Secondary | ICD-10-CM | POA: Diagnosis not present

## 2023-04-29 DIAGNOSIS — G4730 Sleep apnea, unspecified: Secondary | ICD-10-CM | POA: Diagnosis not present

## 2023-04-29 DIAGNOSIS — D630 Anemia in neoplastic disease: Secondary | ICD-10-CM | POA: Diagnosis not present

## 2023-04-29 DIAGNOSIS — I700 Atherosclerosis of aorta: Secondary | ICD-10-CM | POA: Diagnosis not present

## 2023-04-29 DIAGNOSIS — E785 Hyperlipidemia, unspecified: Secondary | ICD-10-CM | POA: Diagnosis not present

## 2023-04-29 DIAGNOSIS — I69322 Dysarthria following cerebral infarction: Secondary | ICD-10-CM | POA: Diagnosis not present

## 2023-04-29 DIAGNOSIS — I69354 Hemiplegia and hemiparesis following cerebral infarction affecting left non-dominant side: Secondary | ICD-10-CM | POA: Diagnosis not present

## 2023-04-29 DIAGNOSIS — K219 Gastro-esophageal reflux disease without esophagitis: Secondary | ICD-10-CM | POA: Diagnosis not present

## 2023-04-29 DIAGNOSIS — I69351 Hemiplegia and hemiparesis following cerebral infarction affecting right dominant side: Secondary | ICD-10-CM | POA: Diagnosis not present

## 2023-04-29 DIAGNOSIS — C679 Malignant neoplasm of bladder, unspecified: Secondary | ICD-10-CM | POA: Diagnosis not present

## 2023-04-29 DIAGNOSIS — I680 Cerebral amyloid angiopathy: Secondary | ICD-10-CM | POA: Diagnosis not present

## 2023-04-29 DIAGNOSIS — G8929 Other chronic pain: Secondary | ICD-10-CM | POA: Diagnosis not present

## 2023-04-29 DIAGNOSIS — N139 Obstructive and reflux uropathy, unspecified: Secondary | ICD-10-CM | POA: Diagnosis not present

## 2023-04-29 DIAGNOSIS — E854 Organ-limited amyloidosis: Secondary | ICD-10-CM | POA: Diagnosis not present

## 2023-05-05 ENCOUNTER — Encounter: Payer: Self-pay | Admitting: Family Medicine

## 2023-05-05 MED ORDER — CEPHALEXIN 500 MG PO CAPS: 500.0000 mg | ORAL_CAPSULE | Freq: Three times a day (TID) | ORAL | 0 refills | Status: DC

## 2023-05-05 NOTE — Telephone Encounter (Signed)
Rx sent to [facility].  If not improving will need urine specimen.

## 2023-05-06 ENCOUNTER — Ambulatory Visit: Payer: Medicare Other | Admitting: Family Medicine

## 2023-05-06 VITALS — Ht 66.0 in | Wt 153.0 lb

## 2023-05-06 DIAGNOSIS — R310 Gross hematuria: Secondary | ICD-10-CM

## 2023-05-06 DIAGNOSIS — N3001 Acute cystitis with hematuria: Secondary | ICD-10-CM | POA: Diagnosis not present

## 2023-05-06 LAB — POCT URINALYSIS DIP (CLINITEK)
Glucose, UA: NEGATIVE mg/dL
Ketones, POC UA: NEGATIVE mg/dL
Leukocytes, UA: NEGATIVE
Nitrite, UA: NEGATIVE
POC PROTEIN,UA: >=300 — AB
Spec Grav, UA: >=1.03 — AB (ref 1.010–1.025)
Urobilinogen, UA: 0.2 E.U./dL
pH, UA: 5.5 (ref 5.0–8.0)

## 2023-05-06 NOTE — Progress Notes (Signed)
 Medical screening examination/treatment was performed by qualified clinical staff member and as supervising physician I was immediately available for consultation/collaboration. I have reviewed documentation and agree with assessment and plan.  Cody Matthews, DO  

## 2023-05-06 NOTE — Telephone Encounter (Signed)
Patient caregiver Jessica Ritter informed.  He states he dropped off a urine this morning for testing.

## 2023-05-06 NOTE — Progress Notes (Signed)
Pt's spouse brought sample for urinalysis. POCT Urinalysis = completed. Urine sent for culture. ABX sent to pharmacy by Dr. Ashley Royalty.

## 2023-05-07 ENCOUNTER — Emergency Department (HOSPITAL_COMMUNITY)
Admission: EM | Admit: 2023-05-07 | Discharge: 2023-05-07 | Disposition: A | Discharge status: Home / Self Care | Payer: Medicare Other

## 2023-05-07 ENCOUNTER — Encounter (HOSPITAL_COMMUNITY): Payer: Self-pay

## 2023-05-07 ENCOUNTER — Other Ambulatory Visit: Payer: Self-pay

## 2023-05-07 ENCOUNTER — Emergency Department (HOSPITAL_COMMUNITY): Payer: Medicare Other

## 2023-05-07 DIAGNOSIS — Z7982 Long term (current) use of aspirin: Secondary | ICD-10-CM | POA: Insufficient documentation

## 2023-05-07 DIAGNOSIS — I6381 Other cerebral infarction due to occlusion or stenosis of small artery: Secondary | ICD-10-CM | POA: Diagnosis not present

## 2023-05-07 DIAGNOSIS — Z8551 Personal history of malignant neoplasm of bladder: Secondary | ICD-10-CM | POA: Diagnosis not present

## 2023-05-07 DIAGNOSIS — G9389 Other specified disorders of brain: Secondary | ICD-10-CM | POA: Diagnosis not present

## 2023-05-07 DIAGNOSIS — R531 Weakness: Secondary | ICD-10-CM | POA: Insufficient documentation

## 2023-05-07 DIAGNOSIS — R2990 Unspecified symptoms and signs involving the nervous system: Secondary | ICD-10-CM

## 2023-05-07 DIAGNOSIS — I69322 Dysarthria following cerebral infarction: Secondary | ICD-10-CM | POA: Diagnosis not present

## 2023-05-07 DIAGNOSIS — R059 Cough, unspecified: Secondary | ICD-10-CM | POA: Diagnosis not present

## 2023-05-07 DIAGNOSIS — R0989 Other specified symptoms and signs involving the circulatory and respiratory systems: Secondary | ICD-10-CM | POA: Diagnosis not present

## 2023-05-07 DIAGNOSIS — I10 Essential (primary) hypertension: Secondary | ICD-10-CM | POA: Diagnosis not present

## 2023-05-07 DIAGNOSIS — R9089 Other abnormal findings on diagnostic imaging of central nervous system: Secondary | ICD-10-CM | POA: Diagnosis not present

## 2023-05-07 DIAGNOSIS — R569 Unspecified convulsions: Secondary | ICD-10-CM | POA: Insufficient documentation

## 2023-05-07 DIAGNOSIS — I959 Hypotension, unspecified: Secondary | ICD-10-CM | POA: Diagnosis not present

## 2023-05-07 DIAGNOSIS — R29898 Other symptoms and signs involving the musculoskeletal system: Secondary | ICD-10-CM | POA: Diagnosis not present

## 2023-05-07 DIAGNOSIS — E785 Hyperlipidemia, unspecified: Secondary | ICD-10-CM | POA: Diagnosis not present

## 2023-05-07 DIAGNOSIS — R0689 Other abnormalities of breathing: Secondary | ICD-10-CM | POA: Diagnosis not present

## 2023-05-07 DIAGNOSIS — W19XXXA Unspecified fall, initial encounter: Secondary | ICD-10-CM | POA: Diagnosis not present

## 2023-05-07 DIAGNOSIS — I69354 Hemiplegia and hemiparesis following cerebral infarction affecting left non-dominant side: Secondary | ICD-10-CM | POA: Diagnosis not present

## 2023-05-07 DIAGNOSIS — N3000 Acute cystitis without hematuria: Secondary | ICD-10-CM | POA: Insufficient documentation

## 2023-05-07 DIAGNOSIS — R000 Tachycardia, unspecified: Secondary | ICD-10-CM | POA: Diagnosis not present

## 2023-05-07 LAB — CBC WITH DIFFERENTIAL/PLATELET
Abs Immature Granulocytes: 0.02 10*3/uL (ref 0.00–0.07)
Basophils Absolute: 0.1 10*3/uL (ref 0.0–0.1)
Basophils Relative: 1 %
Eosinophils Absolute: 0.2 10*3/uL (ref 0.0–0.5)
Eosinophils Relative: 3 %
HCT: 34.8 % — ABNORMAL LOW (ref 36.0–46.0)
Hemoglobin: 10.6 g/dL — ABNORMAL LOW (ref 12.0–15.0)
Immature Granulocytes: 0 %
Lymphocytes Relative: 22 %
Lymphs Abs: 1.4 10*3/uL (ref 0.7–4.0)
MCH: 27.7 pg (ref 26.0–34.0)
MCHC: 30.5 g/dL (ref 30.0–36.0)
MCV: 90.9 fL (ref 80.0–100.0)
Monocytes Absolute: 0.5 10*3/uL (ref 0.1–1.0)
Monocytes Relative: 9 %
Neutro Abs: 4 10*3/uL (ref 1.7–7.7)
Neutrophils Relative %: 65 %
Platelets: 234 10*3/uL (ref 150–400)
RBC: 3.83 MIL/uL — ABNORMAL LOW (ref 3.87–5.11)
RDW: 14.4 % (ref 11.5–15.5)
WBC: 6.1 10*3/uL (ref 4.0–10.5)
nRBC: 0 % (ref 0.0–0.2)

## 2023-05-07 LAB — URINALYSIS, ROUTINE W REFLEX MICROSCOPIC
Bilirubin Urine: NEGATIVE
Glucose, UA: NEGATIVE mg/dL
Ketones, ur: NEGATIVE mg/dL
Nitrite: NEGATIVE
Protein, ur: 30 mg/dL — AB
RBC / HPF: >50 RBC/hpf (ref 0–5)
Specific Gravity, Urine: 1.02 (ref 1.005–1.030)
WBC, UA: >50 WBC/hpf (ref 0–5)
pH: 5 (ref 5.0–8.0)

## 2023-05-07 LAB — URINE CULTURE
MICRO NUMBER:: 15206364
SPECIMEN QUALITY:: ADEQUATE

## 2023-05-07 LAB — COMPREHENSIVE METABOLIC PANEL
ALT: 42 U/L (ref 0–44)
AST: 54 U/L — ABNORMAL HIGH (ref 15–41)
Albumin: 3.3 g/dL — ABNORMAL LOW (ref 3.5–5.0)
Alkaline Phosphatase: 72 U/L (ref 38–126)
Anion gap: 6 (ref 5–15)
BUN: 51 mg/dL — ABNORMAL HIGH (ref 6–20)
CO2: 19 mmol/L — ABNORMAL LOW (ref 22–32)
Calcium: 9.4 mg/dL (ref 8.9–10.3)
Chloride: 118 mmol/L — ABNORMAL HIGH (ref 98–111)
Creatinine, Ser: 0.87 mg/dL (ref 0.44–1.00)
GFR, Estimated: >60 mL/min (ref >60)
Glucose, Bld: 103 mg/dL — ABNORMAL HIGH (ref 70–99)
Potassium: 4.2 mmol/L (ref 3.5–5.1)
Sodium: 143 mmol/L (ref 135–145)
Total Bilirubin: 1.5 mg/dL — ABNORMAL HIGH (ref 0.3–1.2)
Total Protein: 6.4 g/dL — ABNORMAL LOW (ref 6.5–8.1)

## 2023-05-07 LAB — RAPID URINE DRUG SCREEN, HOSP PERFORMED
Amphetamines: NOT DETECTED
Barbiturates: NOT DETECTED
Benzodiazepines: NOT DETECTED
Cocaine: NOT DETECTED
Opiates: NOT DETECTED
Tetrahydrocannabinol: NOT DETECTED

## 2023-05-07 LAB — PROTIME-INR
INR: 1.1 (ref 0.8–1.2)
Prothrombin Time: 14.5 seconds (ref 11.4–15.2)

## 2023-05-07 LAB — APTT: aPTT: 25 seconds (ref 24–36)

## 2023-05-07 LAB — CBG MONITORING, ED: Glucose-Capillary: 109 mg/dL — ABNORMAL HIGH (ref 70–99)

## 2023-05-07 LAB — ETHANOL: Alcohol, Ethyl (B): <10 mg/dL (ref <10)

## 2023-05-07 LAB — TROPONIN I (HIGH SENSITIVITY): Troponin I (High Sensitivity): 4 ng/L (ref <18)

## 2023-05-07 MED ORDER — FLUCONAZOLE 150 MG PO TABS: 150.0000 mg | ORAL_TABLET | Freq: Once | ORAL | 0 refills | Status: AC

## 2023-05-07 MED ORDER — SODIUM CHLORIDE 0.9 % IV SOLN
1.0000 g | Freq: Once | INTRAVENOUS | Status: AC
  Administered 2023-05-07: 1 g via INTRAVENOUS
  Filled 2023-05-07: qty 10

## 2023-05-07 MED ORDER — CEFIXIME 400 MG PO CAPS: 400.0000 mg | ORAL_CAPSULE | Freq: Every day | ORAL | 0 refills | Status: DC

## 2023-05-07 NOTE — ED Notes (Signed)
Called for UDS states they will start running

## 2023-05-07 NOTE — ED Provider Notes (Addendum)
[facility] AT [facility] Provider Note   CSN: [id number] Arrival date & time: 05/07/23  1330     History  Chief Complaint  Patient presents with   Weakness    Jessica Ritter is a 62 y.o. female.  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  Symptoms started this past Thursday and have been ongoing.  Has been stating that patient does have some baseline dysarthria, but is able to speak intelligibly, that has not been the case since that time.  Also notes some left upper extremity and lower extremity weakness.  He states that this has happened in the past and has been secondary to UTI.  He spoke with primary doctor and patient has been taking Keflex for the past few days and has not had any improvement.  Patient ambulates with walker at baseline and is somewhat dependent with ADLs, however she is unable to stand and coordinate movement of the lower extremities.  No headache, vision changes.  Weakness seemingly worsened upper extremity lower extremity.  Dysarthria.  Denies any fevers, chills, chest pain, cough, nausea vomiting or diarrhea.     Home Medications Prior to Admission medications   Medication Sig Start Date End Date Taking? Authorizing Provider  aspirin EC 81 MG tablet Take 81 mg by mouth daily.    [provider]  atorvastatin (LIPITOR) 80 MG tablet TAKE 1 TABLET BY MOUTH ONCE DAILY AT  6  PM 03/10/23   Everrett Coombe, DO  Calcium Carbonate-Vitamin D 600-400 MG-UNIT tablet Take 1 tablet by mouth 2 (two) times daily. 04/05/19   Carlis Stable, PA-C  cephALEXin (KEFLEX) 500 MG capsule Take 1 capsule (500 mg total) by mouth 3 (three) times daily for 7 days. 05/05/23  05/12/23  Everrett Coombe, DO  meloxicam (MOBIC) 7.5 MG tablet Take 7.5 mg by mouth daily. 02/16/23   [provider]  phenazopyridine (PYRIDIUM) 200 MG tablet Take 1 tablet (200 mg total) by mouth 3 (three) times daily as needed for pain. 03/10/23   Heloise Purpura, MD  traMADol-acetaminophen (ULTRACET) 37.5-325 MG tablet Take 1 tablet by mouth every 8 (eight) hours as needed. 02/21/23   Everrett Coombe, DO  valACYclovir (VALTREX) 500 MG tablet TAKE 1 TABLET BY MOUTH TWICE DAILY FOR 3 DAYS AS NEEDED FOR  OUTBREAKS Patient taking differently: Take 500 mg by mouth 2 (two) times daily as needed (for outbreaks). 12/18/22   Everrett Coombe, DO      Allergies    Anticoagulant compound, Wasp venom protein, Bee venom, Warfarin, Tape, and Ciprofloxacin    Review of Systems   Review of Systems  Neurological:  Positive for weakness.    Physical Exam Updated Vital Signs BP (!) 83/70   Pulse 85   Temp 98.7 F (37.1 C) (Rectal)   Resp (!) 21   Ht 5\' 6"  (1.676 m)   Wt 69.4 kg   SpO2 95%   BMI 24.69 kg/m  Physical Exam Vitals and nursing note reviewed.  Constitutional:      General: She is not in acute distress.    Appearance: She is not toxic-appearing.     Comments: Chronically ill appearing  HENT:     Head: Normocephalic and atraumatic.     Nose: Nose normal.     Mouth/Throat:  Mouth: Mucous membranes are moist.  Cardiovascular:     Rate and Rhythm: Normal rate and regular rhythm.  Pulmonary:     Effort: Pulmonary effort is normal.     Breath sounds: Normal breath sounds.  Abdominal:     General: Abdomen is flat. There is no distension.     Palpations: Abdomen is soft.     Tenderness: There is no abdominal tenderness. There is no guarding or rebound.  Musculoskeletal:        General: Normal range of motion.  Skin:    General: Skin is warm and dry.     Capillary Refill: Capillary refill takes less than 2 seconds.  Neurological:     Mental Status: She is alert.      Comments: Patient with significant dysarthria, but seemingly answering questions appropriately.  No facial droop.  EOM intact.  She has 4 out of 5 strength biceps triceps on left compared to right.  Appears to have equal strength in bilateral lower extremities.  Normal sensation throughout all extremities.  Normal heel-to-shin.  Psychiatric:        Mood and Affect: Mood normal.        Behavior: Behavior normal.     ED Results / Procedures / Treatments   Labs (all labs ordered are listed, but only abnormal results are displayed) Labs Reviewed  CBC WITH DIFFERENTIAL/PLATELET - Abnormal; Notable for the following components:      Result Value   RBC 3.83 (*)    Hemoglobin 10.6 (*)    HCT 34.8 (*)    All other components within normal limits  COMPREHENSIVE METABOLIC PANEL - Abnormal; Notable for the following components:   Chloride 118 (*)    CO2 19 (*)    Glucose, Bld 103 (*)    BUN 51 (*)    Total Protein 6.4 (*)    Albumin 3.3 (*)    AST 54 (*)    Total Bilirubin 1.5 (*)    All other components within normal limits  CBG MONITORING, ED - Abnormal; Notable for the following components:   Glucose-Capillary 109 (*)    All other components within normal limits  ETHANOL  PROTIME-INR  APTT  URINALYSIS, ROUTINE W REFLEX MICROSCOPIC  RAPID URINE DRUG SCREEN, HOSP PERFORMED  TROPONIN I (HIGH SENSITIVITY)  TROPONIN I (HIGH SENSITIVITY)    EKG None  Radiology DG Chest Portable 1 View  Result Date: 05/07/2023 CLINICAL DATA:  Cough and congestion EXAM: PORTABLE CHEST 1 VIEW COMPARISON:  None Available. FINDINGS: The cardiomediastinal silhouette is normal. There is no focal consolidation or pulmonary edema. There is no pleural effusion or pneumothorax There is no acute osseous abnormality. IMPRESSION: No radiographic evidence of acute cardiopulmonary process. Electronically Signed   By: Lesia Hausen M.D.   On: 05/07/2023 15:41    Procedures Procedures    Medications Ordered in  ED Medications - No data to display  ED Course/ Medical Decision Making/ A&P Clinical Course as of 05/24/23 1102  Wed May 07, 2023  1437 CBC with Differential(!) No leukocytosis to suggest systemic infection. [TY]  1953 MRI does not show any acute abnormality.  Encephalomalacia noted.  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  Concern for possible stroke versus complicated UTI versus other encephalopathy.  History obtained by husband as patient is quite difficult to understand.  She does have significant dysarthria which is reported to be significantly worse than baseline as well as left-sided weakness.  Patient does have documented residual left-sided weakness from prior stroke, however patient and husband say it is very minimal and appears to be significantly worse today.  Given duration of symptoms patient is well outside the range of any acute CVA intervention.  Other labs show no significant leukocytosis or fever to suggest systemic infection.  Comprehensive panel with a low bicarb, but no other significant metabolic derangements.  Troponin negative.  EKG without arrhythmia or ST segment changes to get ischemia.  Urine has been.  Husband does state that some of her symptoms today have been secondary to UTI in the past.  MRI also pending to evaluate for stroke.  Care signed out to afternoon team disposition pending completion of  workup.  Amount and/or Complexity of Data Reviewed Labs: ordered. Decision-making details documented in ED Course. Radiology: ordered.   Coags ordered to evaluate for coagulopathy in case MRI shows intracranial hemorrage; Coags WNL.       Final Clinical Impression(s) / ED Diagnoses Final diagnoses:  None    Rx / DC Orders ED Discharge Orders     None         Coral Spikes, DO 05/07/23 1617    Coral Spikes, DO 05/24/23 1103

## 2023-05-07 NOTE — Discharge Instructions (Addendum)
Stop taking the Keflex.  Start taking the cefixime.  Return to the ER if you start having fevers chills or other concerning symptoms.  Follow-up with your doctor to be rechecked.  The diflucan tablet is to help prevent yeast infection associated with the antibiotic

## 2023-05-07 NOTE — ED Notes (Signed)
Pt to MRI

## 2023-05-07 NOTE — ED Provider Notes (Signed)
Clinical Course as of 05/07/23 2045  Wed May 07, 2023  1437 CBC with Differential(!) No leukocytosis to suggest systemic infection. [TY]  1953 MRI does not show any acute abnormality.  Encephalomalacia noted.  Chronic microhemorrhages reflective of possible prior emboli trauma hypertension or cerebral amyloid angiopathy [JK]  2032 Urinalysis is consistent with urinary tract infection [JK]    Clinical Course User Index [JK] Linwood Dibbles, MD [TY] Coral Spikes, DO   Patient was seen by Dr. Maple Hudson initially.  Please see his note.  Plan was to follow-up on the patient's MRI and urinalysis.  The MRI does not show any signs of acute stroke.  It does show her known chronic findings.  Patient's urinalysis does suggest infection.  I will give the patient dose of Rocephin.  Discussed the findings with the patient and her husband.  They were primarily concerned that she had a recurrent stroke.  No evidence of that right now on the MRI.  Patient has had trouble with increasing weakness with urinary tract infections.  They are comfortable trying to manage this at home.  Patient does not want to be admitted to the hospital.  She is currently taking Keflex.  I will change her to cefuroxime.  Patient was given a dose of Rocephin in the ED.   Linwood Dibbles, MD 05/07/23 2045

## 2023-05-07 NOTE — ED Notes (Signed)
Pt BIBA for left side weakness for 2 weeks.Pt has hx of previous stroke with minimal residual generalized weakness. Husband states he thought she had a UTI as her symptoms present similar in the past. She was placed on Antibiotics after he called her MD. Patient has taken 5 doses, no improvement. Patient is AAOX4, MAE, NAD noted. Resp even and unlabored. Denies pain at this time.

## 2023-05-07 NOTE — ED Notes (Signed)
 Green top sent to lab

## 2023-05-08 DIAGNOSIS — K219 Gastro-esophageal reflux disease without esophagitis: Secondary | ICD-10-CM | POA: Diagnosis not present

## 2023-05-08 DIAGNOSIS — I69322 Dysarthria following cerebral infarction: Secondary | ICD-10-CM | POA: Diagnosis not present

## 2023-05-08 DIAGNOSIS — G319 Degenerative disease of nervous system, unspecified: Secondary | ICD-10-CM | POA: Diagnosis not present

## 2023-05-08 DIAGNOSIS — G4730 Sleep apnea, unspecified: Secondary | ICD-10-CM | POA: Diagnosis not present

## 2023-05-08 DIAGNOSIS — I69354 Hemiplegia and hemiparesis following cerebral infarction affecting left non-dominant side: Secondary | ICD-10-CM | POA: Diagnosis not present

## 2023-05-08 DIAGNOSIS — C679 Malignant neoplasm of bladder, unspecified: Secondary | ICD-10-CM | POA: Diagnosis not present

## 2023-05-08 DIAGNOSIS — I119 Hypertensive heart disease without heart failure: Secondary | ICD-10-CM | POA: Diagnosis not present

## 2023-05-08 DIAGNOSIS — E854 Organ-limited amyloidosis: Secondary | ICD-10-CM | POA: Diagnosis not present

## 2023-05-08 DIAGNOSIS — D630 Anemia in neoplastic disease: Secondary | ICD-10-CM | POA: Diagnosis not present

## 2023-05-08 DIAGNOSIS — I680 Cerebral amyloid angiopathy: Secondary | ICD-10-CM | POA: Diagnosis not present

## 2023-05-08 DIAGNOSIS — M549 Dorsalgia, unspecified: Secondary | ICD-10-CM | POA: Diagnosis not present

## 2023-05-08 DIAGNOSIS — I69351 Hemiplegia and hemiparesis following cerebral infarction affecting right dominant side: Secondary | ICD-10-CM | POA: Diagnosis not present

## 2023-05-08 DIAGNOSIS — G8929 Other chronic pain: Secondary | ICD-10-CM | POA: Diagnosis not present

## 2023-05-08 DIAGNOSIS — E785 Hyperlipidemia, unspecified: Secondary | ICD-10-CM | POA: Diagnosis not present

## 2023-05-08 DIAGNOSIS — N139 Obstructive and reflux uropathy, unspecified: Secondary | ICD-10-CM | POA: Diagnosis not present

## 2023-05-08 DIAGNOSIS — I700 Atherosclerosis of aorta: Secondary | ICD-10-CM | POA: Diagnosis not present

## 2023-05-09 LAB — URINE CULTURE: Culture: NO GROWTH

## 2023-05-12 DIAGNOSIS — C678 Malignant neoplasm of overlapping sites of bladder: Secondary | ICD-10-CM | POA: Diagnosis not present

## 2023-05-12 DIAGNOSIS — Z79899 Other long term (current) drug therapy: Secondary | ICD-10-CM | POA: Diagnosis not present

## 2023-05-14 DIAGNOSIS — D630 Anemia in neoplastic disease: Secondary | ICD-10-CM | POA: Diagnosis not present

## 2023-05-14 DIAGNOSIS — E785 Hyperlipidemia, unspecified: Secondary | ICD-10-CM | POA: Diagnosis not present

## 2023-05-14 DIAGNOSIS — G319 Degenerative disease of nervous system, unspecified: Secondary | ICD-10-CM | POA: Diagnosis not present

## 2023-05-14 DIAGNOSIS — I680 Cerebral amyloid angiopathy: Secondary | ICD-10-CM | POA: Diagnosis not present

## 2023-05-14 DIAGNOSIS — E854 Organ-limited amyloidosis: Secondary | ICD-10-CM | POA: Diagnosis not present

## 2023-05-14 DIAGNOSIS — I69354 Hemiplegia and hemiparesis following cerebral infarction affecting left non-dominant side: Secondary | ICD-10-CM | POA: Diagnosis not present

## 2023-05-14 DIAGNOSIS — I69322 Dysarthria following cerebral infarction: Secondary | ICD-10-CM | POA: Diagnosis not present

## 2023-05-14 DIAGNOSIS — M549 Dorsalgia, unspecified: Secondary | ICD-10-CM | POA: Diagnosis not present

## 2023-05-14 DIAGNOSIS — I69351 Hemiplegia and hemiparesis following cerebral infarction affecting right dominant side: Secondary | ICD-10-CM | POA: Diagnosis not present

## 2023-05-14 DIAGNOSIS — I700 Atherosclerosis of aorta: Secondary | ICD-10-CM | POA: Diagnosis not present

## 2023-05-14 DIAGNOSIS — N139 Obstructive and reflux uropathy, unspecified: Secondary | ICD-10-CM | POA: Diagnosis not present

## 2023-05-14 DIAGNOSIS — I119 Hypertensive heart disease without heart failure: Secondary | ICD-10-CM | POA: Diagnosis not present

## 2023-05-14 DIAGNOSIS — G8929 Other chronic pain: Secondary | ICD-10-CM | POA: Diagnosis not present

## 2023-05-14 DIAGNOSIS — G4730 Sleep apnea, unspecified: Secondary | ICD-10-CM | POA: Diagnosis not present

## 2023-05-14 DIAGNOSIS — K219 Gastro-esophageal reflux disease without esophagitis: Secondary | ICD-10-CM | POA: Diagnosis not present

## 2023-05-14 DIAGNOSIS — C679 Malignant neoplasm of bladder, unspecified: Secondary | ICD-10-CM | POA: Diagnosis not present

## 2023-05-19 DIAGNOSIS — K219 Gastro-esophageal reflux disease without esophagitis: Secondary | ICD-10-CM | POA: Diagnosis not present

## 2023-05-19 DIAGNOSIS — M549 Dorsalgia, unspecified: Secondary | ICD-10-CM | POA: Diagnosis not present

## 2023-05-19 DIAGNOSIS — G4730 Sleep apnea, unspecified: Secondary | ICD-10-CM | POA: Diagnosis not present

## 2023-05-19 DIAGNOSIS — E785 Hyperlipidemia, unspecified: Secondary | ICD-10-CM | POA: Diagnosis not present

## 2023-05-19 DIAGNOSIS — E854 Organ-limited amyloidosis: Secondary | ICD-10-CM | POA: Diagnosis not present

## 2023-05-19 DIAGNOSIS — I119 Hypertensive heart disease without heart failure: Secondary | ICD-10-CM | POA: Diagnosis not present

## 2023-05-19 DIAGNOSIS — I680 Cerebral amyloid angiopathy: Secondary | ICD-10-CM | POA: Diagnosis not present

## 2023-05-19 DIAGNOSIS — I700 Atherosclerosis of aorta: Secondary | ICD-10-CM | POA: Diagnosis not present

## 2023-05-19 DIAGNOSIS — N139 Obstructive and reflux uropathy, unspecified: Secondary | ICD-10-CM | POA: Diagnosis not present

## 2023-05-19 DIAGNOSIS — D630 Anemia in neoplastic disease: Secondary | ICD-10-CM | POA: Diagnosis not present

## 2023-05-19 DIAGNOSIS — I69354 Hemiplegia and hemiparesis following cerebral infarction affecting left non-dominant side: Secondary | ICD-10-CM | POA: Diagnosis not present

## 2023-05-19 DIAGNOSIS — I69351 Hemiplegia and hemiparesis following cerebral infarction affecting right dominant side: Secondary | ICD-10-CM | POA: Diagnosis not present

## 2023-05-19 DIAGNOSIS — C679 Malignant neoplasm of bladder, unspecified: Secondary | ICD-10-CM | POA: Diagnosis not present

## 2023-05-19 DIAGNOSIS — I69322 Dysarthria following cerebral infarction: Secondary | ICD-10-CM | POA: Diagnosis not present

## 2023-05-19 DIAGNOSIS — G8929 Other chronic pain: Secondary | ICD-10-CM | POA: Diagnosis not present

## 2023-05-19 DIAGNOSIS — G319 Degenerative disease of nervous system, unspecified: Secondary | ICD-10-CM | POA: Diagnosis not present

## 2023-05-23 DIAGNOSIS — C678 Malignant neoplasm of overlapping sites of bladder: Secondary | ICD-10-CM | POA: Diagnosis not present

## 2023-05-23 DIAGNOSIS — Z79899 Other long term (current) drug therapy: Secondary | ICD-10-CM | POA: Diagnosis not present

## 2023-05-23 DIAGNOSIS — R404 Transient alteration of awareness: Secondary | ICD-10-CM | POA: Diagnosis not present

## 2023-05-23 DIAGNOSIS — C679 Malignant neoplasm of bladder, unspecified: Secondary | ICD-10-CM | POA: Diagnosis not present

## 2023-05-23 DIAGNOSIS — I10 Essential (primary) hypertension: Secondary | ICD-10-CM | POA: Diagnosis not present

## 2023-05-23 DIAGNOSIS — Z7982 Long term (current) use of aspirin: Secondary | ICD-10-CM | POA: Diagnosis not present

## 2023-05-23 DIAGNOSIS — E785 Hyperlipidemia, unspecified: Secondary | ICD-10-CM | POA: Diagnosis not present

## 2023-05-23 DIAGNOSIS — Z5112 Encounter for antineoplastic immunotherapy: Secondary | ICD-10-CM | POA: Diagnosis not present

## 2023-05-26 DIAGNOSIS — G319 Degenerative disease of nervous system, unspecified: Secondary | ICD-10-CM | POA: Diagnosis not present

## 2023-05-26 DIAGNOSIS — D630 Anemia in neoplastic disease: Secondary | ICD-10-CM | POA: Diagnosis not present

## 2023-05-26 DIAGNOSIS — I69351 Hemiplegia and hemiparesis following cerebral infarction affecting right dominant side: Secondary | ICD-10-CM | POA: Diagnosis not present

## 2023-05-26 DIAGNOSIS — I680 Cerebral amyloid angiopathy: Secondary | ICD-10-CM | POA: Diagnosis not present

## 2023-05-26 DIAGNOSIS — I69354 Hemiplegia and hemiparesis following cerebral infarction affecting left non-dominant side: Secondary | ICD-10-CM | POA: Diagnosis not present

## 2023-05-26 DIAGNOSIS — I69322 Dysarthria following cerebral infarction: Secondary | ICD-10-CM | POA: Diagnosis not present

## 2023-05-26 DIAGNOSIS — G8929 Other chronic pain: Secondary | ICD-10-CM | POA: Diagnosis not present

## 2023-05-26 DIAGNOSIS — C679 Malignant neoplasm of bladder, unspecified: Secondary | ICD-10-CM | POA: Diagnosis not present

## 2023-05-26 DIAGNOSIS — K219 Gastro-esophageal reflux disease without esophagitis: Secondary | ICD-10-CM | POA: Diagnosis not present

## 2023-05-26 DIAGNOSIS — N139 Obstructive and reflux uropathy, unspecified: Secondary | ICD-10-CM | POA: Diagnosis not present

## 2023-05-26 DIAGNOSIS — E854 Organ-limited amyloidosis: Secondary | ICD-10-CM | POA: Diagnosis not present

## 2023-05-26 DIAGNOSIS — G4730 Sleep apnea, unspecified: Secondary | ICD-10-CM | POA: Diagnosis not present

## 2023-05-26 DIAGNOSIS — M549 Dorsalgia, unspecified: Secondary | ICD-10-CM | POA: Diagnosis not present

## 2023-05-26 DIAGNOSIS — I119 Hypertensive heart disease without heart failure: Secondary | ICD-10-CM | POA: Diagnosis not present

## 2023-05-26 DIAGNOSIS — E785 Hyperlipidemia, unspecified: Secondary | ICD-10-CM | POA: Diagnosis not present

## 2023-05-26 DIAGNOSIS — I700 Atherosclerosis of aorta: Secondary | ICD-10-CM | POA: Diagnosis not present

## 2023-06-03 DIAGNOSIS — I700 Atherosclerosis of aorta: Secondary | ICD-10-CM | POA: Diagnosis not present

## 2023-06-03 DIAGNOSIS — N139 Obstructive and reflux uropathy, unspecified: Secondary | ICD-10-CM | POA: Diagnosis not present

## 2023-06-03 DIAGNOSIS — K219 Gastro-esophageal reflux disease without esophagitis: Secondary | ICD-10-CM | POA: Diagnosis not present

## 2023-06-03 DIAGNOSIS — E854 Organ-limited amyloidosis: Secondary | ICD-10-CM | POA: Diagnosis not present

## 2023-06-03 DIAGNOSIS — G319 Degenerative disease of nervous system, unspecified: Secondary | ICD-10-CM | POA: Diagnosis not present

## 2023-06-03 DIAGNOSIS — G4730 Sleep apnea, unspecified: Secondary | ICD-10-CM | POA: Diagnosis not present

## 2023-06-03 DIAGNOSIS — I119 Hypertensive heart disease without heart failure: Secondary | ICD-10-CM | POA: Diagnosis not present

## 2023-06-03 DIAGNOSIS — I69351 Hemiplegia and hemiparesis following cerebral infarction affecting right dominant side: Secondary | ICD-10-CM | POA: Diagnosis not present

## 2023-06-03 DIAGNOSIS — M549 Dorsalgia, unspecified: Secondary | ICD-10-CM | POA: Diagnosis not present

## 2023-06-03 DIAGNOSIS — I69322 Dysarthria following cerebral infarction: Secondary | ICD-10-CM | POA: Diagnosis not present

## 2023-06-03 DIAGNOSIS — D630 Anemia in neoplastic disease: Secondary | ICD-10-CM | POA: Diagnosis not present

## 2023-06-03 DIAGNOSIS — I680 Cerebral amyloid angiopathy: Secondary | ICD-10-CM | POA: Diagnosis not present

## 2023-06-03 DIAGNOSIS — I69354 Hemiplegia and hemiparesis following cerebral infarction affecting left non-dominant side: Secondary | ICD-10-CM | POA: Diagnosis not present

## 2023-06-03 DIAGNOSIS — C679 Malignant neoplasm of bladder, unspecified: Secondary | ICD-10-CM | POA: Diagnosis not present

## 2023-06-03 DIAGNOSIS — E785 Hyperlipidemia, unspecified: Secondary | ICD-10-CM | POA: Diagnosis not present

## 2023-06-03 DIAGNOSIS — G8929 Other chronic pain: Secondary | ICD-10-CM | POA: Diagnosis not present

## 2023-06-05 ENCOUNTER — Telehealth: Payer: Self-pay | Admitting: Family Medicine

## 2023-06-05 NOTE — Telephone Encounter (Signed)
Glynnis, Physical Therapist with [facility] called. Patient has been making good progress with PT until she got a UTI. She is at the end of her certification. Can we do an order for 1 time a week for 6 weeks to work on gait, balance and endurance?

## 2023-06-06 DIAGNOSIS — N131 Hydronephrosis with ureteral stricture, not elsewhere classified: Secondary | ICD-10-CM | POA: Diagnosis not present

## 2023-06-06 DIAGNOSIS — C678 Malignant neoplasm of overlapping sites of bladder: Secondary | ICD-10-CM | POA: Diagnosis not present

## 2023-06-06 DIAGNOSIS — N390 Urinary tract infection, site not specified: Secondary | ICD-10-CM | POA: Diagnosis not present

## 2023-06-10 ENCOUNTER — Other Ambulatory Visit: Payer: Self-pay | Admitting: Urology

## 2023-06-10 DIAGNOSIS — C679 Malignant neoplasm of bladder, unspecified: Secondary | ICD-10-CM | POA: Diagnosis not present

## 2023-06-10 DIAGNOSIS — R918 Other nonspecific abnormal finding of lung field: Secondary | ICD-10-CM | POA: Diagnosis not present

## 2023-06-10 DIAGNOSIS — R590 Localized enlarged lymph nodes: Secondary | ICD-10-CM | POA: Diagnosis not present

## 2023-06-10 DIAGNOSIS — C678 Malignant neoplasm of overlapping sites of bladder: Secondary | ICD-10-CM | POA: Diagnosis not present

## 2023-06-10 DIAGNOSIS — N1330 Unspecified hydronephrosis: Secondary | ICD-10-CM | POA: Diagnosis not present

## 2023-06-10 DIAGNOSIS — K769 Liver disease, unspecified: Secondary | ICD-10-CM | POA: Diagnosis not present

## 2023-06-11 ENCOUNTER — Other Ambulatory Visit: Payer: Self-pay | Admitting: Family Medicine

## 2023-06-11 DIAGNOSIS — E782 Mixed hyperlipidemia: Secondary | ICD-10-CM

## 2023-06-12 DIAGNOSIS — I69322 Dysarthria following cerebral infarction: Secondary | ICD-10-CM | POA: Diagnosis not present

## 2023-06-12 DIAGNOSIS — D630 Anemia in neoplastic disease: Secondary | ICD-10-CM | POA: Diagnosis not present

## 2023-06-12 DIAGNOSIS — I69351 Hemiplegia and hemiparesis following cerebral infarction affecting right dominant side: Secondary | ICD-10-CM | POA: Diagnosis not present

## 2023-06-12 DIAGNOSIS — I119 Hypertensive heart disease without heart failure: Secondary | ICD-10-CM | POA: Diagnosis not present

## 2023-06-12 DIAGNOSIS — M549 Dorsalgia, unspecified: Secondary | ICD-10-CM | POA: Diagnosis not present

## 2023-06-12 DIAGNOSIS — G8929 Other chronic pain: Secondary | ICD-10-CM | POA: Diagnosis not present

## 2023-06-12 DIAGNOSIS — K219 Gastro-esophageal reflux disease without esophagitis: Secondary | ICD-10-CM | POA: Diagnosis not present

## 2023-06-12 DIAGNOSIS — C679 Malignant neoplasm of bladder, unspecified: Secondary | ICD-10-CM | POA: Diagnosis not present

## 2023-06-12 DIAGNOSIS — G319 Degenerative disease of nervous system, unspecified: Secondary | ICD-10-CM | POA: Diagnosis not present

## 2023-06-12 DIAGNOSIS — I69354 Hemiplegia and hemiparesis following cerebral infarction affecting left non-dominant side: Secondary | ICD-10-CM | POA: Diagnosis not present

## 2023-06-12 DIAGNOSIS — I680 Cerebral amyloid angiopathy: Secondary | ICD-10-CM | POA: Diagnosis not present

## 2023-06-12 DIAGNOSIS — I700 Atherosclerosis of aorta: Secondary | ICD-10-CM | POA: Diagnosis not present

## 2023-06-12 DIAGNOSIS — E854 Organ-limited amyloidosis: Secondary | ICD-10-CM | POA: Diagnosis not present

## 2023-06-12 DIAGNOSIS — G4730 Sleep apnea, unspecified: Secondary | ICD-10-CM | POA: Diagnosis not present

## 2023-06-12 DIAGNOSIS — N139 Obstructive and reflux uropathy, unspecified: Secondary | ICD-10-CM | POA: Diagnosis not present

## 2023-06-12 DIAGNOSIS — E785 Hyperlipidemia, unspecified: Secondary | ICD-10-CM | POA: Diagnosis not present

## 2023-06-19 DIAGNOSIS — G4730 Sleep apnea, unspecified: Secondary | ICD-10-CM | POA: Diagnosis not present

## 2023-06-19 DIAGNOSIS — N139 Obstructive and reflux uropathy, unspecified: Secondary | ICD-10-CM | POA: Diagnosis not present

## 2023-06-19 DIAGNOSIS — N390 Urinary tract infection, site not specified: Secondary | ICD-10-CM | POA: Diagnosis not present

## 2023-06-19 DIAGNOSIS — I119 Hypertensive heart disease without heart failure: Secondary | ICD-10-CM | POA: Diagnosis not present

## 2023-06-19 DIAGNOSIS — D630 Anemia in neoplastic disease: Secondary | ICD-10-CM | POA: Diagnosis not present

## 2023-06-19 DIAGNOSIS — G319 Degenerative disease of nervous system, unspecified: Secondary | ICD-10-CM | POA: Diagnosis not present

## 2023-06-19 DIAGNOSIS — I69354 Hemiplegia and hemiparesis following cerebral infarction affecting left non-dominant side: Secondary | ICD-10-CM | POA: Diagnosis not present

## 2023-06-19 DIAGNOSIS — I680 Cerebral amyloid angiopathy: Secondary | ICD-10-CM | POA: Diagnosis not present

## 2023-06-19 DIAGNOSIS — C679 Malignant neoplasm of bladder, unspecified: Secondary | ICD-10-CM | POA: Diagnosis not present

## 2023-06-19 DIAGNOSIS — E854 Organ-limited amyloidosis: Secondary | ICD-10-CM | POA: Diagnosis not present

## 2023-06-19 DIAGNOSIS — M549 Dorsalgia, unspecified: Secondary | ICD-10-CM | POA: Diagnosis not present

## 2023-06-19 DIAGNOSIS — E785 Hyperlipidemia, unspecified: Secondary | ICD-10-CM | POA: Diagnosis not present

## 2023-06-19 DIAGNOSIS — G8929 Other chronic pain: Secondary | ICD-10-CM | POA: Diagnosis not present

## 2023-06-19 DIAGNOSIS — I700 Atherosclerosis of aorta: Secondary | ICD-10-CM | POA: Diagnosis not present

## 2023-06-19 DIAGNOSIS — I69322 Dysarthria following cerebral infarction: Secondary | ICD-10-CM | POA: Diagnosis not present

## 2023-06-19 DIAGNOSIS — I69351 Hemiplegia and hemiparesis following cerebral infarction affecting right dominant side: Secondary | ICD-10-CM | POA: Diagnosis not present

## 2023-06-19 NOTE — Telephone Encounter (Signed)
Verbal order given to Bridgette with [facility] she will pass message along to [facility], Physical Therapist. Telephone Contact(local office): [phone]

## 2023-06-25 DIAGNOSIS — I680 Cerebral amyloid angiopathy: Secondary | ICD-10-CM | POA: Diagnosis not present

## 2023-06-25 DIAGNOSIS — G319 Degenerative disease of nervous system, unspecified: Secondary | ICD-10-CM | POA: Diagnosis not present

## 2023-06-25 DIAGNOSIS — I69322 Dysarthria following cerebral infarction: Secondary | ICD-10-CM | POA: Diagnosis not present

## 2023-06-25 DIAGNOSIS — E785 Hyperlipidemia, unspecified: Secondary | ICD-10-CM | POA: Diagnosis not present

## 2023-06-25 DIAGNOSIS — I69354 Hemiplegia and hemiparesis following cerebral infarction affecting left non-dominant side: Secondary | ICD-10-CM | POA: Diagnosis not present

## 2023-06-25 DIAGNOSIS — N390 Urinary tract infection, site not specified: Secondary | ICD-10-CM | POA: Diagnosis not present

## 2023-06-25 DIAGNOSIS — G4730 Sleep apnea, unspecified: Secondary | ICD-10-CM | POA: Diagnosis not present

## 2023-06-25 DIAGNOSIS — G8929 Other chronic pain: Secondary | ICD-10-CM | POA: Diagnosis not present

## 2023-06-25 DIAGNOSIS — M549 Dorsalgia, unspecified: Secondary | ICD-10-CM | POA: Diagnosis not present

## 2023-06-25 DIAGNOSIS — C679 Malignant neoplasm of bladder, unspecified: Secondary | ICD-10-CM | POA: Diagnosis not present

## 2023-06-25 DIAGNOSIS — N139 Obstructive and reflux uropathy, unspecified: Secondary | ICD-10-CM | POA: Diagnosis not present

## 2023-06-25 DIAGNOSIS — E854 Organ-limited amyloidosis: Secondary | ICD-10-CM | POA: Diagnosis not present

## 2023-06-25 DIAGNOSIS — I119 Hypertensive heart disease without heart failure: Secondary | ICD-10-CM | POA: Diagnosis not present

## 2023-06-25 DIAGNOSIS — D630 Anemia in neoplastic disease: Secondary | ICD-10-CM | POA: Diagnosis not present

## 2023-06-25 DIAGNOSIS — I700 Atherosclerosis of aorta: Secondary | ICD-10-CM | POA: Diagnosis not present

## 2023-06-25 DIAGNOSIS — I69351 Hemiplegia and hemiparesis following cerebral infarction affecting right dominant side: Secondary | ICD-10-CM | POA: Diagnosis not present

## 2023-06-27 ENCOUNTER — Ambulatory Visit: Payer: Medicare Other | Admitting: Family Medicine

## 2023-06-27 DIAGNOSIS — R404 Transient alteration of awareness: Secondary | ICD-10-CM | POA: Diagnosis not present

## 2023-06-27 DIAGNOSIS — Z79899 Other long term (current) drug therapy: Secondary | ICD-10-CM | POA: Diagnosis not present

## 2023-06-27 DIAGNOSIS — C679 Malignant neoplasm of bladder, unspecified: Secondary | ICD-10-CM | POA: Diagnosis not present

## 2023-06-27 DIAGNOSIS — C678 Malignant neoplasm of overlapping sites of bladder: Secondary | ICD-10-CM | POA: Diagnosis not present

## 2023-06-30 DIAGNOSIS — N139 Obstructive and reflux uropathy, unspecified: Secondary | ICD-10-CM | POA: Diagnosis not present

## 2023-06-30 DIAGNOSIS — D630 Anemia in neoplastic disease: Secondary | ICD-10-CM | POA: Diagnosis not present

## 2023-06-30 DIAGNOSIS — C679 Malignant neoplasm of bladder, unspecified: Secondary | ICD-10-CM | POA: Diagnosis not present

## 2023-06-30 DIAGNOSIS — I69354 Hemiplegia and hemiparesis following cerebral infarction affecting left non-dominant side: Secondary | ICD-10-CM | POA: Diagnosis not present

## 2023-06-30 DIAGNOSIS — G319 Degenerative disease of nervous system, unspecified: Secondary | ICD-10-CM | POA: Diagnosis not present

## 2023-06-30 DIAGNOSIS — I680 Cerebral amyloid angiopathy: Secondary | ICD-10-CM | POA: Diagnosis not present

## 2023-06-30 DIAGNOSIS — I119 Hypertensive heart disease without heart failure: Secondary | ICD-10-CM | POA: Diagnosis not present

## 2023-06-30 DIAGNOSIS — M549 Dorsalgia, unspecified: Secondary | ICD-10-CM | POA: Diagnosis not present

## 2023-06-30 DIAGNOSIS — I69351 Hemiplegia and hemiparesis following cerebral infarction affecting right dominant side: Secondary | ICD-10-CM | POA: Diagnosis not present

## 2023-06-30 DIAGNOSIS — E854 Organ-limited amyloidosis: Secondary | ICD-10-CM | POA: Diagnosis not present

## 2023-06-30 DIAGNOSIS — N390 Urinary tract infection, site not specified: Secondary | ICD-10-CM | POA: Diagnosis not present

## 2023-06-30 DIAGNOSIS — I69322 Dysarthria following cerebral infarction: Secondary | ICD-10-CM | POA: Diagnosis not present

## 2023-06-30 DIAGNOSIS — E785 Hyperlipidemia, unspecified: Secondary | ICD-10-CM | POA: Diagnosis not present

## 2023-06-30 DIAGNOSIS — G8929 Other chronic pain: Secondary | ICD-10-CM | POA: Diagnosis not present

## 2023-06-30 DIAGNOSIS — G4730 Sleep apnea, unspecified: Secondary | ICD-10-CM | POA: Diagnosis not present

## 2023-06-30 DIAGNOSIS — I700 Atherosclerosis of aorta: Secondary | ICD-10-CM | POA: Diagnosis not present

## 2023-07-01 ENCOUNTER — Ambulatory Visit (INDEPENDENT_AMBULATORY_CARE_PROVIDER_SITE_OTHER): Payer: Medicare Other | Admitting: Family Medicine

## 2023-07-01 ENCOUNTER — Encounter: Payer: Self-pay | Admitting: Family Medicine

## 2023-07-01 VITALS — BP 108/77 | HR 115 | Ht 66.0 in | Wt 153.0 lb

## 2023-07-01 DIAGNOSIS — R310 Gross hematuria: Secondary | ICD-10-CM

## 2023-07-01 DIAGNOSIS — C676 Malignant neoplasm of ureteric orifice: Secondary | ICD-10-CM | POA: Diagnosis not present

## 2023-07-01 LAB — POCT URINALYSIS DIP (CLINITEK)
Glucose, UA: NEGATIVE mg/dL
Nitrite, UA: NEGATIVE
POC PROTEIN,UA: >=300 — AB
Spec Grav, UA: 1.02 (ref 1.010–1.025)
Urobilinogen, UA: 2 U/dL — AB
pH, UA: 6 (ref 5.0–8.0)

## 2023-07-01 MED ORDER — CEFDINIR 300 MG PO CAPS: 300.0000 mg | ORAL_CAPSULE | Freq: Two times a day (BID) | ORAL | 0 refills | Status: DC

## 2023-07-01 NOTE — Assessment & Plan Note (Signed)
She has had recurrence of urothelial carcinoma.  Managed by oncology as well as urology.  Currently on Keytruda, stable at this time.

## 2023-07-01 NOTE — Progress Notes (Signed)
Jessica Ritter - 62 y.o. female MRN [id number]  Date of birth: Dec 07, 1960  Subjective Chief Complaint  Patient presents with   Urinary Tract Infection    HPI Jessica Ritter is a 62 y.o. female here today for follow-up visit.  Unfortunately she has had recurrence of her urothelial carcinoma.  She is seeing oncology with [facility] in [place].  This is the oncologist that initially took care of her when she was initially diagnosed with cancer.  She is currently being treated with Keytruda.  She is also on Decadron 1 mg.  She has had some increased urinary frequency recently as well as hematuria again.  She denies pain with urination at this time.  She has seen urology with [facility] and may need stent exchange.  She denies fever or chills.  Appetite is okay.  She has tried to increase her fluid intake.  ROS:  A comprehensive ROS was completed and negative except as noted per HPI  Allergies  Allergen Reactions   Anticoagulant Compound Other (See Comments)    Subdural Hematoma   Wasp Venom Protein Swelling   Bee Venom Swelling   Warfarin Other (See Comments)    Amyloid angiopathy.  Hih risk of fatal ICH on treatment dose anticoagulants.  Antiplatelet therapy and DVT prophylaxis is okay.   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   Gait disturbance, post-stroke 04/03/2018   History of lower GI bleeding 05/2018   History of malignant neoplasm metastatic to lung 2002   History of malignant neoplasm of brain 2003   secondary, renal   History of malignant neoplasm of renal pelvis 1998   History of multiple pulmonary nodules 2002   Hyperlipidemia    Hypertension    Seizures (HCC) 2016    tapered off of Keppra 06/2018   Sleep apnea    Subdural hematoma caused by concussion (HCC) 04/2014    Past Surgical History:  Procedure Laterality Date   BRAIN SURGERY     brain tumor surgery     COLONOSCOPY W/ POLYPECTOMY  05/2018   CYSTOSCOPY WITH RETROGRADE PYELOGRAM, URETEROSCOPY AND STENT PLACEMENT Left 02/12/2023   Procedure: CYSTOSCOPY WITH RETROGRADE PYELOGRAM, URETEROSCOPY AND STENT PLACEMENT;  Surgeon: Heloise Purpura, MD;  Location: [facility];  Service: Urology;  Laterality: Left;   CYSTOSCOPY/URETEROSCOPY/HOLMIUM LASER/STENT PLACEMENT Left 03/10/2023   Procedure: CYSTOSCOPY/LEFT URETEROSCOPY WITH BRUSH BIOPSY/LEFT URETERAL STENT PLACEMENT WITH BLADDER BIOPSIES;  Surgeon: Heloise Purpura, MD;  Location: [facility];  Service: Urology;  Laterality: Left;  60 MINUTES NEEDED FOR CASE   KIDNEY SURGERY     LEG SURGERY     LUNG LOBECTOMY Right 2002   RLL    Social History   Socioeconomic History   Marital status: Married    Spouse name: Not on file   Number of children: Not on file   Years of education: Not on file   Highest education level: Not on file  Occupational History   Not on file  Tobacco Use   Smoking status: Former    Current packs/day: 0.00    Types: Cigarettes    Quit date: 07/21/1997    Years since quitting: 25.9   Smokeless  tobacco: Never  Vaping Use   Vaping status: Never Used  Substance and Sexual Activity   Alcohol use: Yes    Alcohol/week: 4.0 standard drinks of alcohol    Types: 2 Glasses of wine, 2 Standard drinks or equivalent per week    Comment: social.   Drug use: No   Sexual activity: Not Currently    Partners: Male  Other Topics Concern   Not on file  Social History Narrative   Not on file   Social Determinants of Health   Financial Resource Strain: Low Risk  (04/11/2023)   Received [facility]   Overall Financial Resource Strain (CARDIA)    Difficulty of Paying Living Expenses: Not hard at all  Food Insecurity: No Food Insecurity  (04/11/2023)   Received from [facility]   Hunger Vital Sign    Worried About Running Out of Food in the Last Year: Never true    Ran Out of Food in the Last Year: Never true  Transportation Needs: No Transportation Needs (04/11/2023)   Received from [facility] - Transportation    Lack of Transportation (Medical): No    Lack of Transportation (Non-Medical): No  Physical Activity: Not on file  Stress: Not on file  Social Connections: Unknown (12/17/2022)   Received from [facility]   Social Network    Social Network: Not on file    Family History  Problem Relation Age of Onset   Lung cancer Mother     Health Maintenance  Topic Date Due   PAP SMEAR-Modifier  07/19/2023 (Originally 10/01/2022)   COVID-19 Vaccine (3 - Pfizer risk series) 07/19/2023 (Originally 02/28/2020)   Medicare Annual Wellness (AWV)  07/19/2023 (Originally 11-May-1961)   Zoster Vaccines- Shingrix (1 of 2) 07/19/2023 (Originally 09/30/1981)   INFLUENZA VACCINE  01/19/2024 (Originally 05/22/2023)   MAMMOGRAM  02/21/2024 (Originally 07/27/2021)   Hepatitis C Screening  02/21/2024 (Originally 09/30/1980)   Colonoscopy  06/30/2024 (Originally 06/19/2023)   DTaP/Tdap/Td (2 - Td or Tdap) 07/03/2027   HPV VACCINES  Aged Out   HIV Screening  Discontinued     ----------------------------------------------------------------------------------------------------------------------------------------------------------------------------------------------------------------- Physical Exam BP 108/77 (BP Location: Right Arm, Patient Position: Sitting, Cuff Size: Normal)   Pulse (!) 115   Ht 5\' 6"  (1.676 m)   Wt 153 lb (69.4 kg)   SpO2 96%   BMI 24.69 kg/m   Physical Exam Constitutional:      Appearance: Normal appearance.  HENT:     Head: Normocephalic and atraumatic.  Eyes:     General: No scleral icterus. Cardiovascular:     Rate and Rhythm: Normal rate and regular rhythm.  Pulmonary:      Effort: Pulmonary effort is normal.     Breath sounds: Normal breath sounds.  Musculoskeletal:     Cervical back: Neck supple.  Neurological:     Mental Status: She is alert.  Psychiatric:        Mood and Affect: Mood normal.        Behavior: Behavior normal.     ------------------------------------------------------------------------------------------------------------------------------------------------------------------------------------------------------------------- Assessment and Plan  Malignant tumor of ureteric orifice (HCC) She has had recurrence of urothelial carcinoma.  Managed by oncology as well as urology.  Currently on Keytruda, stable at this time.  Hematuria Increase hematuria with frequency recently.  Denies pain with urination.  Urinalysis with blood and leukocytes.  Urine sent for culture.  Will cover empirically with cefdinir.   Meds ordered this encounter  Medications   cefdinir (OMNICEF) 300  MG capsule    Sig: Take 1 capsule (300 mg total) by mouth 2 (two) times daily.    Dispense:  14 capsule    Refill:  0    No follow-ups on file.    This visit occurred during the SARS-CoV-2 public health emergency.  Safety protocols were in place, including screening questions prior to the visit, additional usage of staff PPE, and extensive cleaning of exam room while observing appropriate contact time as indicated for disinfecting solutions.

## 2023-07-01 NOTE — Patient Instructions (Signed)
Start cefdinir twice daily.  Let me know if having worsening symptoms.

## 2023-07-01 NOTE — Assessment & Plan Note (Signed)
Increase hematuria with frequency recently.  Denies pain with urination.  Urinalysis with blood and leukocytes.  Urine sent for culture.  Will cover empirically with cefdinir.

## 2023-07-03 LAB — URINE CULTURE

## 2023-07-04 NOTE — Progress Notes (Signed)
COVID Vaccine received:  []  No [x]  Yes Date of any COVID positive Test in last 90 days: No PCP - Elmarie Shiley DO Cardiologist - no  Chest x-ray - 05/07/23 Epic EKG -  05/07/23 Epic Stress Test -  ECHO -  Cardiac Cath -   Bowel Prep - [x]  No  []   Yes ______  Pacemaker / ICD device [x]  No []  Yes   Spinal Cord Stimulator:[x]  No []  Yes       History of Sleep Apnea? [x]  No []  Yes   CPAP used?- []  No []  Yes    Does the patient monitor blood sugar?          [x]  No []  Yes  []  N/A  Patient has: [x]  NO Hx DM   []  Pre-DM                 []  DM1  []   DM2 Does patient have a [employer] or Dexacom? []  No []  Yes   Fasting Blood Sugar Ranges-  Checks Blood Sugar _____ times a day  GLP1 agonist / usual dose - no GLP1 instructions:  SGLT-2 inhibitors / usual dose - no SGLT-2 instructions:   Blood Thinner / Instructions:no Aspirin Instructions:81 mg. Stop 3 days prior to surgery per husband per MD office  Comments:   Activity level: Patient is able  to climb a flight of stairs without difficulty; [x]  No CP  []  No SOB, but immobility__   Patient can perform ADLs without assistance.   Anesthesia review:   Patient denies shortness of breath, fever, cough and chest pain at PAT appointment.  Patient verbalized understanding and agreement to the Pre-Surgical Instructions that were given to them at this PAT appointment. Patient was also educated of the need to review these PAT instructions again prior to his/her surgery.I reviewed the appropriate phone numbers to call if they have any and questions or concerns.

## 2023-07-04 NOTE — Patient Instructions (Addendum)
SURGICAL WAITING ROOM VISITATION  Patients having surgery or a procedure may have no more than 2 support people in the waiting area - these visitors may rotate.    Children under the age of 65 must have an adult with them who is not the patient.  Due to an increase in RSV and influenza rates and associated hospitalizations, children ages 39 and under may not visit patients in [facility] hospitals.  If the patient needs to stay at the hospital during part of their recovery, the visitor guidelines for inpatient rooms apply. Pre-op nurse will coordinate an appropriate time for 1 support person to accompany patient in pre-op.  This support person may not rotate.    Please refer to the [facility] website for the visitor guidelines for Inpatients (after your surgery is over and you are in a regular room).       Your procedure is scheduled on: 07/17/23   Report to [facility] Main Entrance    Report to admitting at 5:15 AM   Call this number if you have problems the morning of surgery [phone]   Do not eat food  or drink liquids :After Midnight.       Oral Hygiene is also important to reduce your risk of infection.                                    Remember - BRUSH YOUR TEETH THE MORNING OF SURGERY WITH YOUR REGULAR TOOTHPASTE  DENTURES WILL BE REMOVED PRIOR TO SURGERY PLEASE DO NOT APPLY "Poly grip" OR ADHESIVES!!!   Stop all vitamins and herbal supplements 7 days before surgery.   Take these medicines the morning of surgery with A SIP OF WATER:Atorvastatin, Decadran, Omnicef   Bring CPAP mask and tubing day of surgery.                              You may not have any metal on your body including hair pins, jewelry, and body piercing             Do not wear make-up, lotions, powders, perfumes, or deodorant  Do not wear nail polish including gel and S&S, artificial/acrylic nails, or any other type of covering on natural nails including finger and toenails. If you  have artificial nails, gel coating, etc. that needs to be removed by a nail salon please have this removed prior to surgery or surgery may need to be canceled/ delayed if the surgeon/ anesthesia feels like they are unable to be safely monitored.   Do not shave  48 hours prior to surgery.    Do not bring valuables to the hospital. [place] IS NOT             RESPONSIBLE   FOR VALUABLES.   Contacts, glasses, dentures or bridgework may not be worn into surgery.  DO NOT BRING YOUR HOME MEDICATIONS TO THE HOSPITAL. PHARMACY WILL DISPENSE MEDICATIONS LISTED ON YOUR MEDICATION LIST TO YOU DURING YOUR ADMISSION IN THE HOSPITAL!    Patients discharged on the day of surgery will not be allowed to drive home.  Someone NEEDS to stay with you for the first 24 hours after anesthesia.   Special Instructions: Bring a copy of your healthcare power of attorney and living will documents the day of surgery if you haven't scanned them before.  Please read over the following fact sheets you were given: IF YOU HAVE QUESTIONS ABOUT YOUR PRE-OP INSTRUCTIONS PLEASE CALL [phone] Rosey [facility]   If you received a COVID test during your pre-op visit  it is requested that you wear a mask when out in public, stay away from anyone that may not be feeling well and notify your surgeon if you develop symptoms. If you test positive for Covid or have been in contact with anyone that has tested positive in the last 10 days please notify you surgeon.    [place] - Preparing for Surgery Before surgery, you can play an important role.  Because skin is not sterile, your skin needs to be as free of germs as possible.  You can reduce the number of germs on your skin by washing with CHG (chlorahexidine gluconate) soap before surgery.  CHG is an antiseptic cleaner which kills germs and bonds with the skin to continue killing germs even after washing. Please DO NOT use if you have an allergy to CHG or antibacterial soaps.   If your skin becomes reddened/irritated stop using the CHG and inform your nurse when you arrive at Short Stay. Do not shave (including legs and underarms) for at least 48 hours prior to the first CHG shower.  You may shave your face/neck.  Please follow these instructions carefully:  1.  Shower with CHG Soap the night before surgery and the  morning of surgery.  2.  If you choose to wash your hair, wash your hair first as usual with your normal  shampoo.  3.  After you shampoo, rinse your hair and body thoroughly to remove the shampoo.                             4.  Use CHG as you would any other liquid soap.  You can apply chg directly to the skin and wash.  Gently with a scrungie or clean washcloth.  5.  Apply the CHG Soap to your body ONLY FROM THE NECK DOWN.   Do   not use on face/ open                           Wound or open sores. Avoid contact with eyes, ears mouth and   genitals (private parts).                       Wash face,  Genitals (private parts) with your normal soap.             6.  Wash thoroughly, paying special attention to the area where your    surgery  will be performed.  7.  Thoroughly rinse your body with warm water from the neck down.  8.  DO NOT shower/wash with your normal soap after using and rinsing off the CHG Soap.                9.  Pat yourself dry with a clean towel.            10.  Wear clean pajamas.            11.  Place clean sheets on your bed the night of your first shower and do not  sleep with pets. Day of Surgery : Do not apply any lotions/deodorants the morning of surgery.  Please wear clean clothes to the hospital/surgery center.  FAILURE TO FOLLOW THESE INSTRUCTIONS MAY RESULT IN THE CANCELLATION OF YOUR SURGERY  PATIENT SIGNATURE_________________________________  NURSE SIGNATURE__________________________________  ________________________________________________________________________

## 2023-07-07 ENCOUNTER — Other Ambulatory Visit: Payer: Self-pay

## 2023-07-07 ENCOUNTER — Encounter (HOSPITAL_COMMUNITY)
Admission: RE | Admit: 2023-07-07 | Discharge: 2023-07-07 | Disposition: A | Discharge status: Home / Self Care | Payer: Medicare Other | Source: Ambulatory Visit | Attending: Urology | Admitting: Urology

## 2023-07-07 ENCOUNTER — Encounter (HOSPITAL_COMMUNITY): Payer: Self-pay

## 2023-07-07 VITALS — BP 121/77 | HR 94 | Temp 97.8°F | Resp 16 | Ht 65.0 in | Wt 155.0 lb

## 2023-07-07 DIAGNOSIS — I10 Essential (primary) hypertension: Secondary | ICD-10-CM | POA: Diagnosis not present

## 2023-07-07 DIAGNOSIS — Z01818 Encounter for other preprocedural examination: Secondary | ICD-10-CM | POA: Diagnosis not present

## 2023-07-07 DIAGNOSIS — N390 Urinary tract infection, site not specified: Secondary | ICD-10-CM | POA: Diagnosis not present

## 2023-07-07 DIAGNOSIS — N131 Hydronephrosis with ureteral stricture, not elsewhere classified: Secondary | ICD-10-CM | POA: Diagnosis not present

## 2023-07-09 DIAGNOSIS — I69351 Hemiplegia and hemiparesis following cerebral infarction affecting right dominant side: Secondary | ICD-10-CM | POA: Diagnosis not present

## 2023-07-09 DIAGNOSIS — N139 Obstructive and reflux uropathy, unspecified: Secondary | ICD-10-CM | POA: Diagnosis not present

## 2023-07-09 DIAGNOSIS — I680 Cerebral amyloid angiopathy: Secondary | ICD-10-CM | POA: Diagnosis not present

## 2023-07-09 DIAGNOSIS — M549 Dorsalgia, unspecified: Secondary | ICD-10-CM | POA: Diagnosis not present

## 2023-07-09 DIAGNOSIS — G319 Degenerative disease of nervous system, unspecified: Secondary | ICD-10-CM | POA: Diagnosis not present

## 2023-07-09 DIAGNOSIS — I69354 Hemiplegia and hemiparesis following cerebral infarction affecting left non-dominant side: Secondary | ICD-10-CM | POA: Diagnosis not present

## 2023-07-09 DIAGNOSIS — E785 Hyperlipidemia, unspecified: Secondary | ICD-10-CM | POA: Diagnosis not present

## 2023-07-09 DIAGNOSIS — C679 Malignant neoplasm of bladder, unspecified: Secondary | ICD-10-CM | POA: Diagnosis not present

## 2023-07-09 DIAGNOSIS — N390 Urinary tract infection, site not specified: Secondary | ICD-10-CM | POA: Diagnosis not present

## 2023-07-09 DIAGNOSIS — E854 Organ-limited amyloidosis: Secondary | ICD-10-CM | POA: Diagnosis not present

## 2023-07-09 DIAGNOSIS — I700 Atherosclerosis of aorta: Secondary | ICD-10-CM | POA: Diagnosis not present

## 2023-07-09 DIAGNOSIS — G8929 Other chronic pain: Secondary | ICD-10-CM | POA: Diagnosis not present

## 2023-07-09 DIAGNOSIS — I69322 Dysarthria following cerebral infarction: Secondary | ICD-10-CM | POA: Diagnosis not present

## 2023-07-09 DIAGNOSIS — I119 Hypertensive heart disease without heart failure: Secondary | ICD-10-CM | POA: Diagnosis not present

## 2023-07-09 DIAGNOSIS — G4730 Sleep apnea, unspecified: Secondary | ICD-10-CM | POA: Diagnosis not present

## 2023-07-09 DIAGNOSIS — D630 Anemia in neoplastic disease: Secondary | ICD-10-CM | POA: Diagnosis not present

## 2023-07-15 DIAGNOSIS — E785 Hyperlipidemia, unspecified: Secondary | ICD-10-CM | POA: Diagnosis not present

## 2023-07-15 DIAGNOSIS — N390 Urinary tract infection, site not specified: Secondary | ICD-10-CM | POA: Diagnosis not present

## 2023-07-15 DIAGNOSIS — D630 Anemia in neoplastic disease: Secondary | ICD-10-CM | POA: Diagnosis not present

## 2023-07-15 DIAGNOSIS — C679 Malignant neoplasm of bladder, unspecified: Secondary | ICD-10-CM | POA: Diagnosis not present

## 2023-07-15 DIAGNOSIS — I119 Hypertensive heart disease without heart failure: Secondary | ICD-10-CM | POA: Diagnosis not present

## 2023-07-15 DIAGNOSIS — N139 Obstructive and reflux uropathy, unspecified: Secondary | ICD-10-CM | POA: Diagnosis not present

## 2023-07-15 DIAGNOSIS — M549 Dorsalgia, unspecified: Secondary | ICD-10-CM | POA: Diagnosis not present

## 2023-07-15 DIAGNOSIS — E854 Organ-limited amyloidosis: Secondary | ICD-10-CM | POA: Diagnosis not present

## 2023-07-15 DIAGNOSIS — I69354 Hemiplegia and hemiparesis following cerebral infarction affecting left non-dominant side: Secondary | ICD-10-CM | POA: Diagnosis not present

## 2023-07-15 DIAGNOSIS — G4730 Sleep apnea, unspecified: Secondary | ICD-10-CM | POA: Diagnosis not present

## 2023-07-15 DIAGNOSIS — I680 Cerebral amyloid angiopathy: Secondary | ICD-10-CM | POA: Diagnosis not present

## 2023-07-15 DIAGNOSIS — G8929 Other chronic pain: Secondary | ICD-10-CM | POA: Diagnosis not present

## 2023-07-15 DIAGNOSIS — G319 Degenerative disease of nervous system, unspecified: Secondary | ICD-10-CM | POA: Diagnosis not present

## 2023-07-15 DIAGNOSIS — I69351 Hemiplegia and hemiparesis following cerebral infarction affecting right dominant side: Secondary | ICD-10-CM | POA: Diagnosis not present

## 2023-07-15 DIAGNOSIS — I700 Atherosclerosis of aorta: Secondary | ICD-10-CM | POA: Diagnosis not present

## 2023-07-15 DIAGNOSIS — I69322 Dysarthria following cerebral infarction: Secondary | ICD-10-CM | POA: Diagnosis not present

## 2023-07-16 NOTE — Anesthesia Preprocedure Evaluation (Signed)
Anesthesia Evaluation  Patient identified by MRN, date of birth, ID band Patient awake    Reviewed: Allergy & Precautions, NPO status , Patient's Chart, lab work & pertinent test results  History of Anesthesia Complications Negative for: history of anesthetic complications  Airway Mallampati: III  TM Distance: >3 FB Neck ROM: Full   Comment: Previous grade II view with MAC 3 Dental  (+) Dental Advisory Given   Pulmonary neg shortness of breath, neg sleep apnea, neg COPD, neg recent URI, former smoker Pulmonary nodules, s/p lobectomy   Pulmonary exam normal breath sounds clear to auscultation       Cardiovascular hypertension, (-) angina +CHF (grade I diastolic dysfunction)  (-) Past MI, (-) Cardiac Stents and (-) CABG (-) dysrhythmias  Rhythm:Regular Rate:Normal  HLD  TTE 04/04/2015: Interpretation Summary  A complete two-dimensional transthoracic echocardiogram was performed.  Saline contrast injection was performed. The study was technically  difficult. The aortic valve is trileaflet.  The left ventricle is grossly normal size.  The left ventricular ejection fraction is normal (60-65%).  Grade I mild diastolic dysfunction; abnormal relaxation pattern.  The right ventricular ejection fraction is normal.  The left ventricular wall motion is normal.  Injection of contrast documented no interatrial shunt .     Neuro/Psych Seizures - (2016), Well Controlled,  H/o SDH in 2015, h/o brain neoplasm CVA (2016, left hemiparesis, dysarthria, gait disturbance), Residual Symptoms    GI/Hepatic Neg liver ROS,GERD  ,,  Endo/Other  negative endocrine ROS    Renal/GU Renal disease (left ureteral obstruction)   Metastatic bladder cancer    Musculoskeletal   Abdominal   Peds  Hematology negative hematology ROS (+) Lab Results      Component                Value               Date                      WBC                       6.1                 05/07/2023                HGB                      10.6 (L)            05/07/2023                HCT                      34.8 (L)            05/07/2023                MCV                      90.9                05/07/2023                PLT                      234                 05/07/2023  Anesthesia Other Findings   Reproductive/Obstetrics                             Anesthesia Physical Anesthesia Plan  ASA: 3  Anesthesia Plan: General   Post-op Pain Management: Tylenol PO (pre-op)*   Induction: Intravenous  PONV Risk Score and Plan: 3 and Ondansetron, Dexamethasone and Treatment may vary due to age or medical condition  Airway Management Planned: LMA  Additional Equipment:   Intra-op Plan:   Post-operative Plan: Extubation in OR  Informed Consent: I have reviewed the patients History and Physical, chart, labs and discussed the procedure including the risks, benefits and alternatives for the proposed anesthesia with the patient or authorized representative who has indicated his/her understanding and acceptance.     Dental advisory given  Plan Discussed with: CRNA and Anesthesiologist  Anesthesia Plan Comments: (Risks of general anesthesia discussed including, but not limited to, sore throat, hoarse voice, chipped/damaged teeth, injury to vocal cords, nausea and vomiting, allergic reactions, lung infection, heart attack, stroke, and death. All questions answered. )        Anesthesia Quick Evaluation

## 2023-07-16 NOTE — H&P (Signed)
Office Visit Report     07/07/2023   --------------------------------------------------------------------------------   Jessica Ritter. Combest  MRN: [id number]  DOB: October 30, 1960, 62 year old Female  SSN:    PRIMARY CARE:  Mammie Lorenzo, DO  PRIMARY CARE FAX:  [phone]  REFERRING:  Azucena Kuba  PROVIDER:  Heloise Purpura, M.D.  TREATING:  Bartholomew Crews, NP  LOCATION:  [facility] [phone]     --------------------------------------------------------------------------------   CC/HPI: 1. Left ureteral obstruction  2. Metastatic urothelial carcinoma   Jessica Ritter follows up today after having started systemic immunotherapy under the care of her oncologist, Dr. Leodis Rains, in [place]. She did apparently have a urinary tract infection in July requiring treatment and has now completed 2 cycles of therapy. At this time, she feels well. She has had some intermittent hematuria consistent with her indwelling ureteral stent in her solitary kidney. The most recent serum creatinine that I have on file was from late July and was 0.86.   07/07/2023: 62 year old female who is here for preoperative appointment prior to left stent exchange. She denies shortness of breath, fevers and chills. She has been seeing blood in her urine but also has a chronic indwelling stent. Her primary care physician put her on an antibiotic, for concerns of UTI, which she is on at this time.     ALLERGIES: Bee stings Cipro Warfarin    MEDICATIONS: Aspirin 81 mg tablet,chewable 1 tablet PO Daily  Acetaminophen 500 mg tablet 1 tablet PO Daily PRN  Atorvastatin Calcium 80 mg tablet 1 tablet PO Daily  Calcium  Cefixime 400 mg capsule 1 capsule PO Daily  Dexamethasone 4 mg tablet 1 tablet PO Daily  STATIN  Valtrex 500 mg tablet 1 tablet PO Daily     GU PSH: No GU PSH    NON-GU PSH: Cardiac Stent Placement     GU PMH: Bladder Cancer overlapping sites - 06/06/2023, - 03/25/2023 Ureteral  obstruction - 06/06/2023, - 03/25/2023 Hematuria, Unspec History of bladder cancer Postmenopausal atrophic vaginitis      PMH Notes:   1) Urothelial carcinoma: She is s/p a right nephroureterectomy around 1998in [place], [place]. She had multiple asynchronous metastatic recurrences treated with local and systemic therapy and has been disease free since around 2004. Her systemic therapy was received in [place] at [facility].   She developed acute renal failure with a CT scan showing obstruction at the left UVJ of her solitary kidney. She was eventually diagnosed with recurrent and metastatic urothelial carcinoma and is under the care of Dr. Garnette Scheuermann in [place], [place] who has been her long time oncologist.   Apr 2024: Left ureteral stent (unable to access the ureter ureteroscopically)  May 2024: Left ureteroscopy - visibly normal with atypical brush biopsy, biopsy of extrinsic bladder mass adjacent to left ureteral orifice - high grade, muscle invasive urothelial carcinoma with > 60% giant cell histology   2) Left ureteral obstruction: She presented to me in April 2024 with left ureteral obstruction of her solitary left kidney causing acute renal failure.   Apr 2024: Left ureteral stent placement with resolution of AKI  May 2024: Left ureteral stent replacement   NON-GU PMH: GERD Hypercholesterolemia Hypertension Sleep Apnea Stroke/TIA    FAMILY HISTORY: No Family History    SOCIAL HISTORY: Marital Status: Married Preferred Language: English; Ethnicity: Not Hispanic Or Latino; Race: White Current Smoking Status: Patient does not smoke anymore.   Tobacco Use Assessment Completed: Used Tobacco in last  30 days? Has never drank.  Drinks 1 caffeinated drink per day.    REVIEW OF SYSTEMS:    GU Review Female:   Patient reports hard to postpone urination and leakage of urine. Patient denies frequent urination, burning /pain with urination, get up at night to urinate, stream starts and  stops, trouble starting your stream, have to strain to urinate, and being pregnant.  Gastrointestinal (Upper):   Patient denies nausea, vomiting, and indigestion/ heartburn.  Gastrointestinal (Lower):   Patient denies diarrhea and constipation.  Constitutional:   Patient denies fever, night sweats, weight loss, and fatigue.  Skin:   Patient denies skin rash/ lesion and itching.  Eyes:   Patient denies blurred vision and double vision.  Ears/ Nose/ Throat:   Patient denies sore throat and sinus problems.  Hematologic/Lymphatic:   Patient denies swollen glands and easy bruising.  Cardiovascular:   Patient denies leg swelling and chest pains.  Respiratory:   Patient denies cough and shortness of breath.  Endocrine:   Patient denies excessive thirst.  Musculoskeletal:   Patient denies back pain and joint pain.  Neurological:   Patient denies headaches and dizziness.  Psychologic:   Patient denies depression and anxiety.   Notes: blood in urine     VITAL SIGNS:      07/07/2023 10:02 AM  BP 118/84 mmHg  Pulse 84 /min  Temperature 96.9 F / 36.0 C   MULTI-SYSTEM PHYSICAL EXAMINATION:    Constitutional: Well-nourished. No physical deformities. Normally developed. Good grooming.   Respiratory: Normal breath sounds. No labored breathing, no use of accessory muscles.   Cardiovascular: Regular rate and rhythm. No murmur, no gallop. Normal temperature, normal extremity pulses, no swelling, no varicosities.   Lymphatic: No enlargement of neck, axillae, groin.  Skin: No paleness, no jaundice, no cyanosis. No lesion, no ulcer, no rash.  Neurologic / Psychiatric: Oriented to time, oriented to place, oriented to person. No depression, no anxiety, no agitation.  Gastrointestinal: No mass, no tenderness, no rigidity, non obese abdomen.  Musculoskeletal: In a wheelchair.     Complexity of Data:  Source Of History:  Patient  Records Review:   Previous Doctor Records, Previous Patient Records  Urine  Test Review:   Urinalysis   07/07/23  Urinalysis  Urine Appearance Slightly Cloudy   Urine Color Amber   Urine Glucose 1+ mg/dL  Urine Bilirubin Neg mg/dL  Urine Ketones Trace mg/dL  Urine Specific Gravity 1.030   Urine Blood 3+ ery/uL  Urine pH 5.5   Urine Protein 3+ mg/dL  Urine Urobilinogen 0.2 mg/dL  Urine Nitrites Neg   Urine Leukocyte Esterase 1+ leu/uL  Urine WBC/hpf 0 - 5/hpf   Urine RBC/hpf >60/hpf   Urine Epithelial Cells 0 - 5/hpf   Urine Bacteria Mod (26-50/hpf)   Urine Mucous Present   Urine Yeast NS (Not Seen)   Urine Trichomonas Not Present   Urine Cystals NS (Not Seen)   Urine Casts NS (Not Seen)   Urine Sperm Not Present    PROCEDURES:          Urinalysis w/Scope Dipstick Dipstick Cont'd Micro  Color: Amber Bilirubin: Neg mg/dL WBC/hpf: 0 - 5/hpf  Appearance: Slightly Cloudy Ketones: Trace mg/dL RBC/hpf: >09/WJX  Specific Gravity: 1.030 Blood: 3+ ery/uL Bacteria: Mod (26-50/hpf)  pH: 5.5 Protein: 3+ mg/dL Cystals: NS (Not Seen)  Glucose: 1+ mg/dL Urobilinogen: 0.2 mg/dL Casts: NS (Not Seen)    Nitrites: Neg Trichomonas: Not Present    Leukocyte Esterase: 1+ leu/uL Mucous: Present  Epithelial Cells: 0 - 5/hpf      Yeast: NS (Not Seen)      Sperm: Not Present    Notes: too bloody to spin    ASSESSMENT:      ICD-10 Details  1 GU:   Ureteral obstruction - N13.1 Left, Chronic, Stable   PLAN:           Orders Labs CULTURE, URINE          Document Letter(s):  Created for Patient: Clinical Summary         Notes:   Urinalysis will be sent for precautionary culture today. All questions were answered to the best of my ability. They understand to notify the office should she develop chest pain or shortness of breath or any other concerning symptoms including fevers, chills, stent dislodgment. Keep upcoming surgery as planned on 07/17/2023.        Next Appointment:      Next Appointment: 07/17/2023 07:30 AM    Appointment Type: Surgery      Location: [facility] [phone]    Provider: Heloise Purpura, M.D.    Reason for Visit: WL/OP CYSTO, (L) STENT      * Signed by Bartholomew Crews, NP on 07/07/23 at 4:56 PM (EDT)*

## 2023-07-17 ENCOUNTER — Encounter (HOSPITAL_COMMUNITY)
Admission: RE | Disposition: A | Discharge status: Home / Self Care | Payer: Self-pay | Source: Home / Self Care | Attending: Urology

## 2023-07-17 ENCOUNTER — Ambulatory Visit (HOSPITAL_BASED_OUTPATIENT_CLINIC_OR_DEPARTMENT_OTHER): Payer: Medicare Other | Admitting: Anesthesiology

## 2023-07-17 ENCOUNTER — Ambulatory Visit (HOSPITAL_COMMUNITY)
Admission: RE | Admit: 2023-07-17 | Discharge: 2023-07-17 | Disposition: A | Discharge status: Home / Self Care | Payer: Medicare Other | Attending: Urology | Admitting: Urology

## 2023-07-17 ENCOUNTER — Encounter (HOSPITAL_COMMUNITY): Payer: Self-pay | Admitting: Urology

## 2023-07-17 ENCOUNTER — Ambulatory Visit (HOSPITAL_COMMUNITY): Payer: Medicare Other

## 2023-07-17 ENCOUNTER — Ambulatory Visit (HOSPITAL_COMMUNITY): Payer: Medicare Other | Admitting: Anesthesiology

## 2023-07-17 DIAGNOSIS — E785 Hyperlipidemia, unspecified: Secondary | ICD-10-CM | POA: Diagnosis not present

## 2023-07-17 DIAGNOSIS — I509 Heart failure, unspecified: Secondary | ICD-10-CM | POA: Insufficient documentation

## 2023-07-17 DIAGNOSIS — I110 Hypertensive heart disease with heart failure: Secondary | ICD-10-CM | POA: Diagnosis not present

## 2023-07-17 DIAGNOSIS — N135 Crossing vessel and stricture of ureter without hydronephrosis: Secondary | ICD-10-CM | POA: Diagnosis not present

## 2023-07-17 DIAGNOSIS — Z87891 Personal history of nicotine dependence: Secondary | ICD-10-CM | POA: Insufficient documentation

## 2023-07-17 DIAGNOSIS — I69354 Hemiplegia and hemiparesis following cerebral infarction affecting left non-dominant side: Secondary | ICD-10-CM | POA: Diagnosis not present

## 2023-07-17 DIAGNOSIS — I10 Essential (primary) hypertension: Secondary | ICD-10-CM

## 2023-07-17 DIAGNOSIS — C799 Secondary malignant neoplasm of unspecified site: Secondary | ICD-10-CM | POA: Insufficient documentation

## 2023-07-17 DIAGNOSIS — R471 Dysarthria and anarthria: Secondary | ICD-10-CM | POA: Diagnosis not present

## 2023-07-17 DIAGNOSIS — R269 Unspecified abnormalities of gait and mobility: Secondary | ICD-10-CM | POA: Insufficient documentation

## 2023-07-17 DIAGNOSIS — C679 Malignant neoplasm of bladder, unspecified: Secondary | ICD-10-CM | POA: Diagnosis not present

## 2023-07-17 DIAGNOSIS — K219 Gastro-esophageal reflux disease without esophagitis: Secondary | ICD-10-CM | POA: Insufficient documentation

## 2023-07-17 DIAGNOSIS — Z905 Acquired absence of kidney: Secondary | ICD-10-CM | POA: Insufficient documentation

## 2023-07-17 DIAGNOSIS — C678 Malignant neoplasm of overlapping sites of bladder: Secondary | ICD-10-CM | POA: Diagnosis not present

## 2023-07-17 DIAGNOSIS — N131 Hydronephrosis with ureteral stricture, not elsewhere classified: Secondary | ICD-10-CM | POA: Diagnosis not present

## 2023-07-17 HISTORY — PX: CYSTOSCOPY WITH STENT PLACEMENT: SHX5790

## 2023-07-17 SURGERY — CYSTOSCOPY, WITH STENT INSERTION
Anesthesia: General | Laterality: Left

## 2023-07-17 MED ORDER — SODIUM CHLORIDE 0.9 % IV SOLN
2.0000 g | INTRAVENOUS | Status: AC
  Administered 2023-07-17: 2 g via INTRAVENOUS
  Filled 2023-07-17: qty 20

## 2023-07-17 MED ORDER — AMISULPRIDE (ANTIEMETIC) 5 MG/2ML IV SOLN: 10.0000 mg | Freq: Once | INTRAVENOUS | Status: DC | PRN

## 2023-07-17 MED ORDER — OXYCODONE HCL 5 MG/5ML PO SOLN: 5.0000 mg | Freq: Once | ORAL | Status: DC | PRN

## 2023-07-17 MED ORDER — FENTANYL CITRATE PF 50 MCG/ML IJ SOSY: 25.0000 ug | PREFILLED_SYRINGE | INTRAMUSCULAR | Status: DC | PRN

## 2023-07-17 MED ORDER — LIDOCAINE HCL (PF) 2 % IJ SOLN
INTRAMUSCULAR | Status: AC
  Filled 2023-07-17: qty 5

## 2023-07-17 MED ORDER — ONDANSETRON HCL 4 MG/2ML IJ SOLN
INTRAMUSCULAR | Status: AC
  Filled 2023-07-17: qty 2

## 2023-07-17 MED ORDER — ACETAMINOPHEN 500 MG PO TABS
1000.0000 mg | ORAL_TABLET | Freq: Once | ORAL | Status: AC
  Administered 2023-07-17: 1000 mg via ORAL
  Filled 2023-07-17: qty 2

## 2023-07-17 MED ORDER — OXYCODONE HCL 5 MG PO TABS: 5.0000 mg | ORAL_TABLET | Freq: Once | ORAL | Status: DC | PRN

## 2023-07-17 MED ORDER — STERILE WATER FOR IRRIGATION IR SOLN
Status: DC | PRN
  Administered 2023-07-17: 3000 mL

## 2023-07-17 MED ORDER — FENTANYL CITRATE (PF) 100 MCG/2ML IJ SOLN
INTRAMUSCULAR | Status: AC
  Filled 2023-07-17: qty 2

## 2023-07-17 MED ORDER — MIDAZOLAM HCL 2 MG/2ML IJ SOLN
INTRAMUSCULAR | Status: AC
  Filled 2023-07-17: qty 2

## 2023-07-17 MED ORDER — ONDANSETRON HCL 4 MG/2ML IJ SOLN
INTRAMUSCULAR | Status: DC | PRN
Start: 2023-07-17 — End: 2023-07-17
  Administered 2023-07-17: 4 mg via INTRAVENOUS

## 2023-07-17 MED ORDER — PROPOFOL 10 MG/ML IV BOLUS
INTRAVENOUS | Status: DC | PRN
  Administered 2023-07-17: 120 mg via INTRAVENOUS

## 2023-07-17 MED ORDER — LACTATED RINGERS IV SOLN: INTRAVENOUS | Status: DC

## 2023-07-17 MED ORDER — FENTANYL CITRATE (PF) 100 MCG/2ML IJ SOLN
INTRAMUSCULAR | Status: DC | PRN
  Administered 2023-07-17: 25 ug via INTRAVENOUS

## 2023-07-17 MED ORDER — DEXAMETHASONE SODIUM PHOSPHATE 10 MG/ML IJ SOLN
INTRAMUSCULAR | Status: DC | PRN
  Administered 2023-07-17: 5 mg via INTRAVENOUS

## 2023-07-17 MED ORDER — IOHEXOL 300 MG/ML  SOLN
INTRAMUSCULAR | Status: DC | PRN
  Administered 2023-07-17: 3 mL

## 2023-07-17 MED ORDER — LIDOCAINE 2% (20 MG/ML) 5 ML SYRINGE
INTRAMUSCULAR | Status: DC | PRN
  Administered 2023-07-17: 100 mg via INTRAVENOUS

## 2023-07-17 MED ORDER — CHLORHEXIDINE GLUCONATE 0.12 % MT SOLN
15.0000 mL | Freq: Once | OROMUCOSAL | Status: AC
  Administered 2023-07-17: 15 mL via OROMUCOSAL

## 2023-07-17 MED ORDER — DEXAMETHASONE SODIUM PHOSPHATE 10 MG/ML IJ SOLN
INTRAMUSCULAR | Status: AC
  Filled 2023-07-17: qty 1

## 2023-07-17 MED ORDER — ORAL CARE MOUTH RINSE: 15.0000 mL | Freq: Once | OROMUCOSAL | Status: AC

## 2023-07-17 MED ORDER — PROPOFOL 10 MG/ML IV BOLUS
INTRAVENOUS | Status: AC
  Filled 2023-07-17: qty 20

## 2023-07-17 SURGICAL SUPPLY — 14 items
BAG URO CATCHER STRL LF (MISCELLANEOUS) ×1 IMPLANT
CATH URETL OPEN END 6FR 70 (CATHETERS) IMPLANT
CLOTH BEACON ORANGE TIMEOUT ST (SAFETY) ×1 IMPLANT
GLOVE SURG LX STRL 7.5 STRW (GLOVE) ×1 IMPLANT
GOWN STRL REUS W/ TWL XL LVL3 (GOWN DISPOSABLE) ×1 IMPLANT
GOWN STRL REUS W/TWL XL LVL3 (GOWN DISPOSABLE) ×1
GUIDEWIRE STR DUAL SENSOR (WIRE) ×1 IMPLANT
GUIDEWIRE ZIPWRE .038 STRAIGHT (WIRE) IMPLANT
KIT TURNOVER KIT A (KITS) IMPLANT
MANIFOLD NEPTUNE II (INSTRUMENTS) ×1 IMPLANT
PACK CYSTO (CUSTOM PROCEDURE TRAY) ×1 IMPLANT
STENT URO INLAY 6FRX26CM (STENTS) IMPLANT
TUBING CONNECTING 10 (TUBING) ×1 IMPLANT
TUBING UROLOGY SET (TUBING) IMPLANT

## 2023-07-17 NOTE — Transfer of Care (Signed)
Immediate Anesthesia Transfer of Care Note  Patient: Jessica Ritter  Procedure(s) Performed: CYSTOSCOPY WITH LEFT URETEROSCOPY, RETROGRADE PYELOGRAM, URETERAL STENT CHANGE (Left)  Patient Location: PACU  Anesthesia Type:General  Level of Consciousness: awake, alert , oriented, and patient cooperative  Airway & Oxygen Therapy: Patient Spontanous Breathing and Patient connected to face mask oxygen  Post-op Assessment: Report given to RN, Post -op Vital signs reviewed and stable, and Patient moving all extremities  Post vital signs: Reviewed and stable  Last Vitals:  Vitals Value Taken Time  BP 135/85 07/17/23 0805  Temp    Pulse 65 07/17/23 0809  Resp 14 07/17/23 0809  SpO2 100 % 07/17/23 0809  Vitals shown include unfiled device data.  Last Pain:  Vitals:   07/17/23 0615  TempSrc: Oral  PainSc:          Complications: No notable events documented.

## 2023-07-17 NOTE — Anesthesia Procedure Notes (Signed)
Procedure Name: LMA Insertion Date/Time: 07/17/2023 7:25 AM  Performed by: Elisabeth Cara, CRNAPre-anesthesia Checklist: Patient identified, Emergency Drugs available, Suction available, Patient being monitored and Timeout performed Patient Re-evaluated:Patient Re-evaluated prior to induction Oxygen Delivery Method: Circle system utilized Preoxygenation: Pre-oxygenation with 100% oxygen Induction Type: IV induction LMA: LMA with gastric port inserted LMA Size: 4.0 Number of attempts: 1 Placement Confirmation: positive ETCO2 and breath sounds checked- equal and bilateral Tube secured with: Tape Dental Injury: Teeth and Oropharynx as per pre-operative assessment

## 2023-07-17 NOTE — Interval H&P Note (Signed)
History and Physical Interval Note:  07/17/2023 7:06 AM  Jessica Ritter  has presented today for surgery, with the diagnosis of LEFT URETERAL OBSTRUCTION.  The various methods of treatment have been discussed with the patient and family. After consideration of risks, benefits and other options for treatment, the patient has consented to  Procedure(s) with comments: CYSTOSCOPY WITH LEFT URETERAL STENT CHANGE (Left) - 45 MINUTES NEEDED FOR CASE as a surgical intervention.  The patient's history has been reviewed, patient examined, no change in status, stable for surgery.  I have reviewed the patient's chart and labs.  Questions were answered to the patient's satisfaction.     [employer]

## 2023-07-17 NOTE — Interval H&P Note (Signed)
History and Physical Interval Note:  07/17/2023 6:52 AM  Jessica Ritter  has presented today for surgery, with the diagnosis of LEFT URETERAL OBSTRUCTION.  The various methods of treatment have been discussed with the patient and family. After consideration of risks, benefits and other options for treatment, the patient has consented to  Procedure(s) with comments: CYSTOSCOPY WITH LEFT URETERAL STENT CHANGE (Left) - 45 MINUTES NEEDED FOR CASE as a surgical intervention.  The patient's history has been reviewed, patient examined, no change in status, stable for surgery.  I have reviewed the patient's chart and labs.  Questions were answered to the patient's satisfaction.     [employer]

## 2023-07-17 NOTE — Discharge Instructions (Addendum)

## 2023-07-17 NOTE — Op Note (Signed)
Preoperative diagnosis:  Left ureteral obstruction Solitary left kidney Urothelial carcinoma  Postoperative diagnosis:  Left ureteral obstruction Solitary left kidney Urothelial carcinoma  Procedure:  Cystoscopy Left ureteral stent placement (6 x 26 Bard inlay Optima-no string) Left retrograde pyelography with interpretation Left ureteroscopy  Surgeon: Moody Bruins. M.D.  Anesthesia: General  Complications: None  Intraoperative findings: Her stent was severely encrusted and would not allow the wire to pass through.  Left retrograde pyelography was performed with a 6 French ureteral catheter and Omnipaque contrast.  This revealed severe distal stenosis of the left ureter for approximately 2 cm with a severely dilated proximal ureter to that point.  EBL: Minimal  Specimens: None  Indication: Jessica Ritter is a 62 y.o. patient with left ureteral obstruction secondary to urothelial carcinoma. After reviewing the management options for treatment, he elected to proceed with the above surgical procedure(s). We have discussed the potential benefits and risks of the procedure, side effects of the proposed treatment, the likelihood of the patient achieving the goals of the procedure, and any potential problems that might occur during the procedure or recuperation. Informed consent has been obtained.  Description of procedure:  The patient was taken to the operating room and general anesthesia was induced.  The patient was placed in the dorsal lithotomy position, prepped and draped in the usual sterile fashion, and preoperative antibiotics were administered. A preoperative time-out was performed.   Cystourethroscopy was performed.  The patient's urethra was examined and was unremarkable. The bladder was then systematically examined in its entirety. There was no evidence for any bladder tumors, stones, or other mucosal pathology.  There was significant edema and a very encrusted left  ureteral stent.  Attention then turned to the left ureteral orifice and the patient's indwelling ureteral stent was identified and brought out to the urethral meatus with the flexible graspers.  Attempts to pass a wire through the indwelling stent were unsuccessful due to severe encrustation.  The stent was therefore removed.  Attempts to replace the wire into the left ureter were unsuccessful.  A 6 French ureteral catheter was then used to perform a retrograde pyelogram with severe stenosis of the distal left ureter.  Semirigid ureteroscopy was then performed which did allow the wire to be advanced under direct vision past the obstruction and up into the left renal pelvis under fluoroscopic guidance.  The semirigid ureteroscope was removed and the wire was backloaded on the cystoscope.  A 0.38 sensor guidewire was then advanced up the left ureter into the renal pelvis under fluoroscopic guidance.  The wire was then backloaded through the cystoscope and a ureteral stent was advance over the wire using Seldinger technique.  The stent was positioned appropriately under fluoroscopic and cystoscopic guidance.  The wire was then removed with an adequate stent curl noted in the renal pelvis as well as in the bladder.  The bladder was then emptied and the procedure ended.  The patient appeared to tolerate the procedure well and without complications.  The patient was able to be awakened and transferred to the recovery unit in satisfactory condition.    Moody Bruins MD

## 2023-07-17 NOTE — Anesthesia Postprocedure Evaluation (Signed)
Anesthesia Post Note  Patient: Jessica Ritter  Procedure(s) Performed: CYSTOSCOPY WITH LEFT URETEROSCOPY, RETROGRADE PYELOGRAM, URETERAL STENT CHANGE (Left)     Patient location during evaluation: PACU Anesthesia Type: General Level of consciousness: awake Pain management: pain level controlled Vital Signs Assessment: post-procedure vital signs reviewed and stable Respiratory status: spontaneous breathing, nonlabored ventilation and respiratory function stable Cardiovascular status: blood pressure returned to baseline and stable Postop Assessment: no apparent nausea or vomiting Anesthetic complications: no   No notable events documented.  Last Vitals:  Vitals:   07/17/23 0830 07/17/23 0838  BP: 129/79   Pulse: 72   Resp: 18   Temp:  36.8 C  SpO2: 96%     Last Pain:  Vitals:   07/17/23 0838  TempSrc:   PainSc: 0-No pain                 Linton Rump

## 2023-07-18 ENCOUNTER — Encounter (HOSPITAL_COMMUNITY): Payer: Self-pay | Admitting: Urology

## 2023-07-23 DIAGNOSIS — D630 Anemia in neoplastic disease: Secondary | ICD-10-CM | POA: Diagnosis not present

## 2023-07-23 DIAGNOSIS — E785 Hyperlipidemia, unspecified: Secondary | ICD-10-CM | POA: Diagnosis not present

## 2023-07-23 DIAGNOSIS — I119 Hypertensive heart disease without heart failure: Secondary | ICD-10-CM | POA: Diagnosis not present

## 2023-07-23 DIAGNOSIS — N139 Obstructive and reflux uropathy, unspecified: Secondary | ICD-10-CM | POA: Diagnosis not present

## 2023-07-23 DIAGNOSIS — M549 Dorsalgia, unspecified: Secondary | ICD-10-CM | POA: Diagnosis not present

## 2023-07-23 DIAGNOSIS — G8929 Other chronic pain: Secondary | ICD-10-CM | POA: Diagnosis not present

## 2023-07-23 DIAGNOSIS — C679 Malignant neoplasm of bladder, unspecified: Secondary | ICD-10-CM | POA: Diagnosis not present

## 2023-07-23 DIAGNOSIS — G4730 Sleep apnea, unspecified: Secondary | ICD-10-CM | POA: Diagnosis not present

## 2023-07-23 DIAGNOSIS — I69322 Dysarthria following cerebral infarction: Secondary | ICD-10-CM | POA: Diagnosis not present

## 2023-07-23 DIAGNOSIS — I700 Atherosclerosis of aorta: Secondary | ICD-10-CM | POA: Diagnosis not present

## 2023-07-23 DIAGNOSIS — I69354 Hemiplegia and hemiparesis following cerebral infarction affecting left non-dominant side: Secondary | ICD-10-CM | POA: Diagnosis not present

## 2023-07-23 DIAGNOSIS — I69351 Hemiplegia and hemiparesis following cerebral infarction affecting right dominant side: Secondary | ICD-10-CM | POA: Diagnosis not present

## 2023-07-23 DIAGNOSIS — N390 Urinary tract infection, site not specified: Secondary | ICD-10-CM | POA: Diagnosis not present

## 2023-07-23 DIAGNOSIS — E854 Organ-limited amyloidosis: Secondary | ICD-10-CM | POA: Diagnosis not present

## 2023-07-23 DIAGNOSIS — I680 Cerebral amyloid angiopathy: Secondary | ICD-10-CM | POA: Diagnosis not present

## 2023-07-23 DIAGNOSIS — G319 Degenerative disease of nervous system, unspecified: Secondary | ICD-10-CM | POA: Diagnosis not present

## 2023-07-25 DIAGNOSIS — I69354 Hemiplegia and hemiparesis following cerebral infarction affecting left non-dominant side: Secondary | ICD-10-CM | POA: Diagnosis not present

## 2023-07-25 DIAGNOSIS — E785 Hyperlipidemia, unspecified: Secondary | ICD-10-CM | POA: Diagnosis not present

## 2023-07-25 DIAGNOSIS — E854 Organ-limited amyloidosis: Secondary | ICD-10-CM | POA: Diagnosis not present

## 2023-07-25 DIAGNOSIS — G4730 Sleep apnea, unspecified: Secondary | ICD-10-CM | POA: Diagnosis not present

## 2023-07-25 DIAGNOSIS — I69322 Dysarthria following cerebral infarction: Secondary | ICD-10-CM | POA: Diagnosis not present

## 2023-07-25 DIAGNOSIS — I69351 Hemiplegia and hemiparesis following cerebral infarction affecting right dominant side: Secondary | ICD-10-CM | POA: Diagnosis not present

## 2023-07-25 DIAGNOSIS — G319 Degenerative disease of nervous system, unspecified: Secondary | ICD-10-CM | POA: Diagnosis not present

## 2023-07-25 DIAGNOSIS — G8929 Other chronic pain: Secondary | ICD-10-CM | POA: Diagnosis not present

## 2023-07-25 DIAGNOSIS — I119 Hypertensive heart disease without heart failure: Secondary | ICD-10-CM | POA: Diagnosis not present

## 2023-07-25 DIAGNOSIS — D630 Anemia in neoplastic disease: Secondary | ICD-10-CM | POA: Diagnosis not present

## 2023-07-25 DIAGNOSIS — M549 Dorsalgia, unspecified: Secondary | ICD-10-CM | POA: Diagnosis not present

## 2023-07-25 DIAGNOSIS — I680 Cerebral amyloid angiopathy: Secondary | ICD-10-CM | POA: Diagnosis not present

## 2023-07-25 DIAGNOSIS — I700 Atherosclerosis of aorta: Secondary | ICD-10-CM | POA: Diagnosis not present

## 2023-07-25 DIAGNOSIS — C679 Malignant neoplasm of bladder, unspecified: Secondary | ICD-10-CM | POA: Diagnosis not present

## 2023-07-25 DIAGNOSIS — N390 Urinary tract infection, site not specified: Secondary | ICD-10-CM | POA: Diagnosis not present

## 2023-07-25 DIAGNOSIS — N139 Obstructive and reflux uropathy, unspecified: Secondary | ICD-10-CM | POA: Diagnosis not present

## 2023-07-28 DIAGNOSIS — D62 Acute posthemorrhagic anemia: Secondary | ICD-10-CM | POA: Diagnosis not present

## 2023-07-28 DIAGNOSIS — C772 Secondary and unspecified malignant neoplasm of intra-abdominal lymph nodes: Secondary | ICD-10-CM | POA: Diagnosis not present

## 2023-07-28 DIAGNOSIS — C679 Malignant neoplasm of bladder, unspecified: Secondary | ICD-10-CM | POA: Diagnosis not present

## 2023-07-28 DIAGNOSIS — Z79899 Other long term (current) drug therapy: Secondary | ICD-10-CM | POA: Diagnosis not present

## 2023-07-29 DIAGNOSIS — I69322 Dysarthria following cerebral infarction: Secondary | ICD-10-CM | POA: Diagnosis not present

## 2023-07-29 DIAGNOSIS — I680 Cerebral amyloid angiopathy: Secondary | ICD-10-CM | POA: Diagnosis not present

## 2023-07-29 DIAGNOSIS — C679 Malignant neoplasm of bladder, unspecified: Secondary | ICD-10-CM | POA: Diagnosis not present

## 2023-07-29 DIAGNOSIS — G319 Degenerative disease of nervous system, unspecified: Secondary | ICD-10-CM | POA: Diagnosis not present

## 2023-07-29 DIAGNOSIS — G4730 Sleep apnea, unspecified: Secondary | ICD-10-CM | POA: Diagnosis not present

## 2023-07-29 DIAGNOSIS — E854 Organ-limited amyloidosis: Secondary | ICD-10-CM | POA: Diagnosis not present

## 2023-07-29 DIAGNOSIS — I69354 Hemiplegia and hemiparesis following cerebral infarction affecting left non-dominant side: Secondary | ICD-10-CM | POA: Diagnosis not present

## 2023-07-29 DIAGNOSIS — N139 Obstructive and reflux uropathy, unspecified: Secondary | ICD-10-CM | POA: Diagnosis not present

## 2023-07-29 DIAGNOSIS — E785 Hyperlipidemia, unspecified: Secondary | ICD-10-CM | POA: Diagnosis not present

## 2023-07-29 DIAGNOSIS — G8929 Other chronic pain: Secondary | ICD-10-CM | POA: Diagnosis not present

## 2023-07-29 DIAGNOSIS — I119 Hypertensive heart disease without heart failure: Secondary | ICD-10-CM | POA: Diagnosis not present

## 2023-07-29 DIAGNOSIS — I69351 Hemiplegia and hemiparesis following cerebral infarction affecting right dominant side: Secondary | ICD-10-CM | POA: Diagnosis not present

## 2023-07-29 DIAGNOSIS — N390 Urinary tract infection, site not specified: Secondary | ICD-10-CM | POA: Diagnosis not present

## 2023-07-29 DIAGNOSIS — I700 Atherosclerosis of aorta: Secondary | ICD-10-CM | POA: Diagnosis not present

## 2023-07-29 DIAGNOSIS — D630 Anemia in neoplastic disease: Secondary | ICD-10-CM | POA: Diagnosis not present

## 2023-07-29 DIAGNOSIS — M549 Dorsalgia, unspecified: Secondary | ICD-10-CM | POA: Diagnosis not present

## 2023-08-04 ENCOUNTER — Other Ambulatory Visit: Payer: Self-pay | Admitting: Family Medicine

## 2023-08-04 DIAGNOSIS — G9389 Other specified disorders of brain: Secondary | ICD-10-CM | POA: Diagnosis not present

## 2023-08-04 DIAGNOSIS — C679 Malignant neoplasm of bladder, unspecified: Secondary | ICD-10-CM | POA: Diagnosis not present

## 2023-08-04 DIAGNOSIS — G319 Degenerative disease of nervous system, unspecified: Secondary | ICD-10-CM | POA: Diagnosis not present

## 2023-08-04 DIAGNOSIS — G259 Extrapyramidal and movement disorder, unspecified: Secondary | ICD-10-CM | POA: Diagnosis not present

## 2023-08-04 DIAGNOSIS — E782 Mixed hyperlipidemia: Secondary | ICD-10-CM

## 2023-08-05 DIAGNOSIS — I69322 Dysarthria following cerebral infarction: Secondary | ICD-10-CM | POA: Diagnosis not present

## 2023-08-05 DIAGNOSIS — I680 Cerebral amyloid angiopathy: Secondary | ICD-10-CM | POA: Diagnosis not present

## 2023-08-05 DIAGNOSIS — D630 Anemia in neoplastic disease: Secondary | ICD-10-CM | POA: Diagnosis not present

## 2023-08-05 DIAGNOSIS — G8929 Other chronic pain: Secondary | ICD-10-CM | POA: Diagnosis not present

## 2023-08-05 DIAGNOSIS — G319 Degenerative disease of nervous system, unspecified: Secondary | ICD-10-CM | POA: Diagnosis not present

## 2023-08-05 DIAGNOSIS — G4730 Sleep apnea, unspecified: Secondary | ICD-10-CM | POA: Diagnosis not present

## 2023-08-05 DIAGNOSIS — I69354 Hemiplegia and hemiparesis following cerebral infarction affecting left non-dominant side: Secondary | ICD-10-CM | POA: Diagnosis not present

## 2023-08-05 DIAGNOSIS — E785 Hyperlipidemia, unspecified: Secondary | ICD-10-CM | POA: Diagnosis not present

## 2023-08-05 DIAGNOSIS — M549 Dorsalgia, unspecified: Secondary | ICD-10-CM | POA: Diagnosis not present

## 2023-08-05 DIAGNOSIS — E854 Organ-limited amyloidosis: Secondary | ICD-10-CM | POA: Diagnosis not present

## 2023-08-05 DIAGNOSIS — I700 Atherosclerosis of aorta: Secondary | ICD-10-CM | POA: Diagnosis not present

## 2023-08-05 DIAGNOSIS — I69351 Hemiplegia and hemiparesis following cerebral infarction affecting right dominant side: Secondary | ICD-10-CM | POA: Diagnosis not present

## 2023-08-05 DIAGNOSIS — C679 Malignant neoplasm of bladder, unspecified: Secondary | ICD-10-CM | POA: Diagnosis not present

## 2023-08-05 DIAGNOSIS — I119 Hypertensive heart disease without heart failure: Secondary | ICD-10-CM | POA: Diagnosis not present

## 2023-08-05 DIAGNOSIS — N139 Obstructive and reflux uropathy, unspecified: Secondary | ICD-10-CM | POA: Diagnosis not present

## 2023-08-05 DIAGNOSIS — N390 Urinary tract infection, site not specified: Secondary | ICD-10-CM | POA: Diagnosis not present

## 2023-08-07 DIAGNOSIS — G319 Degenerative disease of nervous system, unspecified: Secondary | ICD-10-CM | POA: Diagnosis not present

## 2023-08-07 DIAGNOSIS — G4730 Sleep apnea, unspecified: Secondary | ICD-10-CM | POA: Diagnosis not present

## 2023-08-07 DIAGNOSIS — I700 Atherosclerosis of aorta: Secondary | ICD-10-CM | POA: Diagnosis not present

## 2023-08-07 DIAGNOSIS — G8929 Other chronic pain: Secondary | ICD-10-CM | POA: Diagnosis not present

## 2023-08-07 DIAGNOSIS — M549 Dorsalgia, unspecified: Secondary | ICD-10-CM | POA: Diagnosis not present

## 2023-08-07 DIAGNOSIS — E785 Hyperlipidemia, unspecified: Secondary | ICD-10-CM | POA: Diagnosis not present

## 2023-08-07 DIAGNOSIS — I69351 Hemiplegia and hemiparesis following cerebral infarction affecting right dominant side: Secondary | ICD-10-CM | POA: Diagnosis not present

## 2023-08-07 DIAGNOSIS — I680 Cerebral amyloid angiopathy: Secondary | ICD-10-CM | POA: Diagnosis not present

## 2023-08-07 DIAGNOSIS — D630 Anemia in neoplastic disease: Secondary | ICD-10-CM | POA: Diagnosis not present

## 2023-08-07 DIAGNOSIS — N390 Urinary tract infection, site not specified: Secondary | ICD-10-CM | POA: Diagnosis not present

## 2023-08-07 DIAGNOSIS — I69322 Dysarthria following cerebral infarction: Secondary | ICD-10-CM | POA: Diagnosis not present

## 2023-08-07 DIAGNOSIS — E854 Organ-limited amyloidosis: Secondary | ICD-10-CM | POA: Diagnosis not present

## 2023-08-07 DIAGNOSIS — I69354 Hemiplegia and hemiparesis following cerebral infarction affecting left non-dominant side: Secondary | ICD-10-CM | POA: Diagnosis not present

## 2023-08-07 DIAGNOSIS — N139 Obstructive and reflux uropathy, unspecified: Secondary | ICD-10-CM | POA: Diagnosis not present

## 2023-08-07 DIAGNOSIS — C679 Malignant neoplasm of bladder, unspecified: Secondary | ICD-10-CM | POA: Diagnosis not present

## 2023-08-07 DIAGNOSIS — I119 Hypertensive heart disease without heart failure: Secondary | ICD-10-CM | POA: Diagnosis not present

## 2023-08-08 DIAGNOSIS — C679 Malignant neoplasm of bladder, unspecified: Secondary | ICD-10-CM | POA: Diagnosis not present

## 2023-08-08 DIAGNOSIS — I69351 Hemiplegia and hemiparesis following cerebral infarction affecting right dominant side: Secondary | ICD-10-CM | POA: Diagnosis not present

## 2023-08-08 DIAGNOSIS — E785 Hyperlipidemia, unspecified: Secondary | ICD-10-CM | POA: Diagnosis not present

## 2023-08-08 DIAGNOSIS — I119 Hypertensive heart disease without heart failure: Secondary | ICD-10-CM | POA: Diagnosis not present

## 2023-08-08 DIAGNOSIS — I700 Atherosclerosis of aorta: Secondary | ICD-10-CM | POA: Diagnosis not present

## 2023-08-08 DIAGNOSIS — E854 Organ-limited amyloidosis: Secondary | ICD-10-CM | POA: Diagnosis not present

## 2023-08-08 DIAGNOSIS — I69354 Hemiplegia and hemiparesis following cerebral infarction affecting left non-dominant side: Secondary | ICD-10-CM | POA: Diagnosis not present

## 2023-08-08 DIAGNOSIS — I69322 Dysarthria following cerebral infarction: Secondary | ICD-10-CM | POA: Diagnosis not present

## 2023-08-08 DIAGNOSIS — N390 Urinary tract infection, site not specified: Secondary | ICD-10-CM | POA: Diagnosis not present

## 2023-08-08 DIAGNOSIS — M549 Dorsalgia, unspecified: Secondary | ICD-10-CM | POA: Diagnosis not present

## 2023-08-08 DIAGNOSIS — G4730 Sleep apnea, unspecified: Secondary | ICD-10-CM | POA: Diagnosis not present

## 2023-08-08 DIAGNOSIS — G8929 Other chronic pain: Secondary | ICD-10-CM | POA: Diagnosis not present

## 2023-08-08 DIAGNOSIS — N139 Obstructive and reflux uropathy, unspecified: Secondary | ICD-10-CM | POA: Diagnosis not present

## 2023-08-08 DIAGNOSIS — I680 Cerebral amyloid angiopathy: Secondary | ICD-10-CM | POA: Diagnosis not present

## 2023-08-08 DIAGNOSIS — G319 Degenerative disease of nervous system, unspecified: Secondary | ICD-10-CM | POA: Diagnosis not present

## 2023-08-08 DIAGNOSIS — D630 Anemia in neoplastic disease: Secondary | ICD-10-CM | POA: Diagnosis not present

## 2023-08-11 ENCOUNTER — Other Ambulatory Visit: Payer: Self-pay

## 2023-08-11 DIAGNOSIS — E782 Mixed hyperlipidemia: Secondary | ICD-10-CM

## 2023-08-11 MED ORDER — ATORVASTATIN CALCIUM 80 MG PO TABS: ORAL_TABLET | ORAL | 3 refills | Status: DC

## 2023-08-12 DIAGNOSIS — M549 Dorsalgia, unspecified: Secondary | ICD-10-CM | POA: Diagnosis not present

## 2023-08-12 DIAGNOSIS — G8929 Other chronic pain: Secondary | ICD-10-CM | POA: Diagnosis not present

## 2023-08-12 DIAGNOSIS — I69351 Hemiplegia and hemiparesis following cerebral infarction affecting right dominant side: Secondary | ICD-10-CM | POA: Diagnosis not present

## 2023-08-12 DIAGNOSIS — E785 Hyperlipidemia, unspecified: Secondary | ICD-10-CM | POA: Diagnosis not present

## 2023-08-12 DIAGNOSIS — E854 Organ-limited amyloidosis: Secondary | ICD-10-CM | POA: Diagnosis not present

## 2023-08-12 DIAGNOSIS — N139 Obstructive and reflux uropathy, unspecified: Secondary | ICD-10-CM | POA: Diagnosis not present

## 2023-08-12 DIAGNOSIS — I680 Cerebral amyloid angiopathy: Secondary | ICD-10-CM | POA: Diagnosis not present

## 2023-08-12 DIAGNOSIS — I700 Atherosclerosis of aorta: Secondary | ICD-10-CM | POA: Diagnosis not present

## 2023-08-12 DIAGNOSIS — G319 Degenerative disease of nervous system, unspecified: Secondary | ICD-10-CM | POA: Diagnosis not present

## 2023-08-12 DIAGNOSIS — I69354 Hemiplegia and hemiparesis following cerebral infarction affecting left non-dominant side: Secondary | ICD-10-CM | POA: Diagnosis not present

## 2023-08-12 DIAGNOSIS — D630 Anemia in neoplastic disease: Secondary | ICD-10-CM | POA: Diagnosis not present

## 2023-08-12 DIAGNOSIS — G4730 Sleep apnea, unspecified: Secondary | ICD-10-CM | POA: Diagnosis not present

## 2023-08-12 DIAGNOSIS — I119 Hypertensive heart disease without heart failure: Secondary | ICD-10-CM | POA: Diagnosis not present

## 2023-08-12 DIAGNOSIS — N390 Urinary tract infection, site not specified: Secondary | ICD-10-CM | POA: Diagnosis not present

## 2023-08-12 DIAGNOSIS — C679 Malignant neoplasm of bladder, unspecified: Secondary | ICD-10-CM | POA: Diagnosis not present

## 2023-08-12 DIAGNOSIS — I69322 Dysarthria following cerebral infarction: Secondary | ICD-10-CM | POA: Diagnosis not present

## 2023-08-13 ENCOUNTER — Telehealth: Payer: Self-pay

## 2023-08-13 NOTE — Telephone Encounter (Signed)
PT informed

## 2023-08-13 NOTE — Telephone Encounter (Signed)
Patient informed. 

## 2023-08-13 NOTE — Telephone Encounter (Signed)
Glynis - PT with [facility]  [phone] Requesting verbal orders  States that  home health certification will end at the end of this week  Requesting continuation of care 1 x week for 3 weeks   For care giver instructions (new caregivers) And safety with mobility

## 2023-08-15 DIAGNOSIS — I69351 Hemiplegia and hemiparesis following cerebral infarction affecting right dominant side: Secondary | ICD-10-CM | POA: Diagnosis not present

## 2023-08-15 DIAGNOSIS — D630 Anemia in neoplastic disease: Secondary | ICD-10-CM | POA: Diagnosis not present

## 2023-08-15 DIAGNOSIS — G4730 Sleep apnea, unspecified: Secondary | ICD-10-CM | POA: Diagnosis not present

## 2023-08-15 DIAGNOSIS — C679 Malignant neoplasm of bladder, unspecified: Secondary | ICD-10-CM | POA: Diagnosis not present

## 2023-08-15 DIAGNOSIS — M549 Dorsalgia, unspecified: Secondary | ICD-10-CM | POA: Diagnosis not present

## 2023-08-15 DIAGNOSIS — N139 Obstructive and reflux uropathy, unspecified: Secondary | ICD-10-CM | POA: Diagnosis not present

## 2023-08-15 DIAGNOSIS — N390 Urinary tract infection, site not specified: Secondary | ICD-10-CM | POA: Diagnosis not present

## 2023-08-15 DIAGNOSIS — I119 Hypertensive heart disease without heart failure: Secondary | ICD-10-CM | POA: Diagnosis not present

## 2023-08-15 DIAGNOSIS — E854 Organ-limited amyloidosis: Secondary | ICD-10-CM | POA: Diagnosis not present

## 2023-08-15 DIAGNOSIS — I700 Atherosclerosis of aorta: Secondary | ICD-10-CM | POA: Diagnosis not present

## 2023-08-15 DIAGNOSIS — I69354 Hemiplegia and hemiparesis following cerebral infarction affecting left non-dominant side: Secondary | ICD-10-CM | POA: Diagnosis not present

## 2023-08-15 DIAGNOSIS — G319 Degenerative disease of nervous system, unspecified: Secondary | ICD-10-CM | POA: Diagnosis not present

## 2023-08-15 DIAGNOSIS — I680 Cerebral amyloid angiopathy: Secondary | ICD-10-CM | POA: Diagnosis not present

## 2023-08-15 DIAGNOSIS — G8929 Other chronic pain: Secondary | ICD-10-CM | POA: Diagnosis not present

## 2023-08-15 DIAGNOSIS — E785 Hyperlipidemia, unspecified: Secondary | ICD-10-CM | POA: Diagnosis not present

## 2023-08-15 DIAGNOSIS — I69322 Dysarthria following cerebral infarction: Secondary | ICD-10-CM | POA: Diagnosis not present

## 2023-08-19 DIAGNOSIS — L89322 Pressure ulcer of left buttock, stage 2: Secondary | ICD-10-CM | POA: Diagnosis not present

## 2023-08-19 DIAGNOSIS — D630 Anemia in neoplastic disease: Secondary | ICD-10-CM | POA: Diagnosis not present

## 2023-08-19 DIAGNOSIS — I69322 Dysarthria following cerebral infarction: Secondary | ICD-10-CM | POA: Diagnosis not present

## 2023-08-19 DIAGNOSIS — C679 Malignant neoplasm of bladder, unspecified: Secondary | ICD-10-CM | POA: Diagnosis not present

## 2023-08-19 DIAGNOSIS — N139 Obstructive and reflux uropathy, unspecified: Secondary | ICD-10-CM | POA: Diagnosis not present

## 2023-08-19 DIAGNOSIS — I119 Hypertensive heart disease without heart failure: Secondary | ICD-10-CM | POA: Diagnosis not present

## 2023-08-19 DIAGNOSIS — I700 Atherosclerosis of aorta: Secondary | ICD-10-CM | POA: Diagnosis not present

## 2023-08-19 DIAGNOSIS — G8929 Other chronic pain: Secondary | ICD-10-CM | POA: Diagnosis not present

## 2023-08-19 DIAGNOSIS — M549 Dorsalgia, unspecified: Secondary | ICD-10-CM | POA: Diagnosis not present

## 2023-08-19 DIAGNOSIS — G4730 Sleep apnea, unspecified: Secondary | ICD-10-CM | POA: Diagnosis not present

## 2023-08-19 DIAGNOSIS — E785 Hyperlipidemia, unspecified: Secondary | ICD-10-CM | POA: Diagnosis not present

## 2023-08-19 DIAGNOSIS — E854 Organ-limited amyloidosis: Secondary | ICD-10-CM | POA: Diagnosis not present

## 2023-08-19 DIAGNOSIS — I680 Cerebral amyloid angiopathy: Secondary | ICD-10-CM | POA: Diagnosis not present

## 2023-08-19 DIAGNOSIS — I69351 Hemiplegia and hemiparesis following cerebral infarction affecting right dominant side: Secondary | ICD-10-CM | POA: Diagnosis not present

## 2023-08-19 DIAGNOSIS — G319 Degenerative disease of nervous system, unspecified: Secondary | ICD-10-CM | POA: Diagnosis not present

## 2023-08-19 DIAGNOSIS — I69354 Hemiplegia and hemiparesis following cerebral infarction affecting left non-dominant side: Secondary | ICD-10-CM | POA: Diagnosis not present

## 2023-08-20 DIAGNOSIS — M549 Dorsalgia, unspecified: Secondary | ICD-10-CM | POA: Diagnosis not present

## 2023-08-20 DIAGNOSIS — I69351 Hemiplegia and hemiparesis following cerebral infarction affecting right dominant side: Secondary | ICD-10-CM | POA: Diagnosis not present

## 2023-08-20 DIAGNOSIS — N139 Obstructive and reflux uropathy, unspecified: Secondary | ICD-10-CM | POA: Diagnosis not present

## 2023-08-20 DIAGNOSIS — L89322 Pressure ulcer of left buttock, stage 2: Secondary | ICD-10-CM | POA: Diagnosis not present

## 2023-08-20 DIAGNOSIS — I700 Atherosclerosis of aorta: Secondary | ICD-10-CM | POA: Diagnosis not present

## 2023-08-20 DIAGNOSIS — I119 Hypertensive heart disease without heart failure: Secondary | ICD-10-CM | POA: Diagnosis not present

## 2023-08-20 DIAGNOSIS — E785 Hyperlipidemia, unspecified: Secondary | ICD-10-CM | POA: Diagnosis not present

## 2023-08-20 DIAGNOSIS — D630 Anemia in neoplastic disease: Secondary | ICD-10-CM | POA: Diagnosis not present

## 2023-08-20 DIAGNOSIS — C679 Malignant neoplasm of bladder, unspecified: Secondary | ICD-10-CM | POA: Diagnosis not present

## 2023-08-20 DIAGNOSIS — I680 Cerebral amyloid angiopathy: Secondary | ICD-10-CM | POA: Diagnosis not present

## 2023-08-20 DIAGNOSIS — G8929 Other chronic pain: Secondary | ICD-10-CM | POA: Diagnosis not present

## 2023-08-20 DIAGNOSIS — G4730 Sleep apnea, unspecified: Secondary | ICD-10-CM | POA: Diagnosis not present

## 2023-08-20 DIAGNOSIS — E854 Organ-limited amyloidosis: Secondary | ICD-10-CM | POA: Diagnosis not present

## 2023-08-20 DIAGNOSIS — I69322 Dysarthria following cerebral infarction: Secondary | ICD-10-CM | POA: Diagnosis not present

## 2023-08-20 DIAGNOSIS — I69354 Hemiplegia and hemiparesis following cerebral infarction affecting left non-dominant side: Secondary | ICD-10-CM | POA: Diagnosis not present

## 2023-08-20 DIAGNOSIS — G319 Degenerative disease of nervous system, unspecified: Secondary | ICD-10-CM | POA: Diagnosis not present

## 2023-08-21 ENCOUNTER — Telehealth: Payer: Self-pay | Admitting: Family Medicine

## 2023-08-21 DIAGNOSIS — C676 Malignant neoplasm of ureteric orifice: Secondary | ICD-10-CM

## 2023-08-21 DIAGNOSIS — C651 Malignant neoplasm of right renal pelvis: Secondary | ICD-10-CM

## 2023-08-21 DIAGNOSIS — S065XAA Traumatic subdural hemorrhage with loss of consciousness status unknown, initial encounter: Secondary | ICD-10-CM

## 2023-08-21 NOTE — Telephone Encounter (Signed)
Referral, clinical notes, demographics and copies of insurance cards have been faxed to [facility] at [phone]. Office will contact patient to schedule referral appointment.   [facility] added to [employer]

## 2023-08-21 NOTE — Telephone Encounter (Signed)
Referral entered to "[facility]"    CM

## 2023-08-21 NOTE — Telephone Encounter (Signed)
Patty called from [facility] they're requesting a Pallative Care Referral for patient they're needing face sheet, last 2 offices notes, demographics they can be faxed to [phone]  Direct phone number is [phone]

## 2023-08-25 DIAGNOSIS — R310 Gross hematuria: Secondary | ICD-10-CM | POA: Diagnosis not present

## 2023-08-25 DIAGNOSIS — Z79899 Other long term (current) drug therapy: Secondary | ICD-10-CM | POA: Diagnosis not present

## 2023-08-25 DIAGNOSIS — R319 Hematuria, unspecified: Secondary | ICD-10-CM | POA: Diagnosis not present

## 2023-08-25 DIAGNOSIS — Z905 Acquired absence of kidney: Secondary | ICD-10-CM | POA: Diagnosis not present

## 2023-08-25 DIAGNOSIS — C661 Malignant neoplasm of right ureter: Secondary | ICD-10-CM | POA: Diagnosis not present

## 2023-08-25 DIAGNOSIS — R1310 Dysphagia, unspecified: Secondary | ICD-10-CM | POA: Diagnosis not present

## 2023-08-25 DIAGNOSIS — C689 Malignant neoplasm of urinary organ, unspecified: Secondary | ICD-10-CM | POA: Diagnosis not present

## 2023-08-25 DIAGNOSIS — N1330 Unspecified hydronephrosis: Secondary | ICD-10-CM | POA: Diagnosis not present

## 2023-08-25 DIAGNOSIS — G893 Neoplasm related pain (acute) (chronic): Secondary | ICD-10-CM | POA: Diagnosis not present

## 2023-08-26 ENCOUNTER — Telehealth: Payer: Self-pay | Admitting: Family Medicine

## 2023-08-26 DIAGNOSIS — D630 Anemia in neoplastic disease: Secondary | ICD-10-CM | POA: Diagnosis not present

## 2023-08-26 DIAGNOSIS — I69322 Dysarthria following cerebral infarction: Secondary | ICD-10-CM | POA: Diagnosis not present

## 2023-08-26 DIAGNOSIS — I119 Hypertensive heart disease without heart failure: Secondary | ICD-10-CM | POA: Diagnosis not present

## 2023-08-26 DIAGNOSIS — G8929 Other chronic pain: Secondary | ICD-10-CM | POA: Diagnosis not present

## 2023-08-26 DIAGNOSIS — I700 Atherosclerosis of aorta: Secondary | ICD-10-CM | POA: Diagnosis not present

## 2023-08-26 DIAGNOSIS — I69354 Hemiplegia and hemiparesis following cerebral infarction affecting left non-dominant side: Secondary | ICD-10-CM | POA: Diagnosis not present

## 2023-08-26 DIAGNOSIS — G319 Degenerative disease of nervous system, unspecified: Secondary | ICD-10-CM | POA: Diagnosis not present

## 2023-08-26 DIAGNOSIS — G4730 Sleep apnea, unspecified: Secondary | ICD-10-CM | POA: Diagnosis not present

## 2023-08-26 DIAGNOSIS — E854 Organ-limited amyloidosis: Secondary | ICD-10-CM | POA: Diagnosis not present

## 2023-08-26 DIAGNOSIS — E785 Hyperlipidemia, unspecified: Secondary | ICD-10-CM | POA: Diagnosis not present

## 2023-08-26 DIAGNOSIS — M549 Dorsalgia, unspecified: Secondary | ICD-10-CM | POA: Diagnosis not present

## 2023-08-26 DIAGNOSIS — I69351 Hemiplegia and hemiparesis following cerebral infarction affecting right dominant side: Secondary | ICD-10-CM | POA: Diagnosis not present

## 2023-08-26 DIAGNOSIS — I680 Cerebral amyloid angiopathy: Secondary | ICD-10-CM | POA: Diagnosis not present

## 2023-08-26 DIAGNOSIS — C679 Malignant neoplasm of bladder, unspecified: Secondary | ICD-10-CM | POA: Diagnosis not present

## 2023-08-26 DIAGNOSIS — N139 Obstructive and reflux uropathy, unspecified: Secondary | ICD-10-CM | POA: Diagnosis not present

## 2023-08-26 DIAGNOSIS — L89322 Pressure ulcer of left buttock, stage 2: Secondary | ICD-10-CM | POA: Diagnosis not present

## 2023-08-26 NOTE — Telephone Encounter (Signed)
Jessica Ritter from [facility] called in wanting a verbal order for early discharge

## 2023-08-26 NOTE — Telephone Encounter (Signed)
error 

## 2023-08-27 ENCOUNTER — Telehealth: Payer: Self-pay | Admitting: Family Medicine

## 2023-08-27 DIAGNOSIS — G4730 Sleep apnea, unspecified: Secondary | ICD-10-CM | POA: Diagnosis not present

## 2023-08-27 DIAGNOSIS — I680 Cerebral amyloid angiopathy: Secondary | ICD-10-CM | POA: Diagnosis not present

## 2023-08-27 DIAGNOSIS — I69354 Hemiplegia and hemiparesis following cerebral infarction affecting left non-dominant side: Secondary | ICD-10-CM | POA: Diagnosis not present

## 2023-08-27 DIAGNOSIS — C679 Malignant neoplasm of bladder, unspecified: Secondary | ICD-10-CM | POA: Diagnosis not present

## 2023-08-27 DIAGNOSIS — G319 Degenerative disease of nervous system, unspecified: Secondary | ICD-10-CM | POA: Diagnosis not present

## 2023-08-27 DIAGNOSIS — I69351 Hemiplegia and hemiparesis following cerebral infarction affecting right dominant side: Secondary | ICD-10-CM | POA: Diagnosis not present

## 2023-08-27 DIAGNOSIS — I119 Hypertensive heart disease without heart failure: Secondary | ICD-10-CM | POA: Diagnosis not present

## 2023-08-27 DIAGNOSIS — I700 Atherosclerosis of aorta: Secondary | ICD-10-CM | POA: Diagnosis not present

## 2023-08-27 DIAGNOSIS — E854 Organ-limited amyloidosis: Secondary | ICD-10-CM | POA: Diagnosis not present

## 2023-08-27 DIAGNOSIS — N139 Obstructive and reflux uropathy, unspecified: Secondary | ICD-10-CM | POA: Diagnosis not present

## 2023-08-27 DIAGNOSIS — L89322 Pressure ulcer of left buttock, stage 2: Secondary | ICD-10-CM | POA: Diagnosis not present

## 2023-08-27 DIAGNOSIS — I69322 Dysarthria following cerebral infarction: Secondary | ICD-10-CM | POA: Diagnosis not present

## 2023-08-27 DIAGNOSIS — E785 Hyperlipidemia, unspecified: Secondary | ICD-10-CM | POA: Diagnosis not present

## 2023-08-27 DIAGNOSIS — D630 Anemia in neoplastic disease: Secondary | ICD-10-CM | POA: Diagnosis not present

## 2023-08-27 DIAGNOSIS — G8929 Other chronic pain: Secondary | ICD-10-CM | POA: Diagnosis not present

## 2023-08-27 DIAGNOSIS — M549 Dorsalgia, unspecified: Secondary | ICD-10-CM | POA: Diagnosis not present

## 2023-08-27 NOTE — Telephone Encounter (Signed)
error 

## 2023-08-27 NOTE — Telephone Encounter (Signed)
Gave verbal order

## 2023-09-01 ENCOUNTER — Encounter: Payer: Self-pay | Admitting: Family Medicine

## 2023-09-01 DIAGNOSIS — G8929 Other chronic pain: Secondary | ICD-10-CM | POA: Diagnosis not present

## 2023-09-01 DIAGNOSIS — C679 Malignant neoplasm of bladder, unspecified: Secondary | ICD-10-CM | POA: Diagnosis not present

## 2023-09-01 DIAGNOSIS — I119 Hypertensive heart disease without heart failure: Secondary | ICD-10-CM | POA: Diagnosis not present

## 2023-09-01 DIAGNOSIS — I700 Atherosclerosis of aorta: Secondary | ICD-10-CM | POA: Diagnosis not present

## 2023-09-01 DIAGNOSIS — G4730 Sleep apnea, unspecified: Secondary | ICD-10-CM | POA: Diagnosis not present

## 2023-09-01 DIAGNOSIS — E854 Organ-limited amyloidosis: Secondary | ICD-10-CM | POA: Diagnosis not present

## 2023-09-01 DIAGNOSIS — I680 Cerebral amyloid angiopathy: Secondary | ICD-10-CM | POA: Diagnosis not present

## 2023-09-01 DIAGNOSIS — L89322 Pressure ulcer of left buttock, stage 2: Secondary | ICD-10-CM | POA: Diagnosis not present

## 2023-09-01 DIAGNOSIS — D630 Anemia in neoplastic disease: Secondary | ICD-10-CM | POA: Diagnosis not present

## 2023-09-01 DIAGNOSIS — I69322 Dysarthria following cerebral infarction: Secondary | ICD-10-CM | POA: Diagnosis not present

## 2023-09-01 DIAGNOSIS — E785 Hyperlipidemia, unspecified: Secondary | ICD-10-CM | POA: Diagnosis not present

## 2023-09-01 DIAGNOSIS — G319 Degenerative disease of nervous system, unspecified: Secondary | ICD-10-CM | POA: Diagnosis not present

## 2023-09-01 DIAGNOSIS — M549 Dorsalgia, unspecified: Secondary | ICD-10-CM | POA: Diagnosis not present

## 2023-09-01 DIAGNOSIS — I69354 Hemiplegia and hemiparesis following cerebral infarction affecting left non-dominant side: Secondary | ICD-10-CM | POA: Diagnosis not present

## 2023-09-01 DIAGNOSIS — I69351 Hemiplegia and hemiparesis following cerebral infarction affecting right dominant side: Secondary | ICD-10-CM | POA: Diagnosis not present

## 2023-09-01 DIAGNOSIS — N139 Obstructive and reflux uropathy, unspecified: Secondary | ICD-10-CM | POA: Diagnosis not present

## 2023-09-02 DIAGNOSIS — L89322 Pressure ulcer of left buttock, stage 2: Secondary | ICD-10-CM | POA: Diagnosis not present

## 2023-09-02 DIAGNOSIS — M549 Dorsalgia, unspecified: Secondary | ICD-10-CM | POA: Diagnosis not present

## 2023-09-02 DIAGNOSIS — I700 Atherosclerosis of aorta: Secondary | ICD-10-CM | POA: Diagnosis not present

## 2023-09-02 DIAGNOSIS — D630 Anemia in neoplastic disease: Secondary | ICD-10-CM | POA: Diagnosis not present

## 2023-09-02 DIAGNOSIS — E785 Hyperlipidemia, unspecified: Secondary | ICD-10-CM | POA: Diagnosis not present

## 2023-09-02 DIAGNOSIS — G319 Degenerative disease of nervous system, unspecified: Secondary | ICD-10-CM | POA: Diagnosis not present

## 2023-09-02 DIAGNOSIS — I69354 Hemiplegia and hemiparesis following cerebral infarction affecting left non-dominant side: Secondary | ICD-10-CM | POA: Diagnosis not present

## 2023-09-02 DIAGNOSIS — C679 Malignant neoplasm of bladder, unspecified: Secondary | ICD-10-CM | POA: Diagnosis not present

## 2023-09-02 DIAGNOSIS — E854 Organ-limited amyloidosis: Secondary | ICD-10-CM | POA: Diagnosis not present

## 2023-09-02 DIAGNOSIS — G4730 Sleep apnea, unspecified: Secondary | ICD-10-CM | POA: Diagnosis not present

## 2023-09-02 DIAGNOSIS — G8929 Other chronic pain: Secondary | ICD-10-CM | POA: Diagnosis not present

## 2023-09-02 DIAGNOSIS — I69322 Dysarthria following cerebral infarction: Secondary | ICD-10-CM | POA: Diagnosis not present

## 2023-09-02 DIAGNOSIS — I680 Cerebral amyloid angiopathy: Secondary | ICD-10-CM | POA: Diagnosis not present

## 2023-09-02 DIAGNOSIS — I119 Hypertensive heart disease without heart failure: Secondary | ICD-10-CM | POA: Diagnosis not present

## 2023-09-02 DIAGNOSIS — N139 Obstructive and reflux uropathy, unspecified: Secondary | ICD-10-CM | POA: Diagnosis not present

## 2023-09-02 DIAGNOSIS — I69351 Hemiplegia and hemiparesis following cerebral infarction affecting right dominant side: Secondary | ICD-10-CM | POA: Diagnosis not present

## 2023-09-12 DIAGNOSIS — R82998 Other abnormal findings in urine: Secondary | ICD-10-CM | POA: Diagnosis not present

## 2023-09-12 DIAGNOSIS — N1330 Unspecified hydronephrosis: Secondary | ICD-10-CM | POA: Diagnosis not present

## 2023-09-12 DIAGNOSIS — Z79899 Other long term (current) drug therapy: Secondary | ICD-10-CM | POA: Diagnosis not present

## 2023-09-12 DIAGNOSIS — C661 Malignant neoplasm of right ureter: Secondary | ICD-10-CM | POA: Diagnosis not present

## 2023-09-29 DIAGNOSIS — R1310 Dysphagia, unspecified: Secondary | ICD-10-CM | POA: Diagnosis not present

## 2023-09-29 DIAGNOSIS — R319 Hematuria, unspecified: Secondary | ICD-10-CM | POA: Diagnosis not present

## 2023-09-29 DIAGNOSIS — G893 Neoplasm related pain (acute) (chronic): Secondary | ICD-10-CM | POA: Diagnosis not present

## 2023-09-29 DIAGNOSIS — C689 Malignant neoplasm of urinary organ, unspecified: Secondary | ICD-10-CM | POA: Diagnosis not present

## 2023-10-01 DIAGNOSIS — C651 Malignant neoplasm of right renal pelvis: Secondary | ICD-10-CM | POA: Diagnosis not present

## 2023-10-01 DIAGNOSIS — R8290 Unspecified abnormal findings in urine: Secondary | ICD-10-CM | POA: Diagnosis not present

## 2023-10-01 DIAGNOSIS — S065X9D Traumatic subdural hemorrhage with loss of consciousness of unspecified duration, subsequent encounter: Secondary | ICD-10-CM | POA: Diagnosis not present

## 2023-10-01 DIAGNOSIS — N1330 Unspecified hydronephrosis: Secondary | ICD-10-CM | POA: Diagnosis not present

## 2023-10-01 DIAGNOSIS — C676 Malignant neoplasm of ureteric orifice: Secondary | ICD-10-CM | POA: Diagnosis not present

## 2023-10-01 DIAGNOSIS — Z1330 Encounter for screening examination for mental health and behavioral disorders, unspecified: Secondary | ICD-10-CM | POA: Diagnosis not present

## 2023-10-01 DIAGNOSIS — E782 Mixed hyperlipidemia: Secondary | ICD-10-CM | POA: Diagnosis not present

## 2023-10-01 DIAGNOSIS — Z01812 Encounter for preprocedural laboratory examination: Secondary | ICD-10-CM | POA: Diagnosis not present

## 2023-10-01 DIAGNOSIS — H6123 Impacted cerumen, bilateral: Secondary | ICD-10-CM | POA: Diagnosis not present

## 2023-10-03 DIAGNOSIS — A000 Cholera due to Vibrio cholerae 01, biovar cholerae: Secondary | ICD-10-CM | POA: Diagnosis not present

## 2023-10-03 DIAGNOSIS — E782 Mixed hyperlipidemia: Secondary | ICD-10-CM | POA: Diagnosis not present

## 2023-10-03 DIAGNOSIS — R32 Unspecified urinary incontinence: Secondary | ICD-10-CM | POA: Diagnosis not present

## 2023-10-03 DIAGNOSIS — C651 Malignant neoplasm of right renal pelvis: Secondary | ICD-10-CM | POA: Diagnosis not present

## 2023-10-03 DIAGNOSIS — R159 Full incontinence of feces: Secondary | ICD-10-CM | POA: Diagnosis not present

## 2023-10-03 DIAGNOSIS — Z993 Dependence on wheelchair: Secondary | ICD-10-CM | POA: Diagnosis not present

## 2023-10-03 DIAGNOSIS — R531 Weakness: Secondary | ICD-10-CM | POA: Diagnosis not present

## 2023-10-03 DIAGNOSIS — L89309 Pressure ulcer of unspecified buttock, unspecified stage: Secondary | ICD-10-CM | POA: Diagnosis not present

## 2023-10-03 DIAGNOSIS — L89312 Pressure ulcer of right buttock, stage 2: Secondary | ICD-10-CM | POA: Diagnosis not present

## 2023-10-03 DIAGNOSIS — R2689 Other abnormalities of gait and mobility: Secondary | ICD-10-CM | POA: Diagnosis not present

## 2023-10-03 DIAGNOSIS — S065X9S Traumatic subdural hemorrhage with loss of consciousness of unspecified duration, sequela: Secondary | ICD-10-CM | POA: Diagnosis not present

## 2023-10-03 DIAGNOSIS — L89322 Pressure ulcer of left buttock, stage 2: Secondary | ICD-10-CM | POA: Diagnosis not present

## 2023-10-03 DIAGNOSIS — X58XXXS Exposure to other specified factors, sequela: Secondary | ICD-10-CM | POA: Diagnosis not present

## 2023-10-03 DIAGNOSIS — R4701 Aphasia: Secondary | ICD-10-CM | POA: Diagnosis not present

## 2023-10-07 DIAGNOSIS — X58XXXS Exposure to other specified factors, sequela: Secondary | ICD-10-CM | POA: Diagnosis not present

## 2023-10-07 DIAGNOSIS — R531 Weakness: Secondary | ICD-10-CM | POA: Diagnosis not present

## 2023-10-07 DIAGNOSIS — R2689 Other abnormalities of gait and mobility: Secondary | ICD-10-CM | POA: Diagnosis not present

## 2023-10-07 DIAGNOSIS — R4701 Aphasia: Secondary | ICD-10-CM | POA: Diagnosis not present

## 2023-10-07 DIAGNOSIS — S065X9S Traumatic subdural hemorrhage with loss of consciousness of unspecified duration, sequela: Secondary | ICD-10-CM | POA: Diagnosis not present

## 2023-10-07 DIAGNOSIS — R159 Full incontinence of feces: Secondary | ICD-10-CM | POA: Diagnosis not present

## 2023-10-07 DIAGNOSIS — L89322 Pressure ulcer of left buttock, stage 2: Secondary | ICD-10-CM | POA: Diagnosis not present

## 2023-10-07 DIAGNOSIS — R32 Unspecified urinary incontinence: Secondary | ICD-10-CM | POA: Diagnosis not present

## 2023-10-07 DIAGNOSIS — L89312 Pressure ulcer of right buttock, stage 2: Secondary | ICD-10-CM | POA: Diagnosis not present

## 2023-10-07 DIAGNOSIS — C651 Malignant neoplasm of right renal pelvis: Secondary | ICD-10-CM | POA: Diagnosis not present

## 2023-10-07 DIAGNOSIS — Z993 Dependence on wheelchair: Secondary | ICD-10-CM | POA: Diagnosis not present

## 2023-10-07 DIAGNOSIS — E782 Mixed hyperlipidemia: Secondary | ICD-10-CM | POA: Diagnosis not present

## 2023-10-08 DIAGNOSIS — R159 Full incontinence of feces: Secondary | ICD-10-CM | POA: Diagnosis not present

## 2023-10-08 DIAGNOSIS — C651 Malignant neoplasm of right renal pelvis: Secondary | ICD-10-CM | POA: Diagnosis not present

## 2023-10-08 DIAGNOSIS — R4701 Aphasia: Secondary | ICD-10-CM | POA: Diagnosis not present

## 2023-10-08 DIAGNOSIS — L89322 Pressure ulcer of left buttock, stage 2: Secondary | ICD-10-CM | POA: Diagnosis not present

## 2023-10-08 DIAGNOSIS — R2689 Other abnormalities of gait and mobility: Secondary | ICD-10-CM | POA: Diagnosis not present

## 2023-10-08 DIAGNOSIS — R32 Unspecified urinary incontinence: Secondary | ICD-10-CM | POA: Diagnosis not present

## 2023-10-08 DIAGNOSIS — E782 Mixed hyperlipidemia: Secondary | ICD-10-CM | POA: Diagnosis not present

## 2023-10-08 DIAGNOSIS — L89312 Pressure ulcer of right buttock, stage 2: Secondary | ICD-10-CM | POA: Diagnosis not present

## 2023-10-08 DIAGNOSIS — S065X9S Traumatic subdural hemorrhage with loss of consciousness of unspecified duration, sequela: Secondary | ICD-10-CM | POA: Diagnosis not present

## 2023-10-08 DIAGNOSIS — X58XXXS Exposure to other specified factors, sequela: Secondary | ICD-10-CM | POA: Diagnosis not present

## 2023-10-08 DIAGNOSIS — R531 Weakness: Secondary | ICD-10-CM | POA: Diagnosis not present

## 2023-10-08 DIAGNOSIS — Z993 Dependence on wheelchair: Secondary | ICD-10-CM | POA: Diagnosis not present

## 2023-10-10 DIAGNOSIS — E782 Mixed hyperlipidemia: Secondary | ICD-10-CM | POA: Diagnosis not present

## 2023-10-10 DIAGNOSIS — R159 Full incontinence of feces: Secondary | ICD-10-CM | POA: Diagnosis not present

## 2023-10-10 DIAGNOSIS — S065X9S Traumatic subdural hemorrhage with loss of consciousness of unspecified duration, sequela: Secondary | ICD-10-CM | POA: Diagnosis not present

## 2023-10-10 DIAGNOSIS — R2689 Other abnormalities of gait and mobility: Secondary | ICD-10-CM | POA: Diagnosis not present

## 2023-10-10 DIAGNOSIS — X58XXXS Exposure to other specified factors, sequela: Secondary | ICD-10-CM | POA: Diagnosis not present

## 2023-10-10 DIAGNOSIS — Z993 Dependence on wheelchair: Secondary | ICD-10-CM | POA: Diagnosis not present

## 2023-10-10 DIAGNOSIS — R590 Localized enlarged lymph nodes: Secondary | ICD-10-CM | POA: Diagnosis not present

## 2023-10-10 DIAGNOSIS — N134 Hydroureter: Secondary | ICD-10-CM | POA: Diagnosis not present

## 2023-10-10 DIAGNOSIS — C651 Malignant neoplasm of right renal pelvis: Secondary | ICD-10-CM | POA: Diagnosis not present

## 2023-10-10 DIAGNOSIS — C689 Malignant neoplasm of urinary organ, unspecified: Secondary | ICD-10-CM | POA: Diagnosis not present

## 2023-10-10 DIAGNOSIS — L89322 Pressure ulcer of left buttock, stage 2: Secondary | ICD-10-CM | POA: Diagnosis not present

## 2023-10-10 DIAGNOSIS — R531 Weakness: Secondary | ICD-10-CM | POA: Diagnosis not present

## 2023-10-10 DIAGNOSIS — R4701 Aphasia: Secondary | ICD-10-CM | POA: Diagnosis not present

## 2023-10-10 DIAGNOSIS — L89312 Pressure ulcer of right buttock, stage 2: Secondary | ICD-10-CM | POA: Diagnosis not present

## 2023-10-10 DIAGNOSIS — R32 Unspecified urinary incontinence: Secondary | ICD-10-CM | POA: Diagnosis not present

## 2023-10-13 DIAGNOSIS — Z87891 Personal history of nicotine dependence: Secondary | ICD-10-CM | POA: Diagnosis not present

## 2023-10-13 DIAGNOSIS — N132 Hydronephrosis with renal and ureteral calculous obstruction: Secondary | ICD-10-CM | POA: Diagnosis not present

## 2023-10-13 DIAGNOSIS — I639 Cerebral infarction, unspecified: Secondary | ICD-10-CM | POA: Diagnosis not present

## 2023-10-13 DIAGNOSIS — I10 Essential (primary) hypertension: Secondary | ICD-10-CM | POA: Diagnosis not present

## 2023-10-13 DIAGNOSIS — N135 Crossing vessel and stricture of ureter without hydronephrosis: Secondary | ICD-10-CM | POA: Diagnosis not present

## 2023-10-13 DIAGNOSIS — E785 Hyperlipidemia, unspecified: Secondary | ICD-10-CM | POA: Diagnosis not present

## 2023-10-13 DIAGNOSIS — N1330 Unspecified hydronephrosis: Secondary | ICD-10-CM | POA: Diagnosis not present

## 2023-10-13 DIAGNOSIS — K219 Gastro-esophageal reflux disease without esophagitis: Secondary | ICD-10-CM | POA: Diagnosis not present

## 2023-10-13 DIAGNOSIS — Z79899 Other long term (current) drug therapy: Secondary | ICD-10-CM | POA: Diagnosis not present

## 2023-10-13 DIAGNOSIS — Z7982 Long term (current) use of aspirin: Secondary | ICD-10-CM | POA: Diagnosis not present

## 2023-10-13 DIAGNOSIS — I69354 Hemiplegia and hemiparesis following cerebral infarction affecting left non-dominant side: Secondary | ICD-10-CM | POA: Diagnosis not present

## 2023-10-13 DIAGNOSIS — G4733 Obstructive sleep apnea (adult) (pediatric): Secondary | ICD-10-CM | POA: Diagnosis not present

## 2023-10-14 DIAGNOSIS — R159 Full incontinence of feces: Secondary | ICD-10-CM | POA: Diagnosis not present

## 2023-10-14 DIAGNOSIS — L89312 Pressure ulcer of right buttock, stage 2: Secondary | ICD-10-CM | POA: Diagnosis not present

## 2023-10-14 DIAGNOSIS — R32 Unspecified urinary incontinence: Secondary | ICD-10-CM | POA: Diagnosis not present

## 2023-10-14 DIAGNOSIS — C651 Malignant neoplasm of right renal pelvis: Secondary | ICD-10-CM | POA: Diagnosis not present

## 2023-10-14 DIAGNOSIS — R2689 Other abnormalities of gait and mobility: Secondary | ICD-10-CM | POA: Diagnosis not present

## 2023-10-14 DIAGNOSIS — X58XXXS Exposure to other specified factors, sequela: Secondary | ICD-10-CM | POA: Diagnosis not present

## 2023-10-14 DIAGNOSIS — Z993 Dependence on wheelchair: Secondary | ICD-10-CM | POA: Diagnosis not present

## 2023-10-14 DIAGNOSIS — C670 Malignant neoplasm of trigone of bladder: Secondary | ICD-10-CM | POA: Diagnosis not present

## 2023-10-14 DIAGNOSIS — L89322 Pressure ulcer of left buttock, stage 2: Secondary | ICD-10-CM | POA: Diagnosis not present

## 2023-10-14 DIAGNOSIS — E782 Mixed hyperlipidemia: Secondary | ICD-10-CM | POA: Diagnosis not present

## 2023-10-14 DIAGNOSIS — R4701 Aphasia: Secondary | ICD-10-CM | POA: Diagnosis not present

## 2023-10-14 DIAGNOSIS — S065X9S Traumatic subdural hemorrhage with loss of consciousness of unspecified duration, sequela: Secondary | ICD-10-CM | POA: Diagnosis not present

## 2023-10-14 DIAGNOSIS — R531 Weakness: Secondary | ICD-10-CM | POA: Diagnosis not present

## 2023-10-16 DIAGNOSIS — R159 Full incontinence of feces: Secondary | ICD-10-CM | POA: Diagnosis not present

## 2023-10-16 DIAGNOSIS — X58XXXS Exposure to other specified factors, sequela: Secondary | ICD-10-CM | POA: Diagnosis not present

## 2023-10-16 DIAGNOSIS — L89322 Pressure ulcer of left buttock, stage 2: Secondary | ICD-10-CM | POA: Diagnosis not present

## 2023-10-16 DIAGNOSIS — R4701 Aphasia: Secondary | ICD-10-CM | POA: Diagnosis not present

## 2023-10-16 DIAGNOSIS — R2689 Other abnormalities of gait and mobility: Secondary | ICD-10-CM | POA: Diagnosis not present

## 2023-10-16 DIAGNOSIS — C651 Malignant neoplasm of right renal pelvis: Secondary | ICD-10-CM | POA: Diagnosis not present

## 2023-10-16 DIAGNOSIS — R531 Weakness: Secondary | ICD-10-CM | POA: Diagnosis not present

## 2023-10-16 DIAGNOSIS — E782 Mixed hyperlipidemia: Secondary | ICD-10-CM | POA: Diagnosis not present

## 2023-10-16 DIAGNOSIS — L89312 Pressure ulcer of right buttock, stage 2: Secondary | ICD-10-CM | POA: Diagnosis not present

## 2023-10-16 DIAGNOSIS — Z993 Dependence on wheelchair: Secondary | ICD-10-CM | POA: Diagnosis not present

## 2023-10-16 DIAGNOSIS — R32 Unspecified urinary incontinence: Secondary | ICD-10-CM | POA: Diagnosis not present

## 2023-10-16 DIAGNOSIS — S065X9S Traumatic subdural hemorrhage with loss of consciousness of unspecified duration, sequela: Secondary | ICD-10-CM | POA: Diagnosis not present

## 2023-10-17 DIAGNOSIS — Z8679 Personal history of other diseases of the circulatory system: Secondary | ICD-10-CM | POA: Diagnosis not present

## 2023-10-17 DIAGNOSIS — C649 Malignant neoplasm of unspecified kidney, except renal pelvis: Secondary | ICD-10-CM | POA: Diagnosis not present

## 2023-10-17 DIAGNOSIS — C7910 Secondary malignant neoplasm of unspecified urinary organs: Secondary | ICD-10-CM | POA: Diagnosis not present

## 2023-10-17 DIAGNOSIS — E854 Organ-limited amyloidosis: Secondary | ICD-10-CM | POA: Diagnosis not present

## 2023-10-17 DIAGNOSIS — Z23 Encounter for immunization: Secondary | ICD-10-CM | POA: Diagnosis not present

## 2023-10-17 DIAGNOSIS — C7800 Secondary malignant neoplasm of unspecified lung: Secondary | ICD-10-CM | POA: Diagnosis not present

## 2023-10-17 DIAGNOSIS — N1330 Unspecified hydronephrosis: Secondary | ICD-10-CM | POA: Diagnosis not present

## 2023-10-17 DIAGNOSIS — I69322 Dysarthria following cerebral infarction: Secondary | ICD-10-CM | POA: Diagnosis not present

## 2023-10-17 DIAGNOSIS — C7931 Secondary malignant neoplasm of brain: Secondary | ICD-10-CM | POA: Diagnosis not present

## 2023-10-17 DIAGNOSIS — Z902 Acquired absence of lung [part of]: Secondary | ICD-10-CM | POA: Diagnosis not present

## 2023-10-17 DIAGNOSIS — Z7982 Long term (current) use of aspirin: Secondary | ICD-10-CM | POA: Diagnosis not present

## 2023-10-17 DIAGNOSIS — I680 Cerebral amyloid angiopathy: Secondary | ICD-10-CM | POA: Diagnosis not present

## 2023-10-17 DIAGNOSIS — Z79899 Other long term (current) drug therapy: Secondary | ICD-10-CM | POA: Diagnosis not present

## 2023-10-20 DIAGNOSIS — R2689 Other abnormalities of gait and mobility: Secondary | ICD-10-CM | POA: Diagnosis not present

## 2023-10-20 DIAGNOSIS — R531 Weakness: Secondary | ICD-10-CM | POA: Diagnosis not present

## 2023-10-20 DIAGNOSIS — S065X9S Traumatic subdural hemorrhage with loss of consciousness of unspecified duration, sequela: Secondary | ICD-10-CM | POA: Diagnosis not present

## 2023-10-20 DIAGNOSIS — R32 Unspecified urinary incontinence: Secondary | ICD-10-CM | POA: Diagnosis not present

## 2023-10-20 DIAGNOSIS — E782 Mixed hyperlipidemia: Secondary | ICD-10-CM | POA: Diagnosis not present

## 2023-10-20 DIAGNOSIS — L89312 Pressure ulcer of right buttock, stage 2: Secondary | ICD-10-CM | POA: Diagnosis not present

## 2023-10-20 DIAGNOSIS — C651 Malignant neoplasm of right renal pelvis: Secondary | ICD-10-CM | POA: Diagnosis not present

## 2023-10-20 DIAGNOSIS — L89322 Pressure ulcer of left buttock, stage 2: Secondary | ICD-10-CM | POA: Diagnosis not present

## 2023-10-20 DIAGNOSIS — R159 Full incontinence of feces: Secondary | ICD-10-CM | POA: Diagnosis not present

## 2023-10-20 DIAGNOSIS — R4701 Aphasia: Secondary | ICD-10-CM | POA: Diagnosis not present

## 2023-10-20 DIAGNOSIS — X58XXXS Exposure to other specified factors, sequela: Secondary | ICD-10-CM | POA: Diagnosis not present

## 2023-10-20 DIAGNOSIS — Z993 Dependence on wheelchair: Secondary | ICD-10-CM | POA: Diagnosis not present

## 2023-10-21 DIAGNOSIS — R2689 Other abnormalities of gait and mobility: Secondary | ICD-10-CM | POA: Diagnosis not present

## 2023-10-21 DIAGNOSIS — R159 Full incontinence of feces: Secondary | ICD-10-CM | POA: Diagnosis not present

## 2023-10-21 DIAGNOSIS — R531 Weakness: Secondary | ICD-10-CM | POA: Diagnosis not present

## 2023-10-21 DIAGNOSIS — C651 Malignant neoplasm of right renal pelvis: Secondary | ICD-10-CM | POA: Diagnosis not present

## 2023-10-21 DIAGNOSIS — E782 Mixed hyperlipidemia: Secondary | ICD-10-CM | POA: Diagnosis not present

## 2023-10-21 DIAGNOSIS — R4701 Aphasia: Secondary | ICD-10-CM | POA: Diagnosis not present

## 2023-10-21 DIAGNOSIS — L89312 Pressure ulcer of right buttock, stage 2: Secondary | ICD-10-CM | POA: Diagnosis not present

## 2023-10-21 DIAGNOSIS — R32 Unspecified urinary incontinence: Secondary | ICD-10-CM | POA: Diagnosis not present

## 2023-10-21 DIAGNOSIS — X58XXXS Exposure to other specified factors, sequela: Secondary | ICD-10-CM | POA: Diagnosis not present

## 2023-10-21 DIAGNOSIS — S065X9S Traumatic subdural hemorrhage with loss of consciousness of unspecified duration, sequela: Secondary | ICD-10-CM | POA: Diagnosis not present

## 2023-10-21 DIAGNOSIS — Z993 Dependence on wheelchair: Secondary | ICD-10-CM | POA: Diagnosis not present

## 2023-10-21 DIAGNOSIS — L89322 Pressure ulcer of left buttock, stage 2: Secondary | ICD-10-CM | POA: Diagnosis not present

## 2023-10-23 DIAGNOSIS — R32 Unspecified urinary incontinence: Secondary | ICD-10-CM | POA: Diagnosis not present

## 2023-10-23 DIAGNOSIS — X58XXXS Exposure to other specified factors, sequela: Secondary | ICD-10-CM | POA: Diagnosis not present

## 2023-10-23 DIAGNOSIS — E782 Mixed hyperlipidemia: Secondary | ICD-10-CM | POA: Diagnosis not present

## 2023-10-23 DIAGNOSIS — Z993 Dependence on wheelchair: Secondary | ICD-10-CM | POA: Diagnosis not present

## 2023-10-23 DIAGNOSIS — R531 Weakness: Secondary | ICD-10-CM | POA: Diagnosis not present

## 2023-10-23 DIAGNOSIS — C651 Malignant neoplasm of right renal pelvis: Secondary | ICD-10-CM | POA: Diagnosis not present

## 2023-10-23 DIAGNOSIS — S065X9S Traumatic subdural hemorrhage with loss of consciousness of unspecified duration, sequela: Secondary | ICD-10-CM | POA: Diagnosis not present

## 2023-10-23 DIAGNOSIS — R4701 Aphasia: Secondary | ICD-10-CM | POA: Diagnosis not present

## 2023-10-23 DIAGNOSIS — L89312 Pressure ulcer of right buttock, stage 2: Secondary | ICD-10-CM | POA: Diagnosis not present

## 2023-10-23 DIAGNOSIS — L89322 Pressure ulcer of left buttock, stage 2: Secondary | ICD-10-CM | POA: Diagnosis not present

## 2023-10-23 DIAGNOSIS — L89152 Pressure ulcer of sacral region, stage 2: Secondary | ICD-10-CM | POA: Diagnosis not present

## 2023-10-23 DIAGNOSIS — R159 Full incontinence of feces: Secondary | ICD-10-CM | POA: Diagnosis not present

## 2023-10-23 DIAGNOSIS — R2689 Other abnormalities of gait and mobility: Secondary | ICD-10-CM | POA: Diagnosis not present

## 2023-10-27 DIAGNOSIS — S065X9S Traumatic subdural hemorrhage with loss of consciousness of unspecified duration, sequela: Secondary | ICD-10-CM | POA: Diagnosis not present

## 2023-10-27 DIAGNOSIS — R4701 Aphasia: Secondary | ICD-10-CM | POA: Diagnosis not present

## 2023-10-27 DIAGNOSIS — E782 Mixed hyperlipidemia: Secondary | ICD-10-CM | POA: Diagnosis not present

## 2023-10-27 DIAGNOSIS — R531 Weakness: Secondary | ICD-10-CM | POA: Diagnosis not present

## 2023-10-27 DIAGNOSIS — X58XXXS Exposure to other specified factors, sequela: Secondary | ICD-10-CM | POA: Diagnosis not present

## 2023-10-27 DIAGNOSIS — R32 Unspecified urinary incontinence: Secondary | ICD-10-CM | POA: Diagnosis not present

## 2023-10-27 DIAGNOSIS — L89312 Pressure ulcer of right buttock, stage 2: Secondary | ICD-10-CM | POA: Diagnosis not present

## 2023-10-27 DIAGNOSIS — R159 Full incontinence of feces: Secondary | ICD-10-CM | POA: Diagnosis not present

## 2023-10-27 DIAGNOSIS — Z993 Dependence on wheelchair: Secondary | ICD-10-CM | POA: Diagnosis not present

## 2023-10-27 DIAGNOSIS — L89322 Pressure ulcer of left buttock, stage 2: Secondary | ICD-10-CM | POA: Diagnosis not present

## 2023-10-27 DIAGNOSIS — C651 Malignant neoplasm of right renal pelvis: Secondary | ICD-10-CM | POA: Diagnosis not present

## 2023-10-27 DIAGNOSIS — R2689 Other abnormalities of gait and mobility: Secondary | ICD-10-CM | POA: Diagnosis not present

## 2023-10-28 DIAGNOSIS — C651 Malignant neoplasm of right renal pelvis: Secondary | ICD-10-CM | POA: Diagnosis not present

## 2023-10-28 DIAGNOSIS — S065X9S Traumatic subdural hemorrhage with loss of consciousness of unspecified duration, sequela: Secondary | ICD-10-CM | POA: Diagnosis not present

## 2023-10-28 DIAGNOSIS — R531 Weakness: Secondary | ICD-10-CM | POA: Diagnosis not present

## 2023-10-28 DIAGNOSIS — L89312 Pressure ulcer of right buttock, stage 2: Secondary | ICD-10-CM | POA: Diagnosis not present

## 2023-10-28 DIAGNOSIS — R32 Unspecified urinary incontinence: Secondary | ICD-10-CM | POA: Diagnosis not present

## 2023-10-28 DIAGNOSIS — L89322 Pressure ulcer of left buttock, stage 2: Secondary | ICD-10-CM | POA: Diagnosis not present

## 2023-10-28 DIAGNOSIS — R4701 Aphasia: Secondary | ICD-10-CM | POA: Diagnosis not present

## 2023-10-28 DIAGNOSIS — E782 Mixed hyperlipidemia: Secondary | ICD-10-CM | POA: Diagnosis not present

## 2023-10-28 DIAGNOSIS — R159 Full incontinence of feces: Secondary | ICD-10-CM | POA: Diagnosis not present

## 2023-10-28 DIAGNOSIS — Z993 Dependence on wheelchair: Secondary | ICD-10-CM | POA: Diagnosis not present

## 2023-10-28 DIAGNOSIS — X58XXXS Exposure to other specified factors, sequela: Secondary | ICD-10-CM | POA: Diagnosis not present

## 2023-10-28 DIAGNOSIS — R2689 Other abnormalities of gait and mobility: Secondary | ICD-10-CM | POA: Diagnosis not present

## 2023-10-29 DIAGNOSIS — C659 Malignant neoplasm of unspecified renal pelvis: Secondary | ICD-10-CM | POA: Diagnosis not present

## 2023-10-30 ENCOUNTER — Encounter (HOSPITAL_COMMUNITY): Payer: Self-pay

## 2023-10-30 DIAGNOSIS — R159 Full incontinence of feces: Secondary | ICD-10-CM | POA: Diagnosis not present

## 2023-10-30 DIAGNOSIS — R4701 Aphasia: Secondary | ICD-10-CM | POA: Diagnosis not present

## 2023-10-30 DIAGNOSIS — L89322 Pressure ulcer of left buttock, stage 2: Secondary | ICD-10-CM | POA: Diagnosis not present

## 2023-10-30 DIAGNOSIS — C651 Malignant neoplasm of right renal pelvis: Secondary | ICD-10-CM | POA: Diagnosis not present

## 2023-10-30 DIAGNOSIS — S065X9S Traumatic subdural hemorrhage with loss of consciousness of unspecified duration, sequela: Secondary | ICD-10-CM | POA: Diagnosis not present

## 2023-10-30 DIAGNOSIS — Z993 Dependence on wheelchair: Secondary | ICD-10-CM | POA: Diagnosis not present

## 2023-10-30 DIAGNOSIS — X58XXXS Exposure to other specified factors, sequela: Secondary | ICD-10-CM | POA: Diagnosis not present

## 2023-10-30 DIAGNOSIS — L89312 Pressure ulcer of right buttock, stage 2: Secondary | ICD-10-CM | POA: Diagnosis not present

## 2023-10-30 DIAGNOSIS — R32 Unspecified urinary incontinence: Secondary | ICD-10-CM | POA: Diagnosis not present

## 2023-10-30 DIAGNOSIS — E782 Mixed hyperlipidemia: Secondary | ICD-10-CM | POA: Diagnosis not present

## 2023-10-30 DIAGNOSIS — R531 Weakness: Secondary | ICD-10-CM | POA: Diagnosis not present

## 2023-10-30 DIAGNOSIS — R2689 Other abnormalities of gait and mobility: Secondary | ICD-10-CM | POA: Diagnosis not present

## 2023-10-31 DIAGNOSIS — C7989 Secondary malignant neoplasm of other specified sites: Secondary | ICD-10-CM | POA: Diagnosis not present

## 2023-10-31 DIAGNOSIS — C7910 Secondary malignant neoplasm of unspecified urinary organs: Secondary | ICD-10-CM | POA: Diagnosis not present

## 2023-10-31 DIAGNOSIS — C679 Malignant neoplasm of bladder, unspecified: Secondary | ICD-10-CM | POA: Diagnosis not present

## 2023-10-31 DIAGNOSIS — R319 Hematuria, unspecified: Secondary | ICD-10-CM | POA: Diagnosis not present

## 2023-10-31 DIAGNOSIS — C662 Malignant neoplasm of left ureter: Secondary | ICD-10-CM | POA: Diagnosis not present

## 2023-10-31 DIAGNOSIS — C661 Malignant neoplasm of right ureter: Secondary | ICD-10-CM | POA: Diagnosis not present

## 2023-10-31 DIAGNOSIS — R590 Localized enlarged lymph nodes: Secondary | ICD-10-CM | POA: Diagnosis not present

## 2023-10-31 DIAGNOSIS — C787 Secondary malignant neoplasm of liver and intrahepatic bile duct: Secondary | ICD-10-CM | POA: Diagnosis not present

## 2023-10-31 DIAGNOSIS — R918 Other nonspecific abnormal finding of lung field: Secondary | ICD-10-CM | POA: Diagnosis not present

## 2023-10-31 DIAGNOSIS — C651 Malignant neoplasm of right renal pelvis: Secondary | ICD-10-CM | POA: Diagnosis not present

## 2023-10-31 DIAGNOSIS — N1330 Unspecified hydronephrosis: Secondary | ICD-10-CM | POA: Diagnosis not present

## 2023-11-02 DIAGNOSIS — C651 Malignant neoplasm of right renal pelvis: Secondary | ICD-10-CM | POA: Diagnosis not present

## 2023-11-02 DIAGNOSIS — X58XXXS Exposure to other specified factors, sequela: Secondary | ICD-10-CM | POA: Diagnosis not present

## 2023-11-02 DIAGNOSIS — L89322 Pressure ulcer of left buttock, stage 2: Secondary | ICD-10-CM | POA: Diagnosis not present

## 2023-11-02 DIAGNOSIS — R531 Weakness: Secondary | ICD-10-CM | POA: Diagnosis not present

## 2023-11-02 DIAGNOSIS — R2689 Other abnormalities of gait and mobility: Secondary | ICD-10-CM | POA: Diagnosis not present

## 2023-11-02 DIAGNOSIS — L89312 Pressure ulcer of right buttock, stage 2: Secondary | ICD-10-CM | POA: Diagnosis not present

## 2023-11-02 DIAGNOSIS — S065X9S Traumatic subdural hemorrhage with loss of consciousness of unspecified duration, sequela: Secondary | ICD-10-CM | POA: Diagnosis not present

## 2023-11-02 DIAGNOSIS — E782 Mixed hyperlipidemia: Secondary | ICD-10-CM | POA: Diagnosis not present

## 2023-11-02 DIAGNOSIS — R159 Full incontinence of feces: Secondary | ICD-10-CM | POA: Diagnosis not present

## 2023-11-02 DIAGNOSIS — R32 Unspecified urinary incontinence: Secondary | ICD-10-CM | POA: Diagnosis not present

## 2023-11-02 DIAGNOSIS — R4701 Aphasia: Secondary | ICD-10-CM | POA: Diagnosis not present

## 2023-11-02 DIAGNOSIS — Z993 Dependence on wheelchair: Secondary | ICD-10-CM | POA: Diagnosis not present

## 2023-11-04 DIAGNOSIS — C7910 Secondary malignant neoplasm of unspecified urinary organs: Secondary | ICD-10-CM | POA: Diagnosis not present

## 2023-11-04 DIAGNOSIS — R591 Generalized enlarged lymph nodes: Secondary | ICD-10-CM | POA: Diagnosis not present

## 2023-11-04 DIAGNOSIS — Z993 Dependence on wheelchair: Secondary | ICD-10-CM | POA: Diagnosis not present

## 2023-11-04 DIAGNOSIS — R531 Weakness: Secondary | ICD-10-CM | POA: Diagnosis not present

## 2023-11-04 DIAGNOSIS — L89322 Pressure ulcer of left buttock, stage 2: Secondary | ICD-10-CM | POA: Diagnosis not present

## 2023-11-04 DIAGNOSIS — R4701 Aphasia: Secondary | ICD-10-CM | POA: Diagnosis not present

## 2023-11-04 DIAGNOSIS — X58XXXS Exposure to other specified factors, sequela: Secondary | ICD-10-CM | POA: Diagnosis not present

## 2023-11-04 DIAGNOSIS — R159 Full incontinence of feces: Secondary | ICD-10-CM | POA: Diagnosis not present

## 2023-11-04 DIAGNOSIS — R32 Unspecified urinary incontinence: Secondary | ICD-10-CM | POA: Diagnosis not present

## 2023-11-04 DIAGNOSIS — C651 Malignant neoplasm of right renal pelvis: Secondary | ICD-10-CM | POA: Diagnosis not present

## 2023-11-04 DIAGNOSIS — R2689 Other abnormalities of gait and mobility: Secondary | ICD-10-CM | POA: Diagnosis not present

## 2023-11-04 DIAGNOSIS — E782 Mixed hyperlipidemia: Secondary | ICD-10-CM | POA: Diagnosis not present

## 2023-11-04 DIAGNOSIS — S065X9S Traumatic subdural hemorrhage with loss of consciousness of unspecified duration, sequela: Secondary | ICD-10-CM | POA: Diagnosis not present

## 2023-11-04 DIAGNOSIS — L89312 Pressure ulcer of right buttock, stage 2: Secondary | ICD-10-CM | POA: Diagnosis not present

## 2023-11-05 DIAGNOSIS — L89312 Pressure ulcer of right buttock, stage 2: Secondary | ICD-10-CM | POA: Diagnosis not present

## 2023-11-05 DIAGNOSIS — X58XXXS Exposure to other specified factors, sequela: Secondary | ICD-10-CM | POA: Diagnosis not present

## 2023-11-05 DIAGNOSIS — R32 Unspecified urinary incontinence: Secondary | ICD-10-CM | POA: Diagnosis not present

## 2023-11-05 DIAGNOSIS — C651 Malignant neoplasm of right renal pelvis: Secondary | ICD-10-CM | POA: Diagnosis not present

## 2023-11-05 DIAGNOSIS — R4701 Aphasia: Secondary | ICD-10-CM | POA: Diagnosis not present

## 2023-11-05 DIAGNOSIS — L89322 Pressure ulcer of left buttock, stage 2: Secondary | ICD-10-CM | POA: Diagnosis not present

## 2023-11-05 DIAGNOSIS — R2689 Other abnormalities of gait and mobility: Secondary | ICD-10-CM | POA: Diagnosis not present

## 2023-11-05 DIAGNOSIS — Z993 Dependence on wheelchair: Secondary | ICD-10-CM | POA: Diagnosis not present

## 2023-11-05 DIAGNOSIS — R159 Full incontinence of feces: Secondary | ICD-10-CM | POA: Diagnosis not present

## 2023-11-05 DIAGNOSIS — S065X9S Traumatic subdural hemorrhage with loss of consciousness of unspecified duration, sequela: Secondary | ICD-10-CM | POA: Diagnosis not present

## 2023-11-05 DIAGNOSIS — R531 Weakness: Secondary | ICD-10-CM | POA: Diagnosis not present

## 2023-11-05 DIAGNOSIS — E782 Mixed hyperlipidemia: Secondary | ICD-10-CM | POA: Diagnosis not present

## 2023-11-07 DIAGNOSIS — I6930 Unspecified sequelae of cerebral infarction: Secondary | ICD-10-CM | POA: Diagnosis not present

## 2023-11-07 DIAGNOSIS — I639 Cerebral infarction, unspecified: Secondary | ICD-10-CM | POA: Diagnosis not present

## 2023-11-07 DIAGNOSIS — C7910 Secondary malignant neoplasm of unspecified urinary organs: Secondary | ICD-10-CM | POA: Diagnosis not present

## 2023-11-11 DIAGNOSIS — R2689 Other abnormalities of gait and mobility: Secondary | ICD-10-CM | POA: Diagnosis not present

## 2023-11-11 DIAGNOSIS — X58XXXS Exposure to other specified factors, sequela: Secondary | ICD-10-CM | POA: Diagnosis not present

## 2023-11-11 DIAGNOSIS — Z993 Dependence on wheelchair: Secondary | ICD-10-CM | POA: Diagnosis not present

## 2023-11-11 DIAGNOSIS — R32 Unspecified urinary incontinence: Secondary | ICD-10-CM | POA: Diagnosis not present

## 2023-11-11 DIAGNOSIS — C651 Malignant neoplasm of right renal pelvis: Secondary | ICD-10-CM | POA: Diagnosis not present

## 2023-11-11 DIAGNOSIS — S065X9S Traumatic subdural hemorrhage with loss of consciousness of unspecified duration, sequela: Secondary | ICD-10-CM | POA: Diagnosis not present

## 2023-11-11 DIAGNOSIS — R159 Full incontinence of feces: Secondary | ICD-10-CM | POA: Diagnosis not present

## 2023-11-11 DIAGNOSIS — R531 Weakness: Secondary | ICD-10-CM | POA: Diagnosis not present

## 2023-11-11 DIAGNOSIS — L89312 Pressure ulcer of right buttock, stage 2: Secondary | ICD-10-CM | POA: Diagnosis not present

## 2023-11-11 DIAGNOSIS — E782 Mixed hyperlipidemia: Secondary | ICD-10-CM | POA: Diagnosis not present

## 2023-11-11 DIAGNOSIS — R4701 Aphasia: Secondary | ICD-10-CM | POA: Diagnosis not present

## 2023-11-11 DIAGNOSIS — L89322 Pressure ulcer of left buttock, stage 2: Secondary | ICD-10-CM | POA: Diagnosis not present

## 2023-11-13 DIAGNOSIS — L89312 Pressure ulcer of right buttock, stage 2: Secondary | ICD-10-CM | POA: Diagnosis not present

## 2023-11-13 DIAGNOSIS — E782 Mixed hyperlipidemia: Secondary | ICD-10-CM | POA: Diagnosis not present

## 2023-11-13 DIAGNOSIS — X58XXXS Exposure to other specified factors, sequela: Secondary | ICD-10-CM | POA: Diagnosis not present

## 2023-11-13 DIAGNOSIS — L89322 Pressure ulcer of left buttock, stage 2: Secondary | ICD-10-CM | POA: Diagnosis not present

## 2023-11-13 DIAGNOSIS — S065X9S Traumatic subdural hemorrhage with loss of consciousness of unspecified duration, sequela: Secondary | ICD-10-CM | POA: Diagnosis not present

## 2023-11-13 DIAGNOSIS — R4701 Aphasia: Secondary | ICD-10-CM | POA: Diagnosis not present

## 2023-11-13 DIAGNOSIS — C651 Malignant neoplasm of right renal pelvis: Secondary | ICD-10-CM | POA: Diagnosis not present

## 2023-11-13 DIAGNOSIS — R159 Full incontinence of feces: Secondary | ICD-10-CM | POA: Diagnosis not present

## 2023-11-13 DIAGNOSIS — Z993 Dependence on wheelchair: Secondary | ICD-10-CM | POA: Diagnosis not present

## 2023-11-13 DIAGNOSIS — R2689 Other abnormalities of gait and mobility: Secondary | ICD-10-CM | POA: Diagnosis not present

## 2023-11-13 DIAGNOSIS — R32 Unspecified urinary incontinence: Secondary | ICD-10-CM | POA: Diagnosis not present

## 2023-11-13 DIAGNOSIS — R531 Weakness: Secondary | ICD-10-CM | POA: Diagnosis not present

## 2023-11-14 DIAGNOSIS — R159 Full incontinence of feces: Secondary | ICD-10-CM | POA: Diagnosis not present

## 2023-11-14 DIAGNOSIS — S065X9S Traumatic subdural hemorrhage with loss of consciousness of unspecified duration, sequela: Secondary | ICD-10-CM | POA: Diagnosis not present

## 2023-11-14 DIAGNOSIS — R32 Unspecified urinary incontinence: Secondary | ICD-10-CM | POA: Diagnosis not present

## 2023-11-14 DIAGNOSIS — E782 Mixed hyperlipidemia: Secondary | ICD-10-CM | POA: Diagnosis not present

## 2023-11-14 DIAGNOSIS — X58XXXS Exposure to other specified factors, sequela: Secondary | ICD-10-CM | POA: Diagnosis not present

## 2023-11-14 DIAGNOSIS — Z993 Dependence on wheelchair: Secondary | ICD-10-CM | POA: Diagnosis not present

## 2023-11-14 DIAGNOSIS — R4701 Aphasia: Secondary | ICD-10-CM | POA: Diagnosis not present

## 2023-11-14 DIAGNOSIS — L89312 Pressure ulcer of right buttock, stage 2: Secondary | ICD-10-CM | POA: Diagnosis not present

## 2023-11-14 DIAGNOSIS — L89322 Pressure ulcer of left buttock, stage 2: Secondary | ICD-10-CM | POA: Diagnosis not present

## 2023-11-14 DIAGNOSIS — C651 Malignant neoplasm of right renal pelvis: Secondary | ICD-10-CM | POA: Diagnosis not present

## 2023-11-14 DIAGNOSIS — R531 Weakness: Secondary | ICD-10-CM | POA: Diagnosis not present

## 2023-11-14 DIAGNOSIS — R2689 Other abnormalities of gait and mobility: Secondary | ICD-10-CM | POA: Diagnosis not present

## 2023-11-18 DIAGNOSIS — E782 Mixed hyperlipidemia: Secondary | ICD-10-CM | POA: Diagnosis not present

## 2023-11-18 DIAGNOSIS — L89312 Pressure ulcer of right buttock, stage 2: Secondary | ICD-10-CM | POA: Diagnosis not present

## 2023-11-18 DIAGNOSIS — R2689 Other abnormalities of gait and mobility: Secondary | ICD-10-CM | POA: Diagnosis not present

## 2023-11-18 DIAGNOSIS — R159 Full incontinence of feces: Secondary | ICD-10-CM | POA: Diagnosis not present

## 2023-11-18 DIAGNOSIS — X58XXXS Exposure to other specified factors, sequela: Secondary | ICD-10-CM | POA: Diagnosis not present

## 2023-11-18 DIAGNOSIS — R32 Unspecified urinary incontinence: Secondary | ICD-10-CM | POA: Diagnosis not present

## 2023-11-18 DIAGNOSIS — L89322 Pressure ulcer of left buttock, stage 2: Secondary | ICD-10-CM | POA: Diagnosis not present

## 2023-11-18 DIAGNOSIS — R4701 Aphasia: Secondary | ICD-10-CM | POA: Diagnosis not present

## 2023-11-18 DIAGNOSIS — S065X9S Traumatic subdural hemorrhage with loss of consciousness of unspecified duration, sequela: Secondary | ICD-10-CM | POA: Diagnosis not present

## 2023-11-18 DIAGNOSIS — Z993 Dependence on wheelchair: Secondary | ICD-10-CM | POA: Diagnosis not present

## 2023-11-18 DIAGNOSIS — C651 Malignant neoplasm of right renal pelvis: Secondary | ICD-10-CM | POA: Diagnosis not present

## 2023-11-18 DIAGNOSIS — R531 Weakness: Secondary | ICD-10-CM | POA: Diagnosis not present

## 2023-11-20 DIAGNOSIS — L89322 Pressure ulcer of left buttock, stage 2: Secondary | ICD-10-CM | POA: Diagnosis not present

## 2023-11-20 DIAGNOSIS — S065X9S Traumatic subdural hemorrhage with loss of consciousness of unspecified duration, sequela: Secondary | ICD-10-CM | POA: Diagnosis not present

## 2023-11-20 DIAGNOSIS — L89312 Pressure ulcer of right buttock, stage 2: Secondary | ICD-10-CM | POA: Diagnosis not present

## 2023-11-20 DIAGNOSIS — R32 Unspecified urinary incontinence: Secondary | ICD-10-CM | POA: Diagnosis not present

## 2023-11-20 DIAGNOSIS — C651 Malignant neoplasm of right renal pelvis: Secondary | ICD-10-CM | POA: Diagnosis not present

## 2023-11-20 DIAGNOSIS — R531 Weakness: Secondary | ICD-10-CM | POA: Diagnosis not present

## 2023-11-20 DIAGNOSIS — E782 Mixed hyperlipidemia: Secondary | ICD-10-CM | POA: Diagnosis not present

## 2023-11-20 DIAGNOSIS — R159 Full incontinence of feces: Secondary | ICD-10-CM | POA: Diagnosis not present

## 2023-11-20 DIAGNOSIS — R4701 Aphasia: Secondary | ICD-10-CM | POA: Diagnosis not present

## 2023-11-20 DIAGNOSIS — Z993 Dependence on wheelchair: Secondary | ICD-10-CM | POA: Diagnosis not present

## 2023-11-20 DIAGNOSIS — X58XXXS Exposure to other specified factors, sequela: Secondary | ICD-10-CM | POA: Diagnosis not present

## 2023-11-20 DIAGNOSIS — R2689 Other abnormalities of gait and mobility: Secondary | ICD-10-CM | POA: Diagnosis not present

## 2023-11-21 DIAGNOSIS — R159 Full incontinence of feces: Secondary | ICD-10-CM | POA: Diagnosis not present

## 2023-11-21 DIAGNOSIS — R32 Unspecified urinary incontinence: Secondary | ICD-10-CM | POA: Diagnosis not present

## 2023-11-21 DIAGNOSIS — R4701 Aphasia: Secondary | ICD-10-CM | POA: Diagnosis not present

## 2023-11-21 DIAGNOSIS — E782 Mixed hyperlipidemia: Secondary | ICD-10-CM | POA: Diagnosis not present

## 2023-11-21 DIAGNOSIS — R531 Weakness: Secondary | ICD-10-CM | POA: Diagnosis not present

## 2023-11-21 DIAGNOSIS — L89312 Pressure ulcer of right buttock, stage 2: Secondary | ICD-10-CM | POA: Diagnosis not present

## 2023-11-21 DIAGNOSIS — L89322 Pressure ulcer of left buttock, stage 2: Secondary | ICD-10-CM | POA: Diagnosis not present

## 2023-11-21 DIAGNOSIS — R2689 Other abnormalities of gait and mobility: Secondary | ICD-10-CM | POA: Diagnosis not present

## 2023-11-21 DIAGNOSIS — S065X9S Traumatic subdural hemorrhage with loss of consciousness of unspecified duration, sequela: Secondary | ICD-10-CM | POA: Diagnosis not present

## 2023-11-21 DIAGNOSIS — Z993 Dependence on wheelchair: Secondary | ICD-10-CM | POA: Diagnosis not present

## 2023-11-21 DIAGNOSIS — X58XXXS Exposure to other specified factors, sequela: Secondary | ICD-10-CM | POA: Diagnosis not present

## 2023-11-21 DIAGNOSIS — C651 Malignant neoplasm of right renal pelvis: Secondary | ICD-10-CM | POA: Diagnosis not present

## 2023-11-25 DIAGNOSIS — R4701 Aphasia: Secondary | ICD-10-CM | POA: Diagnosis not present

## 2023-11-25 DIAGNOSIS — C651 Malignant neoplasm of right renal pelvis: Secondary | ICD-10-CM | POA: Diagnosis not present

## 2023-11-25 DIAGNOSIS — X58XXXS Exposure to other specified factors, sequela: Secondary | ICD-10-CM | POA: Diagnosis not present

## 2023-11-25 DIAGNOSIS — R32 Unspecified urinary incontinence: Secondary | ICD-10-CM | POA: Diagnosis not present

## 2023-11-25 DIAGNOSIS — L89312 Pressure ulcer of right buttock, stage 2: Secondary | ICD-10-CM | POA: Diagnosis not present

## 2023-11-25 DIAGNOSIS — E782 Mixed hyperlipidemia: Secondary | ICD-10-CM | POA: Diagnosis not present

## 2023-11-25 DIAGNOSIS — Z993 Dependence on wheelchair: Secondary | ICD-10-CM | POA: Diagnosis not present

## 2023-11-25 DIAGNOSIS — R159 Full incontinence of feces: Secondary | ICD-10-CM | POA: Diagnosis not present

## 2023-11-25 DIAGNOSIS — R2689 Other abnormalities of gait and mobility: Secondary | ICD-10-CM | POA: Diagnosis not present

## 2023-11-25 DIAGNOSIS — R531 Weakness: Secondary | ICD-10-CM | POA: Diagnosis not present

## 2023-11-25 DIAGNOSIS — L89322 Pressure ulcer of left buttock, stage 2: Secondary | ICD-10-CM | POA: Diagnosis not present

## 2023-11-25 DIAGNOSIS — S065X9S Traumatic subdural hemorrhage with loss of consciousness of unspecified duration, sequela: Secondary | ICD-10-CM | POA: Diagnosis not present

## 2023-11-26 DIAGNOSIS — R531 Weakness: Secondary | ICD-10-CM | POA: Diagnosis not present

## 2023-11-26 DIAGNOSIS — R2689 Other abnormalities of gait and mobility: Secondary | ICD-10-CM | POA: Diagnosis not present

## 2023-11-26 DIAGNOSIS — R159 Full incontinence of feces: Secondary | ICD-10-CM | POA: Diagnosis not present

## 2023-11-26 DIAGNOSIS — C651 Malignant neoplasm of right renal pelvis: Secondary | ICD-10-CM | POA: Diagnosis not present

## 2023-11-26 DIAGNOSIS — E782 Mixed hyperlipidemia: Secondary | ICD-10-CM | POA: Diagnosis not present

## 2023-11-26 DIAGNOSIS — X58XXXS Exposure to other specified factors, sequela: Secondary | ICD-10-CM | POA: Diagnosis not present

## 2023-11-26 DIAGNOSIS — L89322 Pressure ulcer of left buttock, stage 2: Secondary | ICD-10-CM | POA: Diagnosis not present

## 2023-11-26 DIAGNOSIS — R4701 Aphasia: Secondary | ICD-10-CM | POA: Diagnosis not present

## 2023-11-26 DIAGNOSIS — L89312 Pressure ulcer of right buttock, stage 2: Secondary | ICD-10-CM | POA: Diagnosis not present

## 2023-11-26 DIAGNOSIS — Z993 Dependence on wheelchair: Secondary | ICD-10-CM | POA: Diagnosis not present

## 2023-11-26 DIAGNOSIS — R32 Unspecified urinary incontinence: Secondary | ICD-10-CM | POA: Diagnosis not present

## 2023-11-26 DIAGNOSIS — S065X9S Traumatic subdural hemorrhage with loss of consciousness of unspecified duration, sequela: Secondary | ICD-10-CM | POA: Diagnosis not present

## 2023-11-27 DIAGNOSIS — R2689 Other abnormalities of gait and mobility: Secondary | ICD-10-CM | POA: Diagnosis not present

## 2023-11-27 DIAGNOSIS — S065X9S Traumatic subdural hemorrhage with loss of consciousness of unspecified duration, sequela: Secondary | ICD-10-CM | POA: Diagnosis not present

## 2023-11-27 DIAGNOSIS — C651 Malignant neoplasm of right renal pelvis: Secondary | ICD-10-CM | POA: Diagnosis not present

## 2023-11-27 DIAGNOSIS — E782 Mixed hyperlipidemia: Secondary | ICD-10-CM | POA: Diagnosis not present

## 2023-11-27 DIAGNOSIS — R4701 Aphasia: Secondary | ICD-10-CM | POA: Diagnosis not present

## 2023-11-27 DIAGNOSIS — R32 Unspecified urinary incontinence: Secondary | ICD-10-CM | POA: Diagnosis not present

## 2023-11-27 DIAGNOSIS — L89322 Pressure ulcer of left buttock, stage 2: Secondary | ICD-10-CM | POA: Diagnosis not present

## 2023-11-27 DIAGNOSIS — R531 Weakness: Secondary | ICD-10-CM | POA: Diagnosis not present

## 2023-11-27 DIAGNOSIS — X58XXXS Exposure to other specified factors, sequela: Secondary | ICD-10-CM | POA: Diagnosis not present

## 2023-11-27 DIAGNOSIS — L89312 Pressure ulcer of right buttock, stage 2: Secondary | ICD-10-CM | POA: Diagnosis not present

## 2023-11-27 DIAGNOSIS — Z993 Dependence on wheelchair: Secondary | ICD-10-CM | POA: Diagnosis not present

## 2023-11-27 DIAGNOSIS — R159 Full incontinence of feces: Secondary | ICD-10-CM | POA: Diagnosis not present

## 2023-12-01 DIAGNOSIS — R531 Weakness: Secondary | ICD-10-CM | POA: Diagnosis not present

## 2023-12-01 DIAGNOSIS — R32 Unspecified urinary incontinence: Secondary | ICD-10-CM | POA: Diagnosis not present

## 2023-12-01 DIAGNOSIS — R4701 Aphasia: Secondary | ICD-10-CM | POA: Diagnosis not present

## 2023-12-01 DIAGNOSIS — R2689 Other abnormalities of gait and mobility: Secondary | ICD-10-CM | POA: Diagnosis not present

## 2023-12-01 DIAGNOSIS — S065X9S Traumatic subdural hemorrhage with loss of consciousness of unspecified duration, sequela: Secondary | ICD-10-CM | POA: Diagnosis not present

## 2023-12-01 DIAGNOSIS — Z993 Dependence on wheelchair: Secondary | ICD-10-CM | POA: Diagnosis not present

## 2023-12-01 DIAGNOSIS — L89322 Pressure ulcer of left buttock, stage 2: Secondary | ICD-10-CM | POA: Diagnosis not present

## 2023-12-01 DIAGNOSIS — R159 Full incontinence of feces: Secondary | ICD-10-CM | POA: Diagnosis not present

## 2023-12-01 DIAGNOSIS — L89312 Pressure ulcer of right buttock, stage 2: Secondary | ICD-10-CM | POA: Diagnosis not present

## 2023-12-01 DIAGNOSIS — X58XXXS Exposure to other specified factors, sequela: Secondary | ICD-10-CM | POA: Diagnosis not present

## 2023-12-01 DIAGNOSIS — E782 Mixed hyperlipidemia: Secondary | ICD-10-CM | POA: Diagnosis not present

## 2023-12-01 DIAGNOSIS — C651 Malignant neoplasm of right renal pelvis: Secondary | ICD-10-CM | POA: Diagnosis not present

## 2023-12-02 DIAGNOSIS — R64 Cachexia: Secondary | ICD-10-CM | POA: Diagnosis not present

## 2023-12-02 DIAGNOSIS — C679 Malignant neoplasm of bladder, unspecified: Secondary | ICD-10-CM | POA: Diagnosis not present

## 2023-12-02 DIAGNOSIS — R9341 Abnormal radiologic findings on diagnostic imaging of renal pelvis, ureter, or bladder: Secondary | ICD-10-CM | POA: Diagnosis not present

## 2023-12-02 DIAGNOSIS — N131 Hydronephrosis with ureteral stricture, not elsewhere classified: Secondary | ICD-10-CM | POA: Diagnosis not present

## 2023-12-02 DIAGNOSIS — I69320 Aphasia following cerebral infarction: Secondary | ICD-10-CM | POA: Diagnosis not present

## 2023-12-02 DIAGNOSIS — C779 Secondary and unspecified malignant neoplasm of lymph node, unspecified: Secondary | ICD-10-CM | POA: Diagnosis not present

## 2023-12-02 DIAGNOSIS — Z79899 Other long term (current) drug therapy: Secondary | ICD-10-CM | POA: Diagnosis not present

## 2023-12-02 DIAGNOSIS — N1330 Unspecified hydronephrosis: Secondary | ICD-10-CM | POA: Diagnosis not present

## 2023-12-02 DIAGNOSIS — Z993 Dependence on wheelchair: Secondary | ICD-10-CM | POA: Diagnosis not present

## 2023-12-02 DIAGNOSIS — Z7982 Long term (current) use of aspirin: Secondary | ICD-10-CM | POA: Diagnosis not present

## 2023-12-02 DIAGNOSIS — C787 Secondary malignant neoplasm of liver and intrahepatic bile duct: Secondary | ICD-10-CM | POA: Diagnosis not present

## 2023-12-02 DIAGNOSIS — R530 Neoplastic (malignant) related fatigue: Secondary | ICD-10-CM | POA: Diagnosis not present

## 2023-12-02 DIAGNOSIS — E785 Hyperlipidemia, unspecified: Secondary | ICD-10-CM | POA: Diagnosis not present

## 2023-12-02 DIAGNOSIS — Z85841 Personal history of malignant neoplasm of brain: Secondary | ICD-10-CM | POA: Diagnosis not present

## 2023-12-02 DIAGNOSIS — Z905 Acquired absence of kidney: Secondary | ICD-10-CM | POA: Diagnosis not present

## 2023-12-02 DIAGNOSIS — E860 Dehydration: Secondary | ICD-10-CM | POA: Diagnosis not present

## 2023-12-02 DIAGNOSIS — N179 Acute kidney failure, unspecified: Secondary | ICD-10-CM | POA: Diagnosis not present

## 2023-12-02 DIAGNOSIS — I69354 Hemiplegia and hemiparesis following cerebral infarction affecting left non-dominant side: Secondary | ICD-10-CM | POA: Diagnosis not present

## 2023-12-02 DIAGNOSIS — N135 Crossing vessel and stricture of ureter without hydronephrosis: Secondary | ICD-10-CM | POA: Diagnosis not present

## 2023-12-04 DIAGNOSIS — M89252 Other disorders of bone development and growth, left femur: Secondary | ICD-10-CM | POA: Diagnosis not present

## 2023-12-04 DIAGNOSIS — R338 Other retention of urine: Secondary | ICD-10-CM | POA: Diagnosis not present

## 2023-12-04 DIAGNOSIS — Z436 Encounter for attention to other artificial openings of urinary tract: Secondary | ICD-10-CM | POA: Diagnosis not present

## 2023-12-04 DIAGNOSIS — I69354 Hemiplegia and hemiparesis following cerebral infarction affecting left non-dominant side: Secondary | ICD-10-CM | POA: Diagnosis not present

## 2023-12-04 DIAGNOSIS — N135 Crossing vessel and stricture of ureter without hydronephrosis: Secondary | ICD-10-CM | POA: Diagnosis not present

## 2023-12-04 DIAGNOSIS — C772 Secondary and unspecified malignant neoplasm of intra-abdominal lymph nodes: Secondary | ICD-10-CM | POA: Diagnosis not present

## 2023-12-04 DIAGNOSIS — C786 Secondary malignant neoplasm of retroperitoneum and peritoneum: Secondary | ICD-10-CM | POA: Diagnosis not present

## 2023-12-04 DIAGNOSIS — C787 Secondary malignant neoplasm of liver and intrahepatic bile duct: Secondary | ICD-10-CM | POA: Diagnosis not present

## 2023-12-04 DIAGNOSIS — C771 Secondary and unspecified malignant neoplasm of intrathoracic lymph nodes: Secondary | ICD-10-CM | POA: Diagnosis not present

## 2023-12-04 DIAGNOSIS — C679 Malignant neoplasm of bladder, unspecified: Secondary | ICD-10-CM | POA: Diagnosis not present

## 2023-12-04 DIAGNOSIS — I700 Atherosclerosis of aorta: Secondary | ICD-10-CM | POA: Diagnosis not present

## 2023-12-04 DIAGNOSIS — E785 Hyperlipidemia, unspecified: Secondary | ICD-10-CM | POA: Diagnosis not present

## 2023-12-04 DIAGNOSIS — I119 Hypertensive heart disease without heart failure: Secondary | ICD-10-CM | POA: Diagnosis not present

## 2023-12-04 DIAGNOSIS — I69320 Aphasia following cerebral infarction: Secondary | ICD-10-CM | POA: Diagnosis not present

## 2023-12-04 DIAGNOSIS — N179 Acute kidney failure, unspecified: Secondary | ICD-10-CM | POA: Diagnosis not present

## 2023-12-04 DIAGNOSIS — G4733 Obstructive sleep apnea (adult) (pediatric): Secondary | ICD-10-CM | POA: Diagnosis not present

## 2023-12-04 DIAGNOSIS — A000 Cholera due to Vibrio cholerae 01, biovar cholerae: Secondary | ICD-10-CM | POA: Diagnosis not present

## 2024-01-20 DEATH — deceased
